# Patient Record
Sex: Female | Born: 1965
Health system: Southern US, Community
[De-identification: ages and names within clinical notes are randomized; demographics above are authoritative.]

## PROBLEM LIST (undated history)

## (undated) DIAGNOSIS — N179 Acute kidney failure, unspecified: Secondary | ICD-10-CM

## (undated) DIAGNOSIS — L299 Pruritus, unspecified: Secondary | ICD-10-CM

## (undated) DIAGNOSIS — S62102A Fracture of unspecified carpal bone, left wrist, initial encounter for closed fracture: Secondary | ICD-10-CM

## (undated) DIAGNOSIS — G8194 Hemiplegia, unspecified affecting left nondominant side: Secondary | ICD-10-CM

## (undated) DIAGNOSIS — I639 Cerebral infarction, unspecified: Secondary | ICD-10-CM

## (undated) DIAGNOSIS — K5901 Slow transit constipation: Secondary | ICD-10-CM

## (undated) HISTORY — DX: Acute kidney failure, unspecified: N17.9

## (undated) HISTORY — DX: Slow transit constipation: K59.01

## (undated) HISTORY — DX: Hemiplegia, unspecified affecting left nondominant side: G81.94

---

## 2017-02-28 ENCOUNTER — Inpatient Hospital Stay (HOSPITAL_COMMUNITY)
Admission: EM | Admit: 2017-02-28 | Discharge: 2017-03-05 | DRG: 041 | Disposition: A | Discharge status: Rehabilitation Facility | Payer: 59 | Attending: Family Medicine | Admitting: Family Medicine

## 2017-02-28 ENCOUNTER — Emergency Department (HOSPITAL_COMMUNITY): Payer: 59

## 2017-02-28 ENCOUNTER — Encounter (HOSPITAL_COMMUNITY): Payer: Self-pay | Admitting: Emergency Medicine

## 2017-02-28 ENCOUNTER — Inpatient Hospital Stay (HOSPITAL_COMMUNITY): Payer: 59

## 2017-02-28 DIAGNOSIS — I639 Cerebral infarction, unspecified: Secondary | ICD-10-CM | POA: Diagnosis present

## 2017-02-28 DIAGNOSIS — Z8249 Family history of ischemic heart disease and other diseases of the circulatory system: Secondary | ICD-10-CM | POA: Diagnosis not present

## 2017-02-28 DIAGNOSIS — K5900 Constipation, unspecified: Secondary | ICD-10-CM | POA: Diagnosis not present

## 2017-02-28 DIAGNOSIS — R1310 Dysphagia, unspecified: Secondary | ICD-10-CM | POA: Diagnosis present

## 2017-02-28 DIAGNOSIS — K5901 Slow transit constipation: Secondary | ICD-10-CM | POA: Diagnosis not present

## 2017-02-28 DIAGNOSIS — I69398 Other sequelae of cerebral infarction: Secondary | ICD-10-CM | POA: Diagnosis not present

## 2017-02-28 DIAGNOSIS — R414 Neurologic neglect syndrome: Secondary | ICD-10-CM | POA: Diagnosis present

## 2017-02-28 DIAGNOSIS — I63311 Cerebral infarction due to thrombosis of right middle cerebral artery: Secondary | ICD-10-CM | POA: Diagnosis present

## 2017-02-28 DIAGNOSIS — R402142 Coma scale, eyes open, spontaneous, at arrival to emergency department: Secondary | ICD-10-CM | POA: Diagnosis present

## 2017-02-28 DIAGNOSIS — R269 Unspecified abnormalities of gait and mobility: Secondary | ICD-10-CM | POA: Diagnosis not present

## 2017-02-28 DIAGNOSIS — E785 Hyperlipidemia, unspecified: Secondary | ICD-10-CM

## 2017-02-28 DIAGNOSIS — R402362 Coma scale, best motor response, obeys commands, at arrival to emergency department: Secondary | ICD-10-CM | POA: Diagnosis present

## 2017-02-28 DIAGNOSIS — I6389 Other cerebral infarction: Secondary | ICD-10-CM | POA: Diagnosis not present

## 2017-02-28 DIAGNOSIS — N179 Acute kidney failure, unspecified: Secondary | ICD-10-CM | POA: Diagnosis not present

## 2017-02-28 DIAGNOSIS — I10 Essential (primary) hypertension: Secondary | ICD-10-CM | POA: Diagnosis present

## 2017-02-28 DIAGNOSIS — G8194 Hemiplegia, unspecified affecting left nondominant side: Secondary | ICD-10-CM | POA: Diagnosis present

## 2017-02-28 DIAGNOSIS — R531 Weakness: Secondary | ICD-10-CM | POA: Diagnosis present

## 2017-02-28 DIAGNOSIS — R402252 Coma scale, best verbal response, oriented, at arrival to emergency department: Secondary | ICD-10-CM | POA: Diagnosis present

## 2017-02-28 DIAGNOSIS — R4189 Other symptoms and signs involving cognitive functions and awareness: Secondary | ICD-10-CM | POA: Diagnosis present

## 2017-02-28 DIAGNOSIS — I63511 Cerebral infarction due to unspecified occlusion or stenosis of right middle cerebral artery: Secondary | ICD-10-CM | POA: Diagnosis not present

## 2017-02-28 DIAGNOSIS — L299 Pruritus, unspecified: Secondary | ICD-10-CM | POA: Diagnosis present

## 2017-02-28 DIAGNOSIS — M7989 Other specified soft tissue disorders: Secondary | ICD-10-CM | POA: Diagnosis not present

## 2017-02-28 DIAGNOSIS — Q211 Atrial septal defect: Secondary | ICD-10-CM

## 2017-02-28 DIAGNOSIS — R29810 Facial weakness: Secondary | ICD-10-CM | POA: Diagnosis present

## 2017-02-28 DIAGNOSIS — H53462 Homonymous bilateral field defects, left side: Secondary | ICD-10-CM | POA: Diagnosis present

## 2017-02-28 DIAGNOSIS — I69319 Unspecified symptoms and signs involving cognitive functions following cerebral infarction: Secondary | ICD-10-CM

## 2017-02-28 DIAGNOSIS — R29715 NIHSS score 15: Secondary | ICD-10-CM | POA: Diagnosis present

## 2017-02-28 DIAGNOSIS — E119 Type 2 diabetes mellitus without complications: Secondary | ICD-10-CM | POA: Diagnosis present

## 2017-02-28 DIAGNOSIS — I69354 Hemiplegia and hemiparesis following cerebral infarction affecting left non-dominant side: Secondary | ICD-10-CM | POA: Diagnosis not present

## 2017-02-28 DIAGNOSIS — Z603 Acculturation difficulty: Secondary | ICD-10-CM | POA: Diagnosis not present

## 2017-02-28 DIAGNOSIS — E139 Other specified diabetes mellitus without complications: Secondary | ICD-10-CM | POA: Diagnosis not present

## 2017-02-28 HISTORY — DX: Pruritus, unspecified: L29.9

## 2017-02-28 HISTORY — DX: Cerebral infarction, unspecified: I63.9

## 2017-02-28 HISTORY — DX: Fracture of unspecified carpal bone, left wrist, initial encounter for closed fracture: S62.102A

## 2017-02-28 LAB — URINALYSIS, ROUTINE W REFLEX MICROSCOPIC
BILIRUBIN URINE: NEGATIVE
Glucose, UA: NEGATIVE mg/dL
KETONES UR: 5 mg/dL — AB
LEUKOCYTES UA: NEGATIVE
Nitrite: NEGATIVE
PROTEIN: 100 mg/dL — AB
Specific Gravity, Urine: >1.046 — ABNORMAL HIGH (ref 1.005–1.030)
pH: 7 (ref 5.0–8.0)

## 2017-02-28 LAB — COMPREHENSIVE METABOLIC PANEL
ALT: 16 U/L (ref 14–54)
ANION GAP: 13 (ref 5–15)
AST: 21 U/L (ref 15–41)
Albumin: 3.8 g/dL (ref 3.5–5.0)
Alkaline Phosphatase: 76 U/L (ref 38–126)
BILIRUBIN TOTAL: 0.6 mg/dL (ref 0.3–1.2)
BUN: 10 mg/dL (ref 6–20)
CO2: 24 mmol/L (ref 22–32)
Calcium: 9.6 mg/dL (ref 8.9–10.3)
Chloride: 102 mmol/L (ref 101–111)
Creatinine, Ser: 1.02 mg/dL — ABNORMAL HIGH (ref 0.44–1.00)
Glucose, Bld: 193 mg/dL — ABNORMAL HIGH (ref 65–99)
POTASSIUM: 4.4 mmol/L (ref 3.5–5.1)
Sodium: 139 mmol/L (ref 135–145)
TOTAL PROTEIN: 8 g/dL (ref 6.5–8.1)

## 2017-02-28 LAB — DIFFERENTIAL
Basophils Absolute: 0 10*3/uL (ref 0.0–0.1)
Basophils Relative: 0 %
EOS ABS: 0 10*3/uL (ref 0.0–0.7)
EOS PCT: 0 %
LYMPHS ABS: 1.8 10*3/uL (ref 0.7–4.0)
Lymphocytes Relative: 19 %
Monocytes Absolute: 0.4 10*3/uL (ref 0.1–1.0)
Monocytes Relative: 4 %
Neutro Abs: 7.3 10*3/uL (ref 1.7–7.7)
Neutrophils Relative %: 77 %

## 2017-02-28 LAB — I-STAT CHEM 8, ED
BUN: 15 mg/dL (ref 6–20)
Calcium, Ion: 1.07 mmol/L — ABNORMAL LOW (ref 1.15–1.40)
Chloride: 104 mmol/L (ref 101–111)
Creatinine, Ser: 0.9 mg/dL (ref 0.44–1.00)
Glucose, Bld: 194 mg/dL — ABNORMAL HIGH (ref 65–99)
HEMATOCRIT: 52 % — AB (ref 36.0–46.0)
HEMOGLOBIN: 17.7 g/dL — AB (ref 12.0–15.0)
Potassium: 5.2 mmol/L — ABNORMAL HIGH (ref 3.5–5.1)
SODIUM: 139 mmol/L (ref 135–145)
TCO2: 27 mmol/L (ref 22–32)

## 2017-02-28 LAB — CBC
HCT: 49.9 % — ABNORMAL HIGH (ref 36.0–46.0)
Hemoglobin: 16.1 g/dL — ABNORMAL HIGH (ref 12.0–15.0)
MCH: 25 pg — AB (ref 26.0–34.0)
MCHC: 32.3 g/dL (ref 30.0–36.0)
MCV: 77.4 fL — ABNORMAL LOW (ref 78.0–100.0)
Platelets: 221 10*3/uL (ref 150–400)
RBC: 6.45 MIL/uL — ABNORMAL HIGH (ref 3.87–5.11)
RDW: 14.4 % (ref 11.5–15.5)
WBC: 9.5 10*3/uL (ref 4.0–10.5)

## 2017-02-28 LAB — RAPID URINE DRUG SCREEN, HOSP PERFORMED
Amphetamines: NOT DETECTED
Barbiturates: NOT DETECTED
Benzodiazepines: NOT DETECTED
COCAINE: NOT DETECTED
OPIATES: NOT DETECTED
Tetrahydrocannabinol: NOT DETECTED

## 2017-02-28 LAB — PROTIME-INR
INR: 0.96
PROTHROMBIN TIME: 12.6 s (ref 11.4–15.2)

## 2017-02-28 LAB — ETHANOL: Alcohol, Ethyl (B): <10 mg/dL (ref <10)

## 2017-02-28 LAB — I-STAT TROPONIN, ED: TROPONIN I, POC: 0 ng/mL (ref 0.00–0.08)

## 2017-02-28 LAB — I-STAT BETA HCG BLOOD, ED (MC, WL, AP ONLY): I-stat hCG, quantitative: 6.6 m[IU]/mL — ABNORMAL HIGH (ref <5)

## 2017-02-28 LAB — HCG, QUANTITATIVE, PREGNANCY: HCG, BETA CHAIN, QUANT, S: 5 m[IU]/mL — AB (ref <5)

## 2017-02-28 LAB — APTT: aPTT: 33 seconds (ref 24–36)

## 2017-02-28 MED ORDER — ATORVASTATIN CALCIUM 80 MG PO TABS
80.0000 mg | ORAL_TABLET | Freq: Every day | ORAL | Status: DC
  Administered 2017-03-01 – 2017-03-05 (×5): 80 mg via ORAL
  Filled 2017-02-28 (×5): qty 1

## 2017-02-28 MED ORDER — ACETAMINOPHEN 650 MG RE SUPP
650.0000 mg | Freq: Four times a day (QID) | RECTAL | Status: DC | PRN
  Administered 2017-02-28 – 2017-03-01 (×3): 650 mg via RECTAL
  Filled 2017-02-28 (×3): qty 1

## 2017-02-28 MED ORDER — ONDANSETRON HCL 4 MG PO TABS: 4.0000 mg | ORAL_TABLET | Freq: Four times a day (QID) | ORAL | Status: DC | PRN

## 2017-02-28 MED ORDER — ASPIRIN 300 MG RE SUPP
300.0000 mg | Freq: Every day | RECTAL | Status: DC
  Filled 2017-02-28: qty 1

## 2017-02-28 MED ORDER — ENOXAPARIN SODIUM 30 MG/0.3ML ~~LOC~~ SOLN
30.0000 mg | SUBCUTANEOUS | Status: DC
  Administered 2017-02-28 – 2017-03-05 (×6): 30 mg via SUBCUTANEOUS
  Filled 2017-02-28 (×6): qty 0.3

## 2017-02-28 MED ORDER — ONDANSETRON HCL 4 MG/2ML IJ SOLN: 4.0000 mg | Freq: Four times a day (QID) | INTRAMUSCULAR | Status: DC | PRN

## 2017-02-28 MED ORDER — IOPAMIDOL (ISOVUE-370) INJECTION 76%
INTRAVENOUS | Status: AC
  Administered 2017-02-28: 90 mL
  Filled 2017-02-28: qty 100

## 2017-02-28 MED ORDER — ASPIRIN 325 MG PO TABS
325.0000 mg | ORAL_TABLET | Freq: Every day | ORAL | Status: DC
  Administered 2017-03-01 – 2017-03-04 (×4): 325 mg via ORAL
  Filled 2017-02-28 (×5): qty 1

## 2017-02-28 MED ORDER — ACETAMINOPHEN 325 MG PO TABS
650.0000 mg | ORAL_TABLET | Freq: Four times a day (QID) | ORAL | Status: DC | PRN
  Administered 2017-03-01 (×2): 650 mg via ORAL
  Filled 2017-02-28 (×2): qty 2

## 2017-02-28 MED ORDER — STROKE: EARLY STAGES OF RECOVERY BOOK
Freq: Once | Status: AC
  Administered 2017-02-28: 12:00:00
  Filled 2017-02-28: qty 1

## 2017-02-28 NOTE — ED Provider Notes (Signed)
[facility]ENC[facility] Provider Note   CSN: [id number] Arrival date & time: 02/28/17  0841     History   Chief Complaint Chief Complaint  Patient presents with  . Code Stroke    HPI Kristen Short is a 52 y.o. female.  Patient [place] in the [place] area.  Parent complaining of headache on Saturday.  Brought up to this area because she is moderately hard be it needs to sent.  They got worried that something was wrong so she was brought up here.  They realize that she had no movement to her left side.  Family states that apparently everything was okay until 3:00 yesterday afternoon.  Patient will not look to the left side.  Patient not able to walk.  Patient is able to verbalize.  Using family as Nurse, learning disabilitytranslator.  There is no [employer] engine[employer] translator for their language.      History reviewed. No pertinent past medical history.  There are no active problems to display for this patient.   History reviewed. No pertinent surgical history.  OB History    No data available       Home Medications    Prior to Admission medications   Medication Sig Start Date End Date Taking? Authorizing Provider  acetaminophen (TYLENOL) 500 MG tablet Take 500 mg by mouth every 6 (six) hours as needed for mild pain.   Yes [provider]    Family History No family history on file.  Social History Social History   Tobacco Use  . Smoking status: Not on file  Substance Use Topics  . Alcohol use: Not on file  . Drug use: Not on file     Allergies   Patient has no known allergies.   Review of Systems Review of Systems  Unable to perform ROS: Acuity of condition     Physical Exam Updated Vital Signs BP (!) 150/81   Pulse 92   Temp 98.3 F (36.8 C)   Resp 16   LMP 01/29/2017 Comment: entire abdomen shield  SpO2 97%   Physical Exam  Constitutional: She appears well-developed and well-nourished. No distress.  HENT:  Head: Normocephalic and  atraumatic.  Mouth/Throat: Oropharynx is clear and moist.  Eyes: Conjunctivae are normal. Pupils are equal, round, and reactive to light.  Best we can tell no vision to the left side.  Patient will not track her eyes to the left side.  Vision and eyes movement intact to the right.  Neck: Normal range of motion. Neck supple.  Cardiovascular: Normal rate, regular rhythm and normal heart sounds.  Pulmonary/Chest: Effort normal and breath sounds normal. No respiratory distress.  Abdominal: Soft. Bowel sounds are normal. There is no tenderness.  Musculoskeletal: Normal range of motion. She exhibits no edema.  Neurological: She is alert.  Patient with no movement to left arm.  Minimal movement to left leg.  Facial droop.  Left-sided neglect.  Patient able to toe verbalize.  Right side motor strength intact and normal.  Skin: Skin is warm. Capillary refill takes less than 2 seconds.  Nursing note and vitals reviewed.    ED Treatments / Results  Labs (all labs ordered are listed, but only abnormal results are displayed) Labs Reviewed  I-STAT CHEM 8, ED - Abnormal; Notable for the following components:      Result Value   Potassium 5.2 (*)    Glucose, Bld 194 (*)    Calcium, Ion 1.07 (*)    Hemoglobin 17.7 (*)  HCT 52.0 (*)    All other components within normal limits  I-STAT BETA HCG BLOOD, ED ([facility], [facility], [facility] ONLY) - Abnormal; Notable for the following components:   I-stat hCG, quantitative 6.6 (*)    All other components within normal limits  ETHANOL  PROTIME-INR  APTT  CBC  DIFFERENTIAL  COMPREHENSIVE METABOLIC PANEL  RAPID URINE DRUG SCREEN, HOSP PERFORMED  URINALYSIS, ROUTINE W REFLEX MICROSCOPIC  HCG, QUANTITATIVE, PREGNANCY  I-STAT TROPONIN, ED    EKG  EKG Interpretation  Date/Time:  Wednesday February 28 2017 09:55:43 EST Ventricular Rate:  96 PR Interval:    QRS Duration: 93 QT Interval:  350 QTC Calculation: 443 R Axis:   76 Text Interpretation:  Sinus rhythm  Borderline prolonged PR interval Borderline repolarization abnormality No previous ECGs available Confirmed by Vanetta Mulders [phone]) on 02/28/2017 10:00:04 AM       Radiology Ct Angio Head W Or Wo Contrast  Result Date: 02/28/2017 CLINICAL DATA:  51 year old female, code stroke. Left side paralysis, unknown time of onset. EXAM: CT ANGIOGRAPHY HEAD AND NECK CT PERFUSION BRAIN TECHNIQUE: Multidetector CT imaging of the head and neck was performed using the standard protocol during bolus administration of intravenous contrast. Multiplanar CT image reconstructions and MIPs were obtained to evaluate the vascular anatomy. Carotid stenosis measurements (when applicable) are obtained utilizing NASCET criteria, using the distal internal carotid diameter as the denominator. Multiphase CT imaging of the brain was performed following IV bolus contrast injection. Subsequent parametric perfusion maps were calculated using RAPID software. CONTRAST:  90mL ISOVUE-370 IOPAMIDOL (ISOVUE-370) INJECTION 76% COMPARISON:  Head CT without contrast 0935 hr today. FINDINGS: CT Brain Perfusion Findings: CBF (<30%) Volume: 7 mL, but underestimated due to hypodense areas of core infarct. Perfusion (Tmax>6.0s) volume: 0 milliliters Mismatch Volume: Not applicable Infarction Location:Right hemisphere. CTA NECK Skeleton: No acute osseous abnormality identified. Upper chest: Mild gas trapping in the upper lobes. No superior mediastinal lymphadenopathy. Other neck: Negative.  No cervical lymphadenopathy. Aortic arch: 3 vessel arch configuration. Mild motion artifact. No arch atherosclerosis or great vessel origin stenosis suspected. Right carotid system: Mild motion artifact at the right CCA origin without stenosis. No atherosclerosis identified in the cervical right carotid. Widely patent right ICA origin and bulb. Left carotid system: Mild motion artifact at the left CCA origin. Minimal if any atherosclerosis in the left cervical  carotid. Widely patent left ICA origin and bulb. Vertebral arteries: No proximal right subclavian artery or right vertebral artery origin plaque or stenosis. Patent right vertebral artery to the skull base without stenosis. Mild motion artifact at the proximal subclavian artery. Normal left vertebral artery origin. Tortuous left V1 segment. Fairly codominant vertebral arteries in the neck. No plaque or stenosis in the left vertebral artery to the skull base. CTA HEAD Posterior circulation: Codominant distal vertebral arteries are patent to the vertebrobasilar junction without stenosis. Patent PICA origins. Patent basilar artery without stenosis. Normal SCA and PCA origins. Posterior communicating arteries are diminutive or absent. Bilateral PCA branches are symmetric and within normal limits. Anterior circulation: The left ICA siphon is patent with mild to moderate calcified plaque in the cavernous and supraclinoid ICA segments. Possible mild left supraclinoid segment stenosis. Patent left ICA terminus. Calcified plaque plus soft plaque or thrombus in the right ICA siphon. Short segment (3 millimeter) occlusion of the distal cavernous right ICA (series 11, image 91) with reconstituted flow at the right anterior genu and patent right supraclinoid segment. Superimposed mild to moderate right siphon atherosclerotic stenosis. Patent right  ICA terminus. Normal MCA origins. Questionable moderate stenosis of the right ACA origin. Bilateral ACA branches are within normal limits. Left MCA M1 segment, bifurcation, and left MCA branches are within normal limits. Right MCA, M1 segment bifurcation, and right MCA branches are within normal limits. No right MCA branch occlusion identified. Venous sinuses: Arterial dominant phase imaging. Grossly patent major dural venous sinuses. Anatomic variants: None. Review of the MIP images confirms the above findings IMPRESSION: 1. Positive for large vessel occlusion of the right ICA siphon:  there is a short 3 mm distal cavernous segment of right ICA thrombosis with reconstitution. Patent right ICA terminus and right MCA. 2. Right MCA core infarct volume underestimated by CT Perfusion, suggesting subacute ischemic timing. 3. Moderate bilateral ICA siphon atherosclerosis associated with #1. But no carotid atherosclerosis or stenosis in the neck. And no posterior circulation atherosclerosis or stenosis. 4. Study was reviewed in person with Dr. Milon Dikes on 02/28/2017 beginning at 1000 hrs. Electronically Signed   By: Odessa Fleming M.D.   On: 02/28/2017 10:19   Ct Angio Neck W Or Wo Contrast  Result Date: 02/28/2017 CLINICAL DATA:  52 year old female, code stroke. Left side paralysis, unknown time of onset. EXAM: CT ANGIOGRAPHY HEAD AND NECK CT PERFUSION BRAIN TECHNIQUE: Multidetector CT imaging of the head and neck was performed using the standard protocol during bolus administration of intravenous contrast. Multiplanar CT image reconstructions and MIPs were obtained to evaluate the vascular anatomy. Carotid stenosis measurements (when applicable) are obtained utilizing NASCET criteria, using the distal internal carotid diameter as the denominator. Multiphase CT imaging of the brain was performed following IV bolus contrast injection. Subsequent parametric perfusion maps were calculated using RAPID software. CONTRAST:  90mL ISOVUE-370 IOPAMIDOL (ISOVUE-370) INJECTION 76% COMPARISON:  Head CT without contrast 0935 hr today. FINDINGS: CT Brain Perfusion Findings: CBF (<30%) Volume: 7 mL, but underestimated due to hypodense areas of core infarct. Perfusion (Tmax>6.0s) volume: 0 milliliters Mismatch Volume: Not applicable Infarction Location:Right hemisphere. CTA NECK Skeleton: No acute osseous abnormality identified. Upper chest: Mild gas trapping in the upper lobes. No superior mediastinal lymphadenopathy. Other neck: Negative.  No cervical lymphadenopathy. Aortic arch: 3 vessel arch configuration. Mild  motion artifact. No arch atherosclerosis or great vessel origin stenosis suspected. Right carotid system: Mild motion artifact at the right CCA origin without stenosis. No atherosclerosis identified in the cervical right carotid. Widely patent right ICA origin and bulb. Left carotid system: Mild motion artifact at the left CCA origin. Minimal if any atherosclerosis in the left cervical carotid. Widely patent left ICA origin and bulb. Vertebral arteries: No proximal right subclavian artery or right vertebral artery origin plaque or stenosis. Patent right vertebral artery to the skull base without stenosis. Mild motion artifact at the proximal subclavian artery. Normal left vertebral artery origin. Tortuous left V1 segment. Fairly codominant vertebral arteries in the neck. No plaque or stenosis in the left vertebral artery to the skull base. CTA HEAD Posterior circulation: Codominant distal vertebral arteries are patent to the vertebrobasilar junction without stenosis. Patent PICA origins. Patent basilar artery without stenosis. Normal SCA and PCA origins. Posterior communicating arteries are diminutive or absent. Bilateral PCA branches are symmetric and within normal limits. Anterior circulation: The left ICA siphon is patent with mild to moderate calcified plaque in the cavernous and supraclinoid ICA segments. Possible mild left supraclinoid segment stenosis. Patent left ICA terminus. Calcified plaque plus soft plaque or thrombus in the right ICA siphon. Short segment (3 millimeter) occlusion of the distal cavernous  right ICA (series 11, image 91) with reconstituted flow at the right anterior genu and patent right supraclinoid segment. Superimposed mild to moderate right siphon atherosclerotic stenosis. Patent right ICA terminus. Normal MCA origins. Questionable moderate stenosis of the right ACA origin. Bilateral ACA branches are within normal limits. Left MCA M1 segment, bifurcation, and left MCA branches are  within normal limits. Right MCA, M1 segment bifurcation, and right MCA branches are within normal limits. No right MCA branch occlusion identified. Venous sinuses: Arterial dominant phase imaging. Grossly patent major dural venous sinuses. Anatomic variants: None. Review of the MIP images confirms the above findings IMPRESSION: 1. 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 Patent right ICA terminus and right MCA. 2. Right MCA core infarct volume underestimated by CT Perfusion, suggesting subacute ischemic timing. 3. Moderate bilateral ICA siphon atherosclerosis associated with #1. But no carotid atherosclerosis or stenosis in the neck. And no posterior circulation atherosclerosis or stenosis. 4. Study was reviewed in person with Dr. Milon Dikes on 02/28/2017 beginning at 1000 hrs. Electronically Signed   By: Odessa Fleming M.D.   On: 02/28/2017 10:19   Ct Cerebral Perfusion W Contrast  Result Date: 02/28/2017 CLINICAL DATA:  52 year old female, code stroke. Left side paralysis, unknown time of onset. EXAM: CT ANGIOGRAPHY HEAD AND NECK CT PERFUSION BRAIN TECHNIQUE: Multidetector CT imaging of the head and neck was performed using the standard protocol during bolus administration of intravenous contrast. Multiplanar CT image reconstructions and MIPs were obtained to evaluate the vascular anatomy. Carotid stenosis measurements (when applicable) are obtained utilizing NASCET criteria, using the distal internal carotid diameter as the denominator. Multiphase CT imaging of the brain was performed following IV bolus contrast injection. Subsequent parametric perfusion maps were calculated using RAPID software. CONTRAST:  90mL ISOVUE-370 IOPAMIDOL (ISOVUE-370) INJECTION 76% COMPARISON:  Head CT without contrast 0935 hr today. FINDINGS: CT Brain Perfusion Findings: CBF (<30%) Volume: 7 mL, but underestimated due to  hypodense areas of core infarct. Perfusion (Tmax>6.0s) volume: 0 milliliters Mismatch Volume: Not applicable Infarction Location:Right hemisphere. CTA NECK Skeleton: No acute osseous abnormality identified. Upper chest: Mild gas trapping in the upper lobes. No superior mediastinal lymphadenopathy. Other neck: Negative.  No cervical lymphadenopathy. Aortic arch: 3 vessel arch configuration. Mild motion artifact. No arch atherosclerosis or great vessel origin stenosis suspected. Right carotid system: Mild motion artifact at the right CCA origin without stenosis. No atherosclerosis identified in the cervical right carotid. Widely patent right ICA origin and bulb. Left carotid system: Mild motion artifact at the left CCA origin. Minimal if any atherosclerosis in the left cervical carotid. Widely patent left ICA origin and bulb. Vertebral arteries: No proximal right subclavian artery or right vertebral artery origin plaque or stenosis. Patent right vertebral artery to the skull base without stenosis. Mild motion artifact at the proximal subclavian artery. Normal left vertebral artery origin. Tortuous left V1 segment. Fairly codominant vertebral arteries in the neck. No plaque or stenosis in the left vertebral artery to the skull base. CTA HEAD Posterior circulation: Codominant distal vertebral arteries are patent to the vertebrobasilar junction without stenosis. Patent PICA origins. Patent basilar artery without stenosis. Normal SCA and PCA origins. Posterior communicating arteries are diminutive or absent. Bilateral PCA branches are symmetric and within normal limits. Anterior circulation: The left ICA siphon is patent with mild to moderate calcified plaque in the cavernous and supraclinoid ICA segments. Possible mild left supraclinoid  segment stenosis. Patent left ICA terminus. Calcified plaque plus soft plaque or thrombus in the right ICA siphon. Short segment (3 millimeter) occlusion of the distal cavernous right ICA  (series 11, image 91) with reconstituted flow at the right anterior genu and patent right supraclinoid segment. Superimposed mild to moderate right siphon atherosclerotic stenosis. Patent right ICA terminus. Normal MCA origins. Questionable moderate stenosis of the right ACA origin. Bilateral ACA branches are within normal limits. Left MCA M1 segment, bifurcation, and left MCA branches are within normal limits. Right MCA, M1 segment bifurcation, and right MCA branches are within normal limits. No right MCA branch occlusion identified. Venous sinuses: Arterial dominant phase imaging. Grossly patent major dural venous sinuses. Anatomic variants: None. Review of the MIP images confirms the above findings IMPRESSION: 1. 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 Patent right ICA terminus and right MCA. 2. Right MCA core infarct volume underestimated by CT Perfusion, suggesting subacute ischemic timing. 3. Moderate bilateral ICA siphon atherosclerosis associated with #1. But no carotid atherosclerosis or stenosis in the neck. And no posterior circulation atherosclerosis or stenosis. 4. Study was reviewed in person with Dr. Milon Dikes on 02/28/2017 beginning at 1000 hrs. Electronically Signed   By: Odessa Fleming M.D.   On: 02/28/2017 10:19   Ct Head Code Stroke Wo Contrast  Result Date: 02/28/2017 CLINICAL DATA:  Code stroke. 52 year old female with left side paralysis. Unknown time of onset. EXAM: CT HEAD WITHOUT CONTRAST TECHNIQUE: Contiguous axial images were obtained from the base of the skull through the vertex without intravenous contrast. COMPARISON:  None. FINDINGS: Brain: Multifocal confluent areas of gray and white matter hypodensity are scattered in the right hemisphere, with wedge-shaped peripheral areas of involvement in the right middle frontal gyrus, right parietal lobe, and superior right occipital  lobe. Additional smaller areas of involvement including the superior right peri-Rolandic cortex (series 3, image 25). No acute intracranial hemorrhage identified. No associated intracranial mass effect. Gray-white matter differentiation in the right temporal lobe and right basal ganglia is preserved. Left hemisphere, thalamic, and posterior fossa gray-white matter differentiation appears normal. No ventriculomegaly.  Normal basilar cisterns. Vascular: Calcified atherosclerosis at the skull base. No suspicious intracranial vascular hyperdensity. Skull: Negative. Sinuses/Orbits: Clear. Other: Visualized orbits and scalp soft tissues are within normal limits. ASPECTS [facility] Stroke Program Early CT Score) - Ganglionic level infarction (caudate, lentiform nuclei, internal capsule, insula, M1-M3 cortex): 6 (abnormal M1 segment) - Supraganglionic infarction (M4-M6 cortex): 0 (M4 through and 6 abnormal). Total score (0-10 with 10 being normal): 6 IMPRESSION: 1. Multifocal subacute appearing cortical infarcts in the right hemisphere, primarily the right MCA territory although there is also superior right occipital lobe involvement. The right basal ganglia are spared. 2. No associated hemorrhage or intracranial mass effect. 3. ASPECTS is 6. 4. These results were communicated to Dr. Wilford Corner at 9:46 amon 1/23/2019by text page via the [facility] messaging system. Electronically Signed   By: Odessa Fleming M.D.   On: 02/28/2017 09:47    Procedures Procedures (including critical care time)  CRITICAL CARE Performed by: Vanetta Mulders Total critical care time: 45 minutes Critical care time was exclusive of separately billable procedures and treating other patients. Critical care was necessary to treat or prevent imminent or life-threatening deterioration. Critical care was time spent personally by me on the following activities: development of treatment plan with patient and/or surrogate as well as nursing, discussions with  consultants, evaluation of patient's response to treatment, examination of patient, obtaining history from patient or surrogate, ordering and performing treatments and interventions, ordering and review of laboratory studies, ordering and review of radiographic studies, pulse oximetry and re-evaluation of patient's condition.    Medications Ordered in ED Medications  iopamidol (ISOVUE-370) 76 % injection (90 mLs  Contrast Given 02/28/17 0945)     Initial Impression / Assessment and Plan / ED Course  I have reviewed the triage vital signs and the nursing notes.  Pertinent labs & imaging results that were available during my care of the patient were reviewed by me and considered in my medical decision making (see chart for details).    Initially patient's history from family members.  Based on her [place] language no translator other than family.  They felt that she was last seen normal at 3:00 yesterday afternoon.  Initially there was some concern about a complaint of headache just starting on Saturday.  But otherwise was all right.  The patient presented here with evidence of large vessel stroke.  Patient had left-sided weakness neglect to the left side.  No vision left side.   Code stroke was activated.  Because of [place].  CT showed large right MCA infarct but it did appear subacute.  Probably occurred around Saturday.  Patient had CT angios done and CT perfusion done which kind of confirmed these findings.  There was involvement of the internal carotid artery.  Patient stable.  Chest x-ray added on because of her background.  Discussed with medicine teaching service they will come see patient and admit.  Patient's potassium was high on initial blood work.  This could be some degree of hemolysis renal functions normal.  Also her i-STAT pregnancy test was slightly elevated this is usually a false positive.  Quantitative hCG sent.  Patient's blood pressure here slightly  elevated systolics 150.   Final Clinical Impressions(s) / ED Diagnoses   Final diagnoses:  Cerebrovascular accident (CVA) due to thrombosis of right middle cerebral artery [facility])    ED Discharge Orders    None       Vanetta Mulders, MD 02/28/17 1037

## 2017-02-28 NOTE — ED Triage Notes (Signed)
Pt here from home with c/o stroke symptoms that started yesterday around 3om when pt could not walk , pt also had a really bad h/a on sat

## 2017-02-28 NOTE — Consult Note (Signed)
Neurology Consultation  Reason for Consult: code stroke Referring Physician: Dr Deretha Emory  CC: LSW  History is obtained from: chart, family  HPI: Kristen Short is a 52 y.o. female with no known PMH, recent immigrant from [place] who lives in [place] and the rest of the family lives in [place], was brought in for evaluation of left-sided weakness. She had been complaining of a headache that started around Saturday.  She describes it as a very severe pressure-like headache all over her head. Over the next day or so, she started having some difficulty walking.  Yesterday it was noted that around 3 PM she could not walk and had severe weakness in her leg. For the symptoms, she was driven by family to [facility] and brought to [facility] for further evaluation. Upon arrival here in the ER, she had left facial weakness, left arm facility, left leg weakness and right gaze preference. A code stroke was called because initial information was that her last known well was 3 PM yesterday whereas on more detailed history taking, it seems like the symptoms have been ongoing at least since Sunday. No recent illnesses.  No fever chills.  No chest pain palpitations.  No shortness of breath.  No nausea vomiting abdominal pain diarrhea constipation.   LKW: 1500 hrs, 02/28/2017 is what was told at first but seems like it was sometime on Sunday, 02/25/2017 tpa given?: no, OSW Premorbid modified Rankin scale (mRS):0  ROS: Review of systems positives as documented in the HPI.  Rest of the review negative.  No past medical history on file.   No family history on file.   Social History:   has no tobacco, alcohol, and drug history on file.  Medications  Current Facility-Administered Medications:  .  iopamidol (ISOVUE-370) 76 % injection, , , ,   Current Outpatient Medications:  .  acetaminophen (TYLENOL) 500 MG tablet, Take 500 mg by mouth every 6 (six) hours as needed for mild pain., Disp: ,  Rfl:   Exam: Current vital signs: There were no vitals taken for this visit. Vital signs in last 24 hours:   General: Awake alert no distress HEENT: Normocephalic atraumatic LUNGS - Clear to auscultation bilaterally with no wheezes CV - S1S2 RRR, no m/r/g, equal pulses bilaterally. ABDOMEN - Soft, nontender, nondistended with normoactive BS Ext: warm, well perfused, intact peripheral pulses, no edema NEURO:  Mental Status: AA&Ox3  Language: speech is dysarthric.  Naming, repetition, fluency, and comprehension intact. Cranial nerves: Pupils equal round reactive to light, gaze preference to the right, able to get to midline but unable to cross midline to the left, left homonymous hemianopsia, left lower facial weakness at rest and on smiling, decreased sensation on the left face, tongue midline. Motor exam: 0/5 left upper extremity, 3/5 left lower extremity with vertical drift, 5/5 right upper and lower extremities. Sensory: Diminished sensation on the left hemibody to all modalities.  Extinction on double simultaneous stimulation. Cerebellar function: Intact finger-nose-finger on the right, intact heel-knee-shin on the right, unable to perform on the left. Gait testing was deferred NIHSS - 15  Labs I have reviewed labs in epic and the results pertinent to this consultation are: CBC    Component Value Date/Time   HGB 17.7 (H) 02/28/2017 0941   HCT 52.0 (H) 02/28/2017 0941   CMP     Component Value Date/Time   NA 139 02/28/2017 0941   K 5.2 (H) 02/28/2017 0941   CL 104 02/28/2017 0941   GLUCOSE 194 (  H) 02/28/2017 0941   BUN 15 02/28/2017 0941   CREATININE 0.90 02/28/2017 0941   Imaging I have reviewed the images obtained: CT-scan of the brain -subacute appearing strokes in the right MCA territory and right PCA territory as well.  Aspects 6.  No bleed. 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 CT perfusion  study shows a small cord with no area at risk-likely spuriously  Assessment:  52 year old [place] woman with no known past medical history brought in for evaluation of left-sided weakness that started Saturday or Sunday. Symptoms consistent with a right MCA territory stroke. NIH 15 on arrival.  Not a candidate for TPA as she is out of the window.  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  Acute ischemic stroke involving the right MCA and PCA territory-atheroembolic versus cardioembolic.  Recommendations: -Admit to hospitalist -Telemetry monitoring -Allow for permissive hypertension for the first 24-48h - only treat PRN if SBP >220 mmHg. Blood pressures can be gradually normalized to SBP<140 upon discharge. -MRI brain without contrast -Echocardiogram -HgbA1c, fasting lipid panel -Frequent neuro checks -Prophylactic therapy-Antiplatelet med: Aspirin - dose 325mg  PO or 300mg  PR -Atorvastatin 80 mg PO daily -Risk factor modification -PT consult, OT consult, Speech consult -If Afib found on telemetry, will need anticoagulation. Decision pending imaging and stroke team rounding.  Please page stroke NP/PA/MD (listed on AMION)  from 8am-4 pm as this patient will be followed by the stroke team at this point.  -- Milon DikesAshish Ziere Docken, MD [facility] Pager: [phone] If 7pm to 7am, please call on call as listed on AMION.  CRITICAL CARE ATTESTATION This patient is critically ill and at significant risk of neurological worsening, death and care requires constant monitoring of vital signs, hemodynamics,respiratory and cardiac monitoring. I spent 50  minutes of neurocritical care time performing neurological assessment, discussion with family, other specialists and medical decision making of high complexityin the care of  this patient.

## 2017-02-28 NOTE — ED Notes (Signed)
[facility] called to cancel code stroke per Dr. Laurence SlateAroor request

## 2017-02-28 NOTE — ED Notes (Signed)
Patient transported to CT 

## 2017-02-28 NOTE — H&P (Signed)
Family Medicine Teaching [facility] Admission History and Physical Service Pager: [phone]  Patient name: Kristen Short Medical record number: [id number] Date of birth: 08-05-1965 Age: 52 y.o. Gender: female  Primary Care Provider: Patient, No Pcp Per Consultants: Neuro Code Status: FULL  Chief Complaint: L-sided weakness  Assessment and Plan: Kristen Short is a 52 y.o. female presenting with L-sided weakness. No significant PMH.   L-sided weakness with R MCA stroke: CTA head/neck with acute ischemic stroke involving R MCA and PCA territory. Etiology currently unknown, as patient without any recent medical evaluation and thus unsure if patient has hyperlipidemia, arrhythmia, or any other risk factors. Did not receive tPA as out of treatment window on presentation. Significant LUE weakness (0/5 strength), with accompanying LLE weakness (3/5) and L-sided facial droop. 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 No significant lab findings. EKG sinus rhythm with borderline prolonged PR but no arrhythmias noted.  - Admit to FPTS, attending Dr. Jennette Kettle - Neuro following, appreciate recs - MRI brain w/o contrast - Echo - Telemetry - Lipid panel - A1C - Neuro checks q2 - NPO until swallow screen - Begin aspirin - Begin atorvastatin 80mg  qd per neuro recs - PT/OT/speech consult - Permissive HTN x24-48hrs (SBP <220) - Carotid dopplers not necessary as CTA neck performed - Will need to establish with PCP likely in [place] area upon discharge  FEN/GI: NPO until swallow screen, then heart healthy diet Prophylaxis: Lovenox  Disposition: admit to FPTS  History of Present Illness:  Kristen Short is a 52 y.o. female presenting with L-sided weakness.   History provided by patient's sister, as patient does not speak [place] (only speaks Montagnard). Family declined interpreter as sister speaks English moderately well.   5d ago, patient called her sister and said she had a  headache and subjective fever with chills. Went to church the next day without difficulty, but told her sister that her HA had not resolved. Sister spoke with patient again the following day, and patient said she was doing well, however the HA persisted. Yesterday patient called her sister in the AM and said she could not move her L arm. At 3PM yesterday patient called her sister again and said she could not move her L leg either and was unable to walk. Her sister then drove from [place] to [place], where patient lives, and brought her back to [place]. Sister was going to take patient to a clinic yesterday, but it was closed by the time they got back to [place]. Patient had not improved at all this AM, so sister brought her to [facility] ED.  Currently, patient says she is still unable to move her L arm. She endorses continued HA in R temporal region only with photosensitivity. Denies nausea currently but says she has had nausea associated with her HA. Took Tylenol last night which she said helped with her HA some. Family thinks that patient's speech is slurred currently. Patient says she has difficulty forming words because her mouth is weak, but is not having difficulty expressing what she wants to say.   In ED, patient was noted to have significant L-sided weakness. Head imaging was performed, which showed R MCA stroke. She was evaluated by neurologist, who felt she was appropriate for admission with further work-up to determine etiology. FPTS was consulted to further management.   Review Of Systems: Per HPI with the following additions:   Review of Systems  Constitutional: Positive for chills and fever (Subjective).  Eyes: Positive for  photophobia.  Respiratory: Positive for cough.   Cardiovascular: Negative for chest pain.  Gastrointestinal: Positive for nausea. Negative for abdominal pain and vomiting.  Neurological: Positive for focal weakness and headaches.    Patient Active Problem List    Diagnosis Date Noted  . CVA (cerebral vascular accident) (HCC) 02/28/2017    Past Medical History: History reviewed. No pertinent past medical history.  Past Surgical History: History reviewed. No pertinent surgical history.  Social History: Social History   Tobacco Use  . Smoking status: Not on file  Substance Use Topics  . Alcohol use: Not on file  . Drug use: Not on file   Additional social history: Patient lives in [place] with her husband and her 14yo son. Has a 25yo son who lives close by. Rest of her family lives in [place]. She is from [place] originally and speaks Montagnard.  Please also refer to relevant sections of EMR.  Family History: Both older sisters with HTN.   Allergies and Medications: No Known Allergies No current facility-administered medications on file prior to encounter.    Current Outpatient Medications on File Prior to Encounter  Medication Sig Dispense Refill  . acetaminophen (TYLENOL) 500 MG tablet Take 500 mg by mouth every 6 (six) hours as needed for mild pain.      Objective: BP (!) 148/80   Pulse 91   Temp 98.3 F (36.8 C)   Resp 18   LMP 01/29/2017 Comment: entire abdomen shield  SpO2 95%  Exam: General: initially sleeping but able to arouse easily; sitting in bed in NAD Eyes: pupils round though not reactive to light; difficulty following finger with eyes, unable to cross midline to L; R-sided gaze preference ENTM: decreased sensation to touch of L-face; significant L-sided facial droop Neck: supple Cardiovascular: RRR, no murmurs appreciated Respiratory: CTAB, normal WOB on RA Gastrointestinal: soft, non-tender, non-distended, +BS MSK: 0/5 strength LUE, 3/5 strength LLL; 5/5 strength RUE and RLE Derm: warm and dry, no rashes or bruises noted Neuro: able to state person, place, time; sensation to light touch diminished on L face and L extremities Psych: appropriate mood and affect  Labs and Imaging: CBC BMET  Recent  Labs  Lab 02/28/17 0919 02/28/17 0941  WBC 9.5  --   HGB 16.1* 17.7*  HCT 49.9* 52.0*  PLT 221  --    Recent Labs  Lab 02/28/17 0919 02/28/17 0941  NA 139 139  K 4.4 5.2*  CL 102 104  CO2 24  --   BUN 10 15  CREATININE 1.02* 0.90  GLUCOSE 193* 194*  CALCIUM 9.6  --      Ct Angio Head W Or Wo Contrast  Result Date: 02/28/2017 CLINICAL DATA:  52 year old female, code stroke. Left side paralysis, unknown time of onset. EXAM: CT ANGIOGRAPHY HEAD AND NECK CT PERFUSION BRAIN TECHNIQUE: Multidetector CT imaging of the head and neck was performed using the standard protocol during bolus administration of intravenous contrast. Multiplanar CT image reconstructions and MIPs were obtained to evaluate the vascular anatomy. Carotid stenosis measurements (when applicable) are obtained utilizing NASCET criteria, using the distal internal carotid diameter as the denominator. Multiphase CT imaging of the brain was performed following IV bolus contrast injection. Subsequent parametric perfusion maps were calculated using RAPID software. CONTRAST:  90mL ISOVUE-370 IOPAMIDOL (ISOVUE-370) INJECTION 76% COMPARISON:  Head CT without contrast 0935 hr today. FINDINGS: CT Brain Perfusion Findings: CBF (<30%) Volume: 7 mL, but underestimated due to hypodense areas of core infarct. Perfusion (Tmax>6.0s) volume: 0  milliliters Mismatch Volume: Not applicable Infarction Location:Right hemisphere. CTA NECK Skeleton: No acute osseous abnormality identified. Upper chest: Mild gas trapping in the upper lobes. No superior mediastinal lymphadenopathy. Other neck: Negative.  No cervical lymphadenopathy. Aortic arch: 3 vessel arch configuration. Mild motion artifact. No arch atherosclerosis or great vessel origin stenosis suspected. Right carotid system: Mild motion artifact at the right CCA origin without stenosis. No atherosclerosis identified in the cervical right carotid. Widely patent right ICA origin and bulb. Left carotid  system: Mild motion artifact at the left CCA origin. Minimal if any atherosclerosis in the left cervical carotid. Widely patent left ICA origin and bulb. Vertebral arteries: No proximal right subclavian artery or right vertebral artery origin plaque or stenosis. Patent right vertebral artery to the skull base without stenosis. Mild motion artifact at the proximal subclavian artery. Normal left vertebral artery origin. Tortuous left V1 segment. Fairly codominant vertebral arteries in the neck. No plaque or stenosis in the left vertebral artery to the skull base. CTA HEAD Posterior circulation: Codominant distal vertebral arteries are patent to the vertebrobasilar junction without stenosis. Patent PICA origins. Patent basilar artery without stenosis. Normal SCA and PCA origins. Posterior communicating arteries are diminutive or absent. Bilateral PCA branches are symmetric and within normal limits. Anterior circulation: The left ICA siphon is patent with mild to moderate calcified plaque in the cavernous and supraclinoid ICA segments. Possible mild left supraclinoid segment stenosis. Patent left ICA terminus. Calcified plaque plus soft plaque or thrombus in the right ICA siphon. Short segment (3 millimeter) occlusion of the distal cavernous right ICA (series 11, image 91) with reconstituted flow at the right anterior genu and patent right supraclinoid segment. Superimposed mild to moderate right siphon atherosclerotic stenosis. Patent right ICA terminus. Normal MCA origins. Questionable moderate stenosis of the right ACA origin. Bilateral ACA branches are within normal limits. Left MCA M1 segment, bifurcation, and left MCA branches are within normal limits. Right MCA, M1 segment bifurcation, and right MCA branches are within normal limits. No right MCA branch occlusion identified. Venous sinuses: Arterial dominant phase imaging. Grossly patent major dural venous sinuses. Anatomic variants: None. Review of the MIP  images confirms the above findings IMPRESSION: 1. 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 Patent right ICA terminus and right MCA. 2. Right MCA core infarct volume underestimated by CT Perfusion, suggesting subacute ischemic timing. 3. Moderate bilateral ICA siphon atherosclerosis associated with #1. But no carotid atherosclerosis or stenosis in the neck. And no posterior circulation atherosclerosis or stenosis. 4. Study was reviewed in person with Dr. Milon Dikes on 02/28/2017 beginning at 1000 hrs. Electronically Signed   By: Odessa Fleming M.D.   On: 02/28/2017 10:19   Ct Angio Neck W Or Wo Contrast  Result Date: 02/28/2017 CLINICAL DATA:  52 year old female, code stroke. Left side paralysis, unknown time of onset. EXAM: CT ANGIOGRAPHY HEAD AND NECK CT PERFUSION BRAIN TECHNIQUE: Multidetector CT imaging of the head and neck was performed using the standard protocol during bolus administration of intravenous contrast. Multiplanar CT image reconstructions and MIPs were obtained to evaluate the vascular anatomy. Carotid stenosis measurements (when applicable) are obtained utilizing NASCET criteria, using the distal internal carotid diameter as the denominator. Multiphase CT imaging of the brain was performed following IV bolus contrast injection. Subsequent parametric perfusion maps were calculated using RAPID software. CONTRAST:  90mL ISOVUE-370 IOPAMIDOL (ISOVUE-370) INJECTION 76% COMPARISON:  Head  CT without contrast 0935 hr today. FINDINGS: CT Brain Perfusion Findings: CBF (<30%) Volume: 7 mL, but underestimated due to hypodense areas of core infarct. Perfusion (Tmax>6.0s) volume: 0 milliliters Mismatch Volume: Not applicable Infarction Location:Right hemisphere. CTA NECK Skeleton: No acute osseous abnormality identified. Upper chest: Mild gas trapping in the upper lobes. No superior mediastinal  lymphadenopathy. Other neck: Negative.  No cervical lymphadenopathy. Aortic arch: 3 vessel arch configuration. Mild motion artifact. No arch atherosclerosis or great vessel origin stenosis suspected. Right carotid system: Mild motion artifact at the right CCA origin without stenosis. No atherosclerosis identified in the cervical right carotid. Widely patent right ICA origin and bulb. Left carotid system: Mild motion artifact at the left CCA origin. Minimal if any atherosclerosis in the left cervical carotid. Widely patent left ICA origin and bulb. Vertebral arteries: No proximal right subclavian artery or right vertebral artery origin plaque or stenosis. Patent right vertebral artery to the skull base without stenosis. Mild motion artifact at the proximal subclavian artery. Normal left vertebral artery origin. Tortuous left V1 segment. Fairly codominant vertebral arteries in the neck. No plaque or stenosis in the left vertebral artery to the skull base. CTA HEAD Posterior circulation: Codominant distal vertebral arteries are patent to the vertebrobasilar junction without stenosis. Patent PICA origins. Patent basilar artery without stenosis. Normal SCA and PCA origins. Posterior communicating arteries are diminutive or absent. Bilateral PCA branches are symmetric and within normal limits. Anterior circulation: The left ICA siphon is patent with mild to moderate calcified plaque in the cavernous and supraclinoid ICA segments. Possible mild left supraclinoid segment stenosis. Patent left ICA terminus. Calcified plaque plus soft plaque or thrombus in the right ICA siphon. Short segment (3 millimeter) occlusion of the distal cavernous right ICA (series 11, image 91) with reconstituted flow at the right anterior genu and patent right supraclinoid segment. Superimposed mild to moderate right siphon atherosclerotic stenosis. Patent right ICA terminus. Normal MCA origins. Questionable moderate stenosis of the right ACA  origin. Bilateral ACA branches are within normal limits. Left MCA M1 segment, bifurcation, and left MCA branches are within normal limits. Right MCA, M1 segment bifurcation, and right MCA branches are within normal limits. No right MCA branch occlusion identified. Venous sinuses: Arterial dominant phase imaging. Grossly patent major dural venous sinuses. Anatomic variants: None. Review of the MIP images confirms the above findings IMPRESSION: 1. 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 Patent right ICA terminus and right MCA. 2. Right MCA core infarct volume underestimated by CT Perfusion, suggesting subacute ischemic timing. 3. Moderate bilateral ICA siphon atherosclerosis associated with #1. But no carotid atherosclerosis or stenosis in the neck. And no posterior circulation atherosclerosis or stenosis. 4. Study was reviewed in person with Dr. Milon Dikes on 02/28/2017 beginning at 1000 hrs. Electronically Signed   By: Odessa Fleming M.D.   On: 02/28/2017 10:19   Ct Cerebral Perfusion W Contrast  Result Date: 02/28/2017 CLINICAL DATA:  52 year old female, code stroke. Left side paralysis, unknown time of onset. EXAM: CT ANGIOGRAPHY HEAD AND NECK CT PERFUSION BRAIN TECHNIQUE: Multidetector CT imaging of the head and neck was performed using the standard protocol during bolus administration of intravenous contrast. Multiplanar CT image reconstructions and MIPs were obtained to evaluate the vascular anatomy. Carotid stenosis measurements (when applicable) are obtained utilizing NASCET criteria, using the distal internal carotid diameter as the denominator. Multiphase CT imaging of the brain  was performed following IV bolus contrast injection. Subsequent parametric perfusion maps were calculated using RAPID software. CONTRAST:  90mL ISOVUE-370 IOPAMIDOL (ISOVUE-370) INJECTION 76% COMPARISON:  Head CT without  contrast 0935 hr today. FINDINGS: CT Brain Perfusion Findings: CBF (<30%) Volume: 7 mL, but underestimated due to hypodense areas of core infarct. Perfusion (Tmax>6.0s) volume: 0 milliliters Mismatch Volume: Not applicable Infarction Location:Right hemisphere. CTA NECK Skeleton: No acute osseous abnormality identified. Upper chest: Mild gas trapping in the upper lobes. No superior mediastinal lymphadenopathy. Other neck: Negative.  No cervical lymphadenopathy. Aortic arch: 3 vessel arch configuration. Mild motion artifact. No arch atherosclerosis or great vessel origin stenosis suspected. Right carotid system: Mild motion artifact at the right CCA origin without stenosis. No atherosclerosis identified in the cervical right carotid. Widely patent right ICA origin and bulb. Left carotid system: Mild motion artifact at the left CCA origin. Minimal if any atherosclerosis in the left cervical carotid. Widely patent left ICA origin and bulb. Vertebral arteries: No proximal right subclavian artery or right vertebral artery origin plaque or stenosis. Patent right vertebral artery to the skull base without stenosis. Mild motion artifact at the proximal subclavian artery. Normal left vertebral artery origin. Tortuous left V1 segment. Fairly codominant vertebral arteries in the neck. No plaque or stenosis in the left vertebral artery to the skull base. CTA HEAD Posterior circulation: Codominant distal vertebral arteries are patent to the vertebrobasilar junction without stenosis. Patent PICA origins. Patent basilar artery without stenosis. Normal SCA and PCA origins. Posterior communicating arteries are diminutive or absent. Bilateral PCA branches are symmetric and within normal limits. Anterior circulation: The left ICA siphon is patent with mild to moderate calcified plaque in the cavernous and supraclinoid ICA segments. Possible mild left supraclinoid segment stenosis. Patent left ICA terminus. Calcified plaque plus soft  plaque or thrombus in the right ICA siphon. Short segment (3 millimeter) occlusion of the distal cavernous right ICA (series 11, image 91) with reconstituted flow at the right anterior genu and patent right supraclinoid segment. Superimposed mild to moderate right siphon atherosclerotic stenosis. Patent right ICA terminus. Normal MCA origins. Questionable moderate stenosis of the right ACA origin. Bilateral ACA branches are within normal limits. Left MCA M1 segment, bifurcation, and left MCA branches are within normal limits. Right MCA, M1 segment bifurcation, and right MCA branches are within normal limits. No right MCA branch occlusion identified. Venous sinuses: Arterial dominant phase imaging. Grossly patent major dural venous sinuses. Anatomic variants: None. Review of the MIP images confirms the above findings IMPRESSION: 1. 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 Patent right ICA terminus and right MCA. 2. Right MCA core infarct volume underestimated by CT Perfusion, suggesting subacute ischemic timing. 3. Moderate bilateral ICA siphon atherosclerosis associated with #1. But no carotid atherosclerosis or stenosis in the neck. And no posterior circulation atherosclerosis or stenosis. 4. Study was reviewed in person with Dr. Milon DikesASHISH ARORA on 02/28/2017 beginning at 1000 hrs. Electronically Signed   By: Odessa FlemingH  Hall M.D.   On: 02/28/2017 10:19   Dg Chest Port 1 View  Result Date: 02/28/2017 CLINICAL DATA:  Headache, fever, shortness of breath. Mid chest pain. EXAM: PORTABLE CHEST 1 VIEW COMPARISON:  None FINDINGS: Cardiomegaly. Lungs are clear. No effusions. No acute bony abnormality. IMPRESSION: Cardiomegaly.  No active disease. Electronically Signed   By: Charlett NoseKevin  Dover M.D.   On: 02/28/2017 10:37   Ct Head Code  Stroke Wo Contrast  Result Date: 02/28/2017 CLINICAL DATA:  Code stroke. 52 year old  female with left side paralysis. Unknown time of onset. EXAM: CT HEAD WITHOUT CONTRAST TECHNIQUE: Contiguous axial images were obtained from the base of the skull through the vertex without intravenous contrast. COMPARISON:  None. FINDINGS: Brain: Multifocal confluent areas of gray and white matter hypodensity are scattered in the right hemisphere, with wedge-shaped peripheral areas of involvement in the right middle frontal gyrus, right parietal lobe, and superior right occipital lobe. Additional smaller areas of involvement including the superior right peri-Rolandic cortex (series 3, image 25). No acute intracranial hemorrhage identified. No associated intracranial mass effect. Gray-white matter differentiation in the right temporal lobe and right basal ganglia is preserved. Left hemisphere, thalamic, and posterior fossa gray-white matter differentiation appears normal. No ventriculomegaly.  Normal basilar cisterns. Vascular: Calcified atherosclerosis at the skull base. No suspicious intracranial vascular hyperdensity. Skull: Negative. Sinuses/Orbits: Clear. Other: Visualized orbits and scalp soft tissues are within normal limits. ASPECTS [facility] Stroke Program Early CT Score) - Ganglionic level infarction (caudate, lentiform nuclei, internal capsule, insula, M1-M3 cortex): 6 (abnormal M1 segment) - Supraganglionic infarction (M4-M6 cortex): 0 (M4 through and 6 abnormal). Total score (0-10 with 10 being normal): 6 IMPRESSION: 1. Multifocal subacute appearing cortical infarcts in the right hemisphere, primarily the right MCA territory although there is also superior right occipital lobe involvement. The right basal ganglia are spared. 2. No associated hemorrhage or intracranial mass effect. 3. ASPECTS is 6. 4. These results were communicated to Dr. Wilford Corner at 9:46 amon 1/23/2019by text page via the [facility] messaging system. Electronically Signed   By: Odessa Fleming M.D.   On: 02/28/2017 09:47    Marquette Saa, MD 02/28/2017, 12:02 PM PGY-3, [facility] Family Medicine FPTS Intern pager: [phone], text pages welcome

## 2017-02-28 NOTE — ED Notes (Signed)
Portable at bedside 

## 2017-02-28 NOTE — ED Notes (Signed)
CT called, taking pt to Room 1

## 2017-02-28 NOTE — ED Notes (Signed)
[facility] called to call code stroke per Dr request

## 2017-03-01 ENCOUNTER — Inpatient Hospital Stay (HOSPITAL_COMMUNITY): Payer: 59

## 2017-03-01 ENCOUNTER — Encounter (HOSPITAL_COMMUNITY): Payer: Self-pay | Admitting: Physical Medicine and Rehabilitation

## 2017-03-01 DIAGNOSIS — R269 Unspecified abnormalities of gait and mobility: Secondary | ICD-10-CM

## 2017-03-01 DIAGNOSIS — G8194 Hemiplegia, unspecified affecting left nondominant side: Secondary | ICD-10-CM

## 2017-03-01 DIAGNOSIS — R531 Weakness: Secondary | ICD-10-CM

## 2017-03-01 DIAGNOSIS — I69398 Other sequelae of cerebral infarction: Secondary | ICD-10-CM

## 2017-03-01 DIAGNOSIS — R414 Neurologic neglect syndrome: Secondary | ICD-10-CM

## 2017-03-01 DIAGNOSIS — H53462 Homonymous bilateral field defects, left side: Secondary | ICD-10-CM

## 2017-03-01 DIAGNOSIS — E139 Other specified diabetes mellitus without complications: Secondary | ICD-10-CM

## 2017-03-01 LAB — LIPID PANEL
Cholesterol: 236 mg/dL — ABNORMAL HIGH (ref 0–200)
HDL: 36 mg/dL — ABNORMAL LOW (ref >40)
LDL CALC: 173 mg/dL — AB (ref 0–99)
Total CHOL/HDL Ratio: 6.6 RATIO
Triglycerides: 135 mg/dL (ref <150)
VLDL: 27 mg/dL (ref 0–40)

## 2017-03-01 LAB — ECHOCARDIOGRAM COMPLETE
CHL CUP MV DEC (S): 155
E/e' ratio: 7.37
EWDT: 155 ms
FS: 30 % (ref 28–44)
IV/PV OW: 1.04
LA vol: 26.5 mL
LADIAMINDEX: 1.5 cm/m2
LASIZE: 27 mm
LAVOLA4C: 31.2 mL
LAVOLIN: 14.7 mL/m2
LEFT ATRIUM END SYS DIAM: 27 mm
LV PW d: 10.7 mm — AB (ref 0.6–1.1)
LV TDI E'MEDIAL: 7.51
LV e' LATERAL: 10 cm/s
LVEEAVG: 7.37
LVEEMED: 7.37
LVOT area: 2.54 cm2
LVOT diameter: 18 mm
Lateral S' vel: 9.36 cm/s
MV Peak grad: 2 mmHg
MV pk E vel: 73.7 m/s
MVPKAVEL: 75.2 m/s
TAPSE: 16.5 mm
TDI e' lateral: 10

## 2017-03-01 LAB — GLUCOSE, CAPILLARY
GLUCOSE-CAPILLARY: 131 mg/dL — AB (ref 65–99)
Glucose-Capillary: 167 mg/dL — ABNORMAL HIGH (ref 65–99)
Glucose-Capillary: 131 mg/dL — ABNORMAL HIGH (ref 65–99)

## 2017-03-01 LAB — BASIC METABOLIC PANEL
Anion gap: 11 (ref 5–15)
BUN: 20 mg/dL (ref 6–20)
CALCIUM: 9.5 mg/dL (ref 8.9–10.3)
CO2: 22 mmol/L (ref 22–32)
Chloride: 103 mmol/L (ref 101–111)
Creatinine, Ser: 1.07 mg/dL — ABNORMAL HIGH (ref 0.44–1.00)
GFR, EST NON AFRICAN AMERICAN: 59 mL/min — AB (ref >60)
GLUCOSE: 191 mg/dL — AB (ref 65–99)
POTASSIUM: 4 mmol/L (ref 3.5–5.1)
Sodium: 136 mmol/L (ref 135–145)

## 2017-03-01 LAB — HEMOGLOBIN A1C
Hgb A1c MFr Bld: 7.5 % — ABNORMAL HIGH (ref 4.8–5.6)
Mean Plasma Glucose: 168.55 mg/dL

## 2017-03-01 LAB — HIV ANTIBODY (ROUTINE TESTING W REFLEX): HIV SCREEN 4TH GENERATION: NONREACTIVE

## 2017-03-01 MED ORDER — HYDROCERIN EX CREA
TOPICAL_CREAM | Freq: Two times a day (BID) | CUTANEOUS | Status: DC | PRN
  Administered 2017-03-02 – 2017-03-04 (×2): via TOPICAL
  Filled 2017-03-01 (×2): qty 113

## 2017-03-01 MED ORDER — PERFLUTREN LIPID MICROSPHERE
1.0000 mL | INTRAVENOUS | Status: AC | PRN
  Administered 2017-03-01: 2 mL via INTRAVENOUS
  Filled 2017-03-01 (×2): qty 10

## 2017-03-01 MED ORDER — INSULIN ASPART 100 UNIT/ML ~~LOC~~ SOLN
0.0000 [IU] | Freq: Three times a day (TID) | SUBCUTANEOUS | Status: DC
  Administered 2017-03-01: 1 [IU] via SUBCUTANEOUS
  Administered 2017-03-01: 2 [IU] via SUBCUTANEOUS
  Administered 2017-03-02: 1 [IU] via SUBCUTANEOUS
  Administered 2017-03-02 – 2017-03-03 (×2): 2 [IU] via SUBCUTANEOUS
  Administered 2017-03-03 (×2): 1 [IU] via SUBCUTANEOUS
  Administered 2017-03-04: 3 [IU] via SUBCUTANEOUS
  Administered 2017-03-04: 2 [IU] via SUBCUTANEOUS
  Administered 2017-03-04 – 2017-03-05 (×3): 1 [IU] via SUBCUTANEOUS

## 2017-03-01 NOTE — Care Management Note (Signed)
Case Management Note  Patient Details  Name: Kristen Short MRN: [id number] Date of Birth: March 26, 1965  Subjective/Objective:    Pt admitted with CVA. She is from home [name] with her spouse. She does not speak [place].              Action/Plan: PT/OT recommending CIR. CM following for d/c disposition.  Expected Discharge Date:                  Expected Discharge Plan:  IP Rehab Facility  In-House Referral:     Discharge planning Services  CM Consult  Post Acute Care Choice:    Choice offered to:     DME Arranged:    DME Agency:     HH Arranged:    HH Agency:     Status of Service:  In process, will continue to follow  If discussed at Long Length of Stay Meetings, dates discussed:    Additional Comments:  Kermit BaloKelli F Dadrian Ballantine, RN 03/01/2017, 3:21 PM

## 2017-03-01 NOTE — Evaluation (Signed)
Physical Therapy Evaluation Patient Details Name: Kristen Short MRN: [id number] DOB: 1965-05-31 Today's Date: 03/01/2017   History of Present Illness  52yo female presenting with L sided weakness, no significant PMH. Imaging studies show acute CVA involving R MCA and PCA. No TPA was given. Significant L UE weakness (0/5) and L LE weakness (3/5), L facial droop, possible L HH noted. She is from [place], speaks [place] but no english.   Clinical Impression   Patient received in bed with her husband present; they do not speak much English, so used patient's sister to translate via telephone as well as formal interpreter to clarify information throughout session. Patient is able to complete bed mobility and functional transfers with the assist levels as detailed below, however demonstrates poor safety awareness and is impulsive, also demonstrates L inattention as well as reduced awareness of functional impairments. She reports numbness in her L LE as well as what was interpreted to be nerve pain in her L UE; note L LE general strength approximately 3-/5 today. She was left up in the chair with chair alarm set, MD and speech therapist present and attending. Due to identified functional limitations, she is not safe to return home and will strongly benefit from CIR to address deficits and optimize overall level of function/reduce fall risk moving forward.     Follow Up Recommendations CIR    Equipment Recommendations  None recommended by PT(defer to next venue )    Recommendations for Other Services       Precautions / Restrictions Precautions Precautions: Fall;Other (comment) Precaution Comments: severe L UE weakness/moderate L LE weakness, L inattention, impulsive  Restrictions Weight Bearing Restrictions: No      Mobility  Bed Mobility Overal bed mobility: Needs Assistance Bed Mobility: Supine to Sit     Supine to sit: Min assist;+2 for safety/equipment     General bed mobility  comments: to bring LEs around, bring trunk up at edge of bed   Transfers Overall transfer level: Needs assistance Equipment used: None Transfers: Sit to/from [employer] Sit to Stand: Mod assist;+2 safety/equipment Stand pivot transfers: Mod assist;+2 safety/equipment       General transfer comment: +2 assist for safety today, patient impulsive and requiring cues for safety and sequencing   Ambulation/Gait             General Gait Details: DNT today   Stairs            Wheelchair Mobility    Modified Rankin (Stroke Patients Only)       Balance Overall balance assessment: Needs assistance Sitting-balance support: Feet supported Sitting balance-Leahy Scale: Fair Sitting balance - Comments: general mild unsteadiness requiring constant min guard to MinA     Standing balance-Leahy Scale: Poor Standing balance comment: reliant on assistance to maintain balance during transfer                              Pertinent Vitals/Pain Pain Assessment: Faces Faces Pain Scale: Hurts little more Pain Location: L UE. L LE  Pain Descriptors / Indicators: Shooting;Tingling Pain Intervention(s): Limited activity within patient's tolerance;Monitored during session;Repositioned    Home Living Family/patient expects to be discharged to:: Private residence Living Arrangements: Spouse/significant other;Children Available Help at Discharge: Family Type of Home: Apartment       Home Layout: One level   Additional Comments: unsure, patient independent at baseline however     Prior Function Level of Independence: Independent  Hand Dominance        Extremity/Trunk Assessment   Upper Extremity Assessment Upper Extremity Assessment: Defer to OT evaluation    Lower Extremity Assessment Lower Extremity Assessment: LLE deficits/detail LLE Deficits / Details: L LE strength approximately 3-/5  LLE Sensation: decreased  proprioception    Cervical / Trunk Assessment Cervical / Trunk Assessment: Normal  Communication   Communication: Prefers language other than [place];Interpreter utilized  Cognition Arousal/Alertness: Awake/alert Behavior During Therapy: Impulsive Overall Cognitive Status: Impaired/Different from baseline Area of Impairment: Safety/judgement;Problem solving;Attention                   Current Attention Level: Sustained     Safety/Judgement: Decreased awareness of safety;Decreased awareness of deficits   Problem Solving: Requires verbal cues;Requires tactile cues;Difficulty sequencing General Comments: impulsive, does not speak English also making it difficult to assess cognition       General Comments General comments (skin integrity, edema, etc.): impulsive, L inattention due to possible HH, numbness L LE/nerve pain L UE, reduced awareness of impairments, poor safety awareness     Exercises     Assessment/Plan    PT Assessment Patient needs continued PT services  PT Problem List Decreased strength;Decreased mobility;Decreased safety awareness;Decreased coordination;Decreased knowledge of precautions;Decreased activity tolerance;Decreased balance;Pain       PT Treatment Interventions DME instruction;Therapeutic activities;Gait training;Therapeutic exercise;Patient/family education;Stair training;Balance training;Functional mobility training;Neuromuscular re-education    PT Goals (Current goals can be found in the Care Plan section)  Acute Rehab PT Goals Patient Stated Goal: to get better/return to PLOF  PT Goal Formulation: With patient Time For Goal Achievement: 03/15/17 Potential to Achieve Goals: Fair    Frequency Min 5X/week   Barriers to discharge Decreased caregiver support      Co-evaluation               AM-PAC PT "6 Clicks" Daily Activity  Outcome Measure Difficulty turning over in bed (including adjusting bedclothes, sheets and blankets)?:  Unable Difficulty moving from lying on back to sitting on the side of the bed? : Unable Difficulty sitting down on and standing up from a chair with arms (e.g., wheelchair, bedside commode, etc,.)?: Unable Help needed moving to and from a bed to chair (including a wheelchair)?: A Lot Help needed walking in hospital room?: Total Help needed climbing 3-5 steps with a railing? : Total 6 Click Score: 7    End of Session Equipment Utilized During Treatment: Gait belt Activity Tolerance: Patient tolerated treatment well Patient left: in chair;with call bell/phone within reach;with chair alarm set;Other (comment);with family/visitor present(MD, speech therapist present and attending) Nurse Communication: Mobility status;Need for lift equipment PT Visit Diagnosis: Unsteadiness on feet (R26.81);Muscle weakness (generalized) (M62.81);Other abnormalities of gait and mobility (R26.89);Hemiplegia and hemiparesis;Difficulty in walking, not elsewhere classified (R26.2) Hemiplegia - Right/Left: Left Hemiplegia - dominant/non-dominant: Non-dominant Hemiplegia - caused by: Cerebral infarction    Time: 0935-1020 PT Time Calculation (min) (ACUTE ONLY): 45 min   Charges:   PT Evaluation $PT Eval Moderate Complexity: 1 Mod PT Treatments $Therapeutic Activity: 8-22 mins   PT G Codes:        Nedra HaiKristen Makeisha Jentsch PT, DPT, CBIS  Supplemental Physical Therapist [facility]   Pager [phone]

## 2017-03-01 NOTE — Progress Notes (Signed)
NEUROHOSPITALISTS STROKE TEAM - DAILY PROGRESS NOTE   ADMISSION HISTORY: Kristen Short is a 52 y.o. female with no known PMH, recent immigrant from [place] who lives in [place] and the rest of the family lives in [place], was brought in for evaluation of left-sided weakness.  She had been complaining of a headache that started around Saturday.  She describes it as a very severe pressure-like headache all over her head. Over the next day or so, she started having some difficulty walking.  Yesterday it was noted that around 3 PM she could not walk and had severe weakness in her leg. For the symptoms, she was driven by family to [facility] and brought to [facility] for further evaluation.  Upon arrival here in the ER, she had left facial weakness, left arm facility, left leg weakness and right gaze preference.  A code stroke was called because initial information was that her last known well was 3 PM yesterday whereas on more detailed history taking, it seems like the symptoms have been ongoing at least since Sunday. No recent illnesses.  No fever chills.  No chest pain palpitations.  No shortness of breath.  No nausea vomiting abdominal pain diarrhea constipation.  LKW: 1500 hrs, 02/28/2017 is what was told at first but seems like it was sometime on Sunday, 02/25/2017 tpa given?: no, OSW Premorbid modified Rankin scale (mRS):0 NIHSS - 15  SUBJECTIVE (INTERVAL HISTORY)  is at the bedside. Patient is found laying in bed in NAD. Overall she feels her condition is stable. She has a mild headache on the right side of the head. . No new events reported overnight.   OBJECTIVE Lab Results: CBC:  Recent Labs  Lab 02/28/17 0919 02/28/17 0941  WBC 9.5  --   HGB 16.1* 17.7*  HCT 49.9* 52.0*  MCV 77.4*  --   PLT 221  --    BMP: Recent Labs  Lab 02/28/17 0919 02/28/17 0941  NA 139 139  K 4.4 5.2*  CL 102 104  CO2 24  --   GLUCOSE  193* 194*  BUN 10 15  CREATININE 1.02* 0.90  CALCIUM 9.6  --    Liver Function Tests:  Recent Labs  Lab 02/28/17 0919  AST 21  ALT 16  ALKPHOS 76  BILITOT 0.6  PROT 8.0  ALBUMIN 3.8   Coagulation Studies:  Recent Labs    02/28/17 0919  APTT 33  INR 0.96   Urinalysis:  Recent Labs  Lab 02/28/17 1150  COLORURINE YELLOW  APPEARANCEUR CLEAR  LABSPEC >1.046*  PHURINE 7.0  GLUCOSEU NEGATIVE  HGBUR MODERATE*  BILIRUBINUR NEGATIVE  KETONESUR 5*  PROTEINUR 100*  NITRITE NEGATIVE  LEUKOCYTESUR NEGATIVE   Urine Drug Screen:     Component Value Date/Time   LABOPIA NONE DETECTED 02/28/2017 1150   COCAINSCRNUR NONE DETECTED 02/28/2017 1150   LABBENZ NONE DETECTED 02/28/2017 1150   AMPHETMU NONE DETECTED 02/28/2017 1150   THCU NONE DETECTED 02/28/2017 1150   LABBARB NONE DETECTED 02/28/2017 1150    Alcohol Level:  Recent Labs  Lab 02/28/17 0919  ETH <10   PHYSICAL EXAM Temp:  [97.7 F (36.5 C)-99 F (37.2 C)] 97.7 F (36.5 C) (01/24 0604) Pulse Rate:  [77-99] 83 (01/24 0604) Resp:  [15-20] 16 (01/24 0604) BP: (103-151)/(68-94) 136/80 (01/24 0604) SpO2:  [93 %-100 %] 99 % (01/24 0604) General - Well nourished, well developed, in no apparent distress HEENT-  Normocephalic,  Cardiovascular - Regular rate and rhythm  Respiratory -  Lungs clear bilaterally. No wheezing. Abdomen - soft and non-tender, BS normal Extremities- no edema or cyanosis NEURO:  Mental Status: AA&Ox3  Language: speech is dysarthric.  Naming, repetition, fluency, and comprehension intact. Cranial nerves: Pupils equal round reactive to light, gaze preference to the right, able to get to midline but unable to cross midline to the left, left homonymous hemianopsia, left lower facial weakness at rest and on smiling, decreased sensation on the left face, tongue midline. Motor exam: 0/5 left upper extremity, 3/5 left lower extremity with vertical drift, 5/5 right upper and lower  extremities. Sensory: Diminished sensation on the left hemibody to all modalities.  Extinction on double simultaneous stimulation. Cerebellar function: Intact finger-nose-finger on the right, intact heel-knee-shin on the right, unable to perform on the left. Gait testing was deferred.  IMAGING: I have personally reviewed the radiological images below and agree with the radiology interpretations.  Ct Angio Head /Neck and Perfusion W Or Wo Contrast Result Date: 02/28/2017 IMPRESSION: 1. 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 Patent right ICA terminus and right MCA. 2. Right MCA core infarct volume underestimated by CT Perfusion, suggesting subacute ischemic timing. 3. Moderate bilateral ICA siphon atherosclerosis associated with #1. But no carotid atherosclerosis or stenosis in the neck. And no posterior circulation atherosclerosis or stenosis. 4. Study was reviewed in person with Dr. Milon Dikes on 02/28/2017 beginning at 1000 hrs. Electronically Signed   By: Odessa Fleming M.D.   On: 02/28/2017 10:19   Mr Brain Wo Contrast Result Date: 02/28/2017  IMPRESSION: 1. Limited 2 sequence diffusion-weighted imaging examination. 2. Acute large RIGHT frontal/MCA and RIGHT posterior watershed territory infarcts. 3. Subacute small RIGHT frontal lobe/MCA infarct. Electronically Signed   By: Awilda Metro M.D.   On: 02/28/2017 22:28   Dg Chest Port 1 View Result Date: 02/28/2017  IMPRESSION: Cardiomegaly.  No active disease. Electronically Signed   By: Charlett Nose M.D.   On: 02/28/2017 10:37   Ct Head Code Stroke Wo Contrast Result Date: 02/28/2017 IMPRESSION: 1. Multifocal subacute appearing cortical infarcts in the right hemisphere, primarily the right MCA territory although there is also superior right occipital lobe involvement. The right basal ganglia are spared. 2. No associated hemorrhage or  intracranial mass effect. 3. ASPECTS is 6. 4. These results were communicated to Dr. Wilford Corner at 9:46 amon 1/23/2019by text page via the [facility] messaging system. Electronically Signed   By: Odessa Fleming M.D.   On: 02/28/2017 09:47   Echocardiogram:                                              PENDING TEE /Loop Recorder:                                        PENDING     IMPRESSION: Kristen Short is a 52 y.o. female with no known past medical history brought in for evaluation of left-sided weakness that started Saturday or Sunday. Symptoms consistent with a right MCA territory stroke. NIH 15 on arrival.  Not a candidate for TPA as she is out of the window.  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  Acute large RIGHT frontal/MCA and RIGHT posterior watershed territory infarcts Subacute small RIGHT frontal lobe/MCA infarct RIGHT ICA occlusion  Suspected Etiology: atheroembolic vs cardioembolic source  Resultant Symptoms: Left sided deficits Stroke Risk Factors: diabetes mellitus and hyperlipidemia Other Stroke Risk Factors: None known  Outstanding Stroke Work-up Studies:     Echocardiogram:                                                    PENDING TEE /Loop Recorder:                                             PENDING  PLAN  03/01/2017: Continue Aspirin/ Statin Frequent neuro checks Telemetry monitoring PT/OT/SLP Consult PM & Rehab Consult Case Management /MSW -  Needs PCP TEE and Loop Recorder Placement- Cardiology aware, attempting to schedule for Friday Ongoing aggressive stroke risk factor management Patient's family will be counseled to be compliant with her antithrombotic medications Patient;s family will be counseled on Lifestyle modifications incl, Diet, Exercise, and Stress Follow up with [facility] in 6 weeks  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 Long term BP goal normotensive. May slowly start B/P medications after 48 hours, if necessary Home Meds: NONE  HYPERLIPIDEMIA: New Diagnosis    Component Value Date/Time   CHOL 236 (H) 03/01/2017 0327   TRIG 135 03/01/2017 0327   HDL 36 (L) 03/01/2017 0327   CHOLHDL 6.6 03/01/2017 0327   VLDL 27 03/01/2017 0327   LDLCALC 173 (H) 03/01/2017 0327  Home Meds:  NONE LDL  goal < 70 Started on  Lipitor to 80 mg daily Continue statin at discharge  DIABETES: New Diagnosis Lab Results  Component Value Date   HGBA1C 7.5 (H) 03/01/2017  HgbA1c goal < 7.0 Currently on: No meds, will need Novolog Continue CBG monitoring and SSI to maintain glucose 140-180 mg/dl DM education   Other Active Problems: Active Problems:   CVA (cerebral vascular accident[facility])    Hospital day # 1 VTE prophylaxis: Lovenox  Diet : Fall precautions Diet NPO time specified   FAMILY UPDATES:  family at bedside  TEAM UPDATES: Nestor Ramp, MD   Prior Home Stroke Medications:  No antithrombotic  Discharge Stroke Meds:  Please discharge patient on aspirin 325 mg daily for now  Disposition: Final discharge disposition not confirmed Therapy Recs:               PENDING Follow Up:  Patient, No Pcp Per -PCP Follow up in 1-2 weeks   Case Management aware of need    Attending Note:  03/01/2017 ASSESSMENT:    Personally examined patient and images, and have participated in and made any corrections needed to history, physical, neuro exam,assessment  and plan as  stated above.  I have personally obtained the history, evaluated lab date, reviewed imaging studies and agree with radiology interpretations.    Naomie DeanAntonia Azhia Siefken, MD Stroke Neurology   Stroke Neurology Team 03/01/2017 8:47 AM  To contact Stroke Continuity provider, please refer to [email or web address]. After hours, contact General Neurology

## 2017-03-01 NOTE — Progress Notes (Signed)
  Echocardiogram 2D Echocardiogram has been performed.  Blaike Newburn G Deniece Rankin 03/01/2017, 12:30 PM

## 2017-03-01 NOTE — Progress Notes (Signed)
Rehab Admissions Coordinator Note:  Patient was screened by Trish MageLogue, Jearld Hemp M for appropriateness for an Inpatient Acute Rehab Consult.  At this time, we are recommending Inpatient Rehab consult.  Lelon FrohlichLogue, Carita Sollars M 03/01/2017, 1:23 PM  I can be reached at [phone].

## 2017-03-01 NOTE — Evaluation (Signed)
Clinical/Bedside Swallow Evaluation Patient Details  Name: Kristen Short MRN: [id number] Date of Birth: [birth year]  Today's Date: 03/01/2017 Time: SLP Start Time (ACUTE ONLY): 1010 SLP Stop Time (ACUTE ONLY): 1053 SLP Time Calculation (min) (ACUTE ONLY): 43 min  Past Medical History: History reviewed. No pertinent past medical history. Past Surgical History: History reviewed. No pertinent surgical history. HPI:  Kristen Short a 52 y.o.femalepresenting with L-sided weakness.No significant PMH. MRI with acute large R frontal/MCA and R posterior watershed territory infarctsand subacute small RIGHT frontal lobe/MCA infarct.   Assessment / Plan / Recommendation Clinical Impression  Patient presents with a mild-moderate oral dysphagia characterized by impaired CN VII and XII with resultant left sided lingual and labial weakness. Intermittent anterior labial spillage and left sided pocketing noted but with seemingly good airway protection. Will initiate diet with close f/u for use of aspiration precautions and compensatory strategies to minimize risk.  SLP Visit Diagnosis: Dysphagia, unspecified (R13.10)    Aspiration Risk  Moderate aspiration risk    Diet Recommendation Dysphagia 3 (Mech soft);Thin liquid   Liquid Administration via: Cup;Straw Medication Administration: Whole meds with puree Supervision: Patient able to self feed;Full supervision/cueing for compensatory strategies Compensations: Slow rate;Small sips/bites;Lingual sweep for clearance of pocketing Postural Changes: Seated upright at 90 degrees    Other  Recommendations Oral Care Recommendations: Oral care BID   Follow up Recommendations Inpatient Rehab      Frequency and Duration min 2x/week  2 weeks       Prognosis Prognosis for Safe Diet Advancement: Good Barriers to Reach Goals: Cognitive deficits      Swallow Study   General HPI: Kristen Short a 52 y.o.femalepresenting with L-sided weakness.No significant  PMH. MRI with acute large R frontal/MCA and R posterior watershed territory infarctsand subacute small RIGHT frontal lobe/MCA infarct. Type of Study: Bedside Swallow Evaluation Previous Swallow Assessment: none Diet Prior to this Study: NPO Temperature Spikes Noted: No Respiratory Status: Room air History of Recent Intubation: No Behavior/Cognition: Alert;Cooperative;Pleasant mood Oral Cavity Assessment: Within Functional Limits Oral Care Completed by SLP: Yes Oral Cavity - Dentition: Adequate natural dentition Vision: Functional for self-feeding Self-Feeding Abilities: Able to feed self Patient Positioning: Upright in chair Baseline Vocal Quality: Normal Volitional Cough: Strong Volitional Swallow: Able to elicit    Oral/Motor/Sensory Function Overall Oral Motor/Sensory Function: Moderate impairment Facial ROM: Reduced left;Suspected CN VII (facial) dysfunction Facial Symmetry: Abnormal symmetry left;Suspected CN VII (facial) dysfunction Facial Strength: Reduced left;Suspected CN VII (facial) dysfunction Facial Sensation: Within Functional Limits Lingual ROM: Reduced left;Suspected CN XII (hypoglossal) dysfunction Lingual Symmetry: Within Functional Limits Lingual Strength: Suspected CN XII (hypoglossal) dysfunction;Reduced Lingual Sensation: Within Functional Limits Velum: Within Functional Limits Mandible: Within Functional Limits   Ice Chips Ice chips: Within functional limits   Thin Liquid Thin Liquid: Impaired Presentation: Cup;Self Fed;Straw Oral Phase Impairments: Reduced labial seal Oral Phase Functional Implications: Left anterior spillage    Nectar Thick Nectar Thick Liquid: Not tested   Honey Thick Honey Thick Liquid: Not tested   Puree Puree: Within functional limits Presentation: Spoon;Self Fed   Solid   GO   Solid: Impaired Presentation: Self Fed Oral Phase Impairments: Impaired mastication Oral Phase Functional Implications: Prolonged oral transit;Oral  residue       UnitedHealthLeah Tekisha Darcey MA, CCC-SLP [phone]  Kristen Short Kristen Short 03/01/2017,11:23 AM

## 2017-03-01 NOTE — Progress Notes (Signed)
Family Medicine Teaching Service Daily Progress Note Intern Pager: [phone]  Patient name: Kristen Short Medical record number: [id number] Date of birth: 1965/03/26 Age: 52 y.o. Gender: female  Primary Care Provider: Patient, No Pcp Per Consultants: Neuro Code Status: Full  Pt Overview and Major Events to Date:  1/23 - admitted for stroke  Assessment and Plan: Kristen Short is a 52 y.o. female presenting with L-sided weakness. No significant PMH.   L-sided weakness with R MCA stroke: CTA head/neck with acute ischemic stroke involving R MCA and PCA territory.  Did not receive tPA as out of treatment window on presentation. Significant LUE weakness (0/5 strength), with accompanying LLE weakness (3/5) and L-sided facial droop. 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 EKG sinus rhythm with borderline prolonged PR but no arrhythmias noted. UDS negative. Risk start labs: LDL 173, A1c 7.5. MRI with a cute large R frontal/MCA and R posterior watershed territory infarcts and subacute small RIGHT frontal lobe/MCA infarct. Normotensive Bps. - Neuro following, appreciate recs - Echo - Telemetry - Neuro checks q2 - NPO until SLP swallow screen (failed bedside swallow) - Continue aspirin 325mg , atorvastatin 80mg  daily - PT/OT/speech consult - Permissive HTN x24-48hrs (SBP <220) - Carotid dopplers not necessary as CTA neck performed - Will need to establish with PCP likely in [place] area upon discharge  New-Onset Type 2 Diabetes A1c on admission 7.5, no previous PMH or medical care so unclear how long CBGs elevated. CBGs this am 168. - sSSI AC & qHS  FEN/GI: NPO until swallow screen, then heart healthy diet Prophylaxis: Lovenox  Disposition: continue inpatient management of stroke  Subjective:  Patient hungry, wants to eat. Denies pain.  Objective: Temp:  [97.7 F (36.5 C)-99 F (37.2 C)] 97.7 F (36.5 C) (01/24 0604) Pulse Rate:  [77-99] 83 (01/24  0604) Resp:  [15-20] 16 (01/24 0604) BP: (103-151)/(68-94) 136/80 (01/24 0604) SpO2:  [93 %-100 %] 99 % (01/24 0604) Physical Exam: General: pleasant female sitting in bed with assistance from husband, in NAD Cardiovascular: RRR, no murmur/rub/gallops Respiratory: CTAB, no wheezes/rales/rhonchi Abdomen: soft, NTND, +BS Extremities: warm and well perfused. MSK: Strength 0/5 LUE including grip, 2/5 LLE, 5/5 RUE including grip, 3-4/5 RLE. No edema.  Neuro: Alert and oriented, nonslurred speech. Difficult to assess optic field. PERRL. EOMI did not track L sided visual field but stated able to see fingers. Sensation decreased to L sided face and extremities. Hearing grossly intact bilaterally.  Tongue protrudes normally with no deviation.  Shoulder shrug decreased on the L. L sided facial droop.  Laboratory: Recent Labs  Lab 02/28/17 0919 02/28/17 0941  WBC 9.5  --   HGB 16.1* 17.7*  HCT 49.9* 52.0*  PLT 221  --    Recent Labs  Lab 02/28/17 0919 02/28/17 0941  NA 139 139  K 4.4 5.2*  CL 102 104  CO2 24  --   BUN 10 15  CREATININE 1.02* 0.90  CALCIUM 9.6  --   PROT 8.0  --   BILITOT 0.6  --   ALKPHOS 76  --   ALT 16  --   AST 21  --   GLUCOSE 193* 194*   Imaging/Diagnostic Tests: Ct Angio Head W Or Wo Contrast  Result Date: 02/28/2017 CLINICAL DATA:  52 year old female, code stroke. Left side paralysis, unknown time of onset. EXAM: CT ANGIOGRAPHY HEAD AND NECK CT PERFUSION BRAIN TECHNIQUE: Multidetector CT imaging of the head and neck was performed using the standard protocol during bolus  administration of intravenous contrast. Multiplanar CT image reconstructions and MIPs were obtained to evaluate the vascular anatomy. Carotid stenosis measurements (when applicable) are obtained utilizing NASCET criteria, using the distal internal carotid diameter as the denominator. Multiphase CT imaging of the brain was performed following IV bolus contrast injection. Subsequent parametric  perfusion maps were calculated using RAPID software. CONTRAST:  90mL ISOVUE-370 IOPAMIDOL (ISOVUE-370) INJECTION 76% COMPARISON:  Head CT without contrast 0935 hr today. FINDINGS: CT Brain Perfusion Findings: CBF (<30%) Volume: 7 mL, but underestimated due to hypodense areas of core infarct. Perfusion (Tmax>6.0s) volume: 0 milliliters Mismatch Volume: Not applicable Infarction Location:Right hemisphere. CTA NECK Skeleton: No acute osseous abnormality identified. Upper chest: Mild gas trapping in the upper lobes. No superior mediastinal lymphadenopathy. Other neck: Negative.  No cervical lymphadenopathy. Aortic arch: 3 vessel arch configuration. Mild motion artifact. No arch atherosclerosis or great vessel origin stenosis suspected. Right carotid system: Mild motion artifact at the right CCA origin without stenosis. No atherosclerosis identified in the cervical right carotid. Widely patent right ICA origin and bulb. Left carotid system: Mild motion artifact at the left CCA origin. Minimal if any atherosclerosis in the left cervical carotid. Widely patent left ICA origin and bulb. Vertebral arteries: No proximal right subclavian artery or right vertebral artery origin plaque or stenosis. Patent right vertebral artery to the skull base without stenosis. Mild motion artifact at the proximal subclavian artery. Normal left vertebral artery origin. Tortuous left V1 segment. Fairly codominant vertebral arteries in the neck. No plaque or stenosis in the left vertebral artery to the skull base. CTA HEAD Posterior circulation: Codominant distal vertebral arteries are patent to the vertebrobasilar junction without stenosis. Patent PICA origins. Patent basilar artery without stenosis. Normal SCA and PCA origins. Posterior communicating arteries are diminutive or absent. Bilateral PCA branches are symmetric and within normal limits. Anterior circulation: The left ICA siphon is patent with mild to moderate calcified plaque in the  cavernous and supraclinoid ICA segments. Possible mild left supraclinoid segment stenosis. Patent left ICA terminus. Calcified plaque plus soft plaque or thrombus in the right ICA siphon. Short segment (3 millimeter) occlusion of the distal cavernous right ICA (series 11, image 91) with reconstituted flow at the right anterior genu and patent right supraclinoid segment. Superimposed mild to moderate right siphon atherosclerotic stenosis. Patent right ICA terminus. Normal MCA origins. Questionable moderate stenosis of the right ACA origin. Bilateral ACA branches are within normal limits. Left MCA M1 segment, bifurcation, and left MCA branches are within normal limits. Right MCA, M1 segment bifurcation, and right MCA branches are within normal limits. No right MCA branch occlusion identified. Venous sinuses: Arterial dominant phase imaging. Grossly patent major dural venous sinuses. Anatomic variants: None. Review of the MIP images confirms the above findings IMPRESSION: 1. 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 Patent right ICA terminus and right MCA. 2. Right MCA core infarct volume underestimated by CT Perfusion, suggesting subacute ischemic timing. 3. Moderate bilateral ICA siphon atherosclerosis associated with #1. But no carotid atherosclerosis or stenosis in the neck. And no posterior circulation atherosclerosis or stenosis. 4. Study was reviewed in person with Dr. Milon Dikes on 02/28/2017 beginning at 1000 hrs. Electronically Signed   By: Odessa Fleming M.D.   On: 02/28/2017 10:19   Ct Angio Neck W Or Wo Contrast  Result Date: 02/28/2017 CLINICAL DATA:  52 year old female, code stroke. Left side paralysis, unknown  time of onset. EXAM: CT ANGIOGRAPHY HEAD AND NECK CT PERFUSION BRAIN TECHNIQUE: Multidetector CT imaging of the head and neck was performed using the standard protocol during bolus  administration of intravenous contrast. Multiplanar CT image reconstructions and MIPs were obtained to evaluate the vascular anatomy. Carotid stenosis measurements (when applicable) are obtained utilizing NASCET criteria, using the distal internal carotid diameter as the denominator. Multiphase CT imaging of the brain was performed following IV bolus contrast injection. Subsequent parametric perfusion maps were calculated using RAPID software. CONTRAST:  90mL ISOVUE-370 IOPAMIDOL (ISOVUE-370) INJECTION 76% COMPARISON:  Head CT without contrast 0935 hr today. FINDINGS: CT Brain Perfusion Findings: CBF (<30%) Volume: 7 mL, but underestimated due to hypodense areas of core infarct. Perfusion (Tmax>6.0s) volume: 0 milliliters Mismatch Volume: Not applicable Infarction Location:Right hemisphere. CTA NECK Skeleton: No acute osseous abnormality identified. Upper chest: Mild gas trapping in the upper lobes. No superior mediastinal lymphadenopathy. Other neck: Negative.  No cervical lymphadenopathy. Aortic arch: 3 vessel arch configuration. Mild motion artifact. No arch atherosclerosis or great vessel origin stenosis suspected. Right carotid system: Mild motion artifact at the right CCA origin without stenosis. No atherosclerosis identified in the cervical right carotid. Widely patent right ICA origin and bulb. Left carotid system: Mild motion artifact at the left CCA origin. Minimal if any atherosclerosis in the left cervical carotid. Widely patent left ICA origin and bulb. Vertebral arteries: No proximal right subclavian artery or right vertebral artery origin plaque or stenosis. Patent right vertebral artery to the skull base without stenosis. Mild motion artifact at the proximal subclavian artery. Normal left vertebral artery origin. Tortuous left V1 segment. Fairly codominant vertebral arteries in the neck. No plaque or stenosis in the left vertebral artery to the skull base. CTA HEAD Posterior circulation: Codominant  distal vertebral arteries are patent to the vertebrobasilar junction without stenosis. Patent PICA origins. Patent basilar artery without stenosis. Normal SCA and PCA origins. Posterior communicating arteries are diminutive or absent. Bilateral PCA branches are symmetric and within normal limits. Anterior circulation: The left ICA siphon is patent with mild to moderate calcified plaque in the cavernous and supraclinoid ICA segments. Possible mild left supraclinoid segment stenosis. Patent left ICA terminus. Calcified plaque plus soft plaque or thrombus in the right ICA siphon. Short segment (3 millimeter) occlusion of the distal cavernous right ICA (series 11, image 91) with reconstituted flow at the right anterior genu and patent right supraclinoid segment. Superimposed mild to moderate right siphon atherosclerotic stenosis. Patent right ICA terminus. Normal MCA origins. Questionable moderate stenosis of the right ACA origin. Bilateral ACA branches are within normal limits. Left MCA M1 segment, bifurcation, and left MCA branches are within normal limits. Right MCA, M1 segment bifurcation, and right MCA branches are within normal limits. No right MCA branch occlusion identified. Venous sinuses: Arterial dominant phase imaging. Grossly patent major dural venous sinuses. Anatomic variants: None. Review of the MIP images confirms the above findings IMPRESSION: 1. 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 Patent right ICA terminus and right MCA. 2. Right MCA core infarct volume underestimated by CT Perfusion, suggesting subacute ischemic timing. 3. Moderate bilateral ICA siphon atherosclerosis associated with #1. But no carotid atherosclerosis or stenosis in the neck. And no posterior circulation atherosclerosis or stenosis. 4. Study was reviewed in person with Dr. Milon Dikes on 02/28/2017 beginning at 1000  hrs. Electronically Signed   By: Odessa Fleming  M.D.   On: 02/28/2017 10:19   Mr Brain Wo Contrast  Result Date: 02/28/2017 CLINICAL DATA:  Altered mental status, TIA. LEFT-sided weakness, follow-up acute RIGHT MCA and PCA territory infarcts. EXAM: MRI HEAD WITHOUT CONTRAST TECHNIQUE: Axial and coronal diffusion weighted imaging. Examination was terminated, per technologist note, patient began kicking and screaming. COMPARISON:  CT/CTA HEAD and neck February 28, 2017 FINDINGS: Confluent RIGHT frontal lobe reduced diffusion extending to the insula with low ADC values. Patchy discontinuous RIGHT frontal lobe reduced diffusion with normalized ADC values. Confluent RIGHT parietooccipital and subcentimeter RIGHT posterior temporal lobe foci of reduced diffusion with low ADC values. Regional mass effect without midline shift. No hydrocephalus. No abnormal extra-axial fluid collections. IMPRESSION: 1. Limited 2 sequence diffusion-weighted imaging examination. 2. Acute large RIGHT frontal/MCA and RIGHT posterior watershed territory infarcts. 3. Subacute small RIGHT frontal lobe/MCA infarct. Electronically Signed   By: Awilda Metro M.D.   On: 02/28/2017 22:28   Ct Cerebral Perfusion W Contrast  Result Date: 02/28/2017 CLINICAL DATA:  52 year old female, code stroke. Left side paralysis, unknown time of onset. EXAM: CT ANGIOGRAPHY HEAD AND NECK CT PERFUSION BRAIN TECHNIQUE: Multidetector CT imaging of the head and neck was performed using the standard protocol during bolus administration of intravenous contrast. Multiplanar CT image reconstructions and MIPs were obtained to evaluate the vascular anatomy. Carotid stenosis measurements (when applicable) are obtained utilizing NASCET criteria, using the distal internal carotid diameter as the denominator. Multiphase CT imaging of the brain was performed following IV bolus contrast injection. Subsequent parametric perfusion maps were calculated using RAPID software.  CONTRAST:  90mL ISOVUE-370 IOPAMIDOL (ISOVUE-370) INJECTION 76% COMPARISON:  Head CT without contrast 0935 hr today. FINDINGS: CT Brain Perfusion Findings: CBF (<30%) Volume: 7 mL, but underestimated due to hypodense areas of core infarct. Perfusion (Tmax>6.0s) volume: 0 milliliters Mismatch Volume: Not applicable Infarction Location:Right hemisphere. CTA NECK Skeleton: No acute osseous abnormality identified. Upper chest: Mild gas trapping in the upper lobes. No superior mediastinal lymphadenopathy. Other neck: Negative.  No cervical lymphadenopathy. Aortic arch: 3 vessel arch configuration. Mild motion artifact. No arch atherosclerosis or great vessel origin stenosis suspected. Right carotid system: Mild motion artifact at the right CCA origin without stenosis. No atherosclerosis identified in the cervical right carotid. Widely patent right ICA origin and bulb. Left carotid system: Mild motion artifact at the left CCA origin. Minimal if any atherosclerosis in the left cervical carotid. Widely patent left ICA origin and bulb. Vertebral arteries: No proximal right subclavian artery or right vertebral artery origin plaque or stenosis. Patent right vertebral artery to the skull base without stenosis. Mild motion artifact at the proximal subclavian artery. Normal left vertebral artery origin. Tortuous left V1 segment. Fairly codominant vertebral arteries in the neck. No plaque or stenosis in the left vertebral artery to the skull base. CTA HEAD Posterior circulation: Codominant distal vertebral arteries are patent to the vertebrobasilar junction without stenosis. Patent PICA origins. Patent basilar artery without stenosis. Normal SCA and PCA origins. Posterior communicating arteries are diminutive or absent. Bilateral PCA branches are symmetric and within normal limits. Anterior circulation: The left ICA siphon is patent with mild to moderate calcified plaque in the cavernous and supraclinoid ICA segments. Possible  mild left supraclinoid segment stenosis. Patent left ICA terminus. Calcified plaque plus soft plaque or thrombus in the right ICA siphon. Short segment (3 millimeter) occlusion of the distal cavernous right ICA (series 11, image 91) with reconstituted flow at the right anterior genu and patent right supraclinoid segment. Superimposed  mild to moderate right siphon atherosclerotic stenosis. Patent right ICA terminus. Normal MCA origins. Questionable moderate stenosis of the right ACA origin. Bilateral ACA branches are within normal limits. Left MCA M1 segment, bifurcation, and left MCA branches are within normal limits. Right MCA, M1 segment bifurcation, and right MCA branches are within normal limits. No right MCA branch occlusion identified. Venous sinuses: Arterial dominant phase imaging. Grossly patent major dural venous sinuses. Anatomic variants: None. Review of the MIP images confirms the above findings IMPRESSION: 1. 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 Patent right ICA terminus and right MCA. 2. Right MCA core infarct volume underestimated by CT Perfusion, suggesting subacute ischemic timing. 3. Moderate bilateral ICA siphon atherosclerosis associated with #1. But no carotid atherosclerosis or stenosis in the neck. And no posterior circulation atherosclerosis or stenosis. 4. Study was reviewed in person with Dr. Milon Dikes on 02/28/2017 beginning at 1000 hrs. Electronically Signed   By: Odessa Fleming M.D.   On: 02/28/2017 10:19   Dg Chest Port 1 View  Result Date: 02/28/2017 CLINICAL DATA:  Headache, fever, shortness of breath. Mid chest pain. EXAM: PORTABLE CHEST 1 VIEW COMPARISON:  None FINDINGS: Cardiomegaly. Lungs are clear. No effusions. No acute bony abnormality. IMPRESSION: Cardiomegaly.  No active disease. Electronically Signed   By: Charlett Nose M.D.   On: 02/28/2017 10:37   Ct Head  Code Stroke Wo Contrast  Result Date: 02/28/2017 CLINICAL DATA:  Code stroke. 52 year old female with left side paralysis. Unknown time of onset. EXAM: CT HEAD WITHOUT CONTRAST TECHNIQUE: Contiguous axial images were obtained from the base of the skull through the vertex without intravenous contrast. COMPARISON:  None. FINDINGS: Brain: Multifocal confluent areas of gray and white matter hypodensity are scattered in the right hemisphere, with wedge-shaped peripheral areas of involvement in the right middle frontal gyrus, right parietal lobe, and superior right occipital lobe. Additional smaller areas of involvement including the superior right peri-Rolandic cortex (series 3, image 25). No acute intracranial hemorrhage identified. No associated intracranial mass effect. Gray-white matter differentiation in the right temporal lobe and right basal ganglia is preserved. Left hemisphere, thalamic, and posterior fossa gray-white matter differentiation appears normal. No ventriculomegaly.  Normal basilar cisterns. Vascular: Calcified atherosclerosis at the skull base. No suspicious intracranial vascular hyperdensity. Skull: Negative. Sinuses/Orbits: Clear. Other: Visualized orbits and scalp soft tissues are within normal limits. ASPECTS [facility] Stroke Program Early CT Score) - Ganglionic level infarction (caudate, lentiform nuclei, internal capsule, insula, M1-M3 cortex): 6 (abnormal M1 segment) - Supraganglionic infarction (M4-M6 cortex): 0 (M4 through and 6 abnormal). Total score (0-10 with 10 being normal): 6 IMPRESSION: 1. Multifocal subacute appearing cortical infarcts in the right hemisphere, primarily the right MCA territory although there is also superior right occipital lobe involvement. The right basal ganglia are spared. 2. No associated hemorrhage or intracranial mass effect. 3. ASPECTS is 6. 4. These results were communicated to Dr. Wilford Corner at 9:46 amon 1/23/2019by text page via the [facility] messaging system.  Electronically Signed   By: Odessa Fleming M.D.   On: 02/28/2017 09:47   Ellwood Dense, DO 03/01/2017, 7:22 AM PGY-1, [facility] Family Medicine FPTS Intern pager: [phone], text pages welcome

## 2017-03-01 NOTE — Evaluation (Signed)
Speech Language Pathology Evaluation Patient Details Name: Kristen Short MRN: [id number] DOB: 03-17-1965 Today's Date: 03/01/2017 Time: 1010-1053 SLP Time Calculation (min) (ACUTE ONLY): 43 min  Problem List:  Patient Active Problem List   Diagnosis Date Noted  . CVA (cerebral vascular accident) (HCC) 02/28/2017   Past Medical History: History reviewed. No pertinent past medical history. Past Surgical History: History reviewed. No pertinent surgical history. HPI:  Kristen Short a 52 y.o.femalepresenting with L-sided weakness.No significant PMH. MRI with acute large R frontal/MCA and R posterior watershed territory infarctsand subacute small RIGHT frontal lobe/MCA infarct.   Assessment / Plan / Recommendation Clinical Impression  Cognitive-linguistic evaluation complete with use of interpreter. Patient presents with left sided inattention, decreased sustained attention, short term recall of information, intellectual awareness, and reasoning skills impacting safety with ADLs. Patient will benefit from f/u skilled SLP services to address above areas, maximizing independence with ADLs.     SLP Assessment  SLP Recommendation/Assessment: Patient needs continued Speech Lanaguage Pathology Services SLP Visit Diagnosis: Cognitive communication deficit (R41.841)    Follow Up Recommendations  Inpatient Rehab    Frequency and Duration min 2x/week  2 weeks      SLP Evaluation Cognition  Overall Cognitive Status: Impaired/Different from baseline Arousal/Alertness: Awake/alert Orientation Level: Oriented to person;Oriented to place;Oriented to time;Disoriented to situation Attention: Sustained Sustained Attention: Impaired Sustained Attention Impairment: Verbal complex;Functional complex Memory: Impaired Memory Impairment: Retrieval deficit;Decreased short term memory Decreased Short Term Memory: Verbal basic Awareness: Impaired Awareness Impairment: Intellectual impairment Problem  Solving: Impaired Problem Solving Impairment: Verbal complex Executive Function: Reasoning;Decision Making Reasoning: Impaired Reasoning Impairment: Verbal complex;Functional complex Decision Making: Impaired Decision Making Impairment: Verbal complex;Functional complex Behaviors: Impulsive Safety/Judgment: Impaired       Comprehension  Auditory Comprehension Overall Auditory Comprehension: Appears within functional limits for tasks assessed Visual Recognition/Discrimination Discrimination: Within Function Limits Reading Comprehension Reading Status: Not tested    Expression Expression Primary Mode of Expression: Verbal Verbal Expression Overall Verbal Expression: Appears within functional limits for tasks assessed Written Expression Dominant Hand: Right Written Expression: Not tested   Oral / Motor  Oral Motor/Sensory Function Overall Oral Motor/Sensory Function: Moderate impairment Facial ROM: Reduced left;Suspected CN VII (facial) dysfunction Facial Symmetry: Abnormal symmetry left;Suspected CN VII (facial) dysfunction Facial Strength: Reduced left;Suspected CN VII (facial) dysfunction Facial Sensation: Within Functional Limits Lingual ROM: Reduced left;Suspected CN XII (hypoglossal) dysfunction Lingual Symmetry: Within Functional Limits Lingual Strength: Suspected CN XII (hypoglossal) dysfunction;Reduced Lingual Sensation: Within Functional Limits Velum: Within Functional Limits Mandible: Within Functional Limits Motor Speech Overall Motor Speech: Appears within functional limits for tasks assessed(per interpreter)   GO            Ferdinand LangoLeah Rey Fors MA, CCC-SLP [phone]          Ferdinand LangoMcCoy Beila Purdie Meryl 03/01/2017, 11:21 AM

## 2017-03-01 NOTE — Consult Note (Signed)
Physical Medicine and Rehabilitation Consult   Reason for Consult: Stroke with functional deficits.  Referring Physician: Dr. Jennette Kettle.    HPI: Kristen Short is a 52 y.o. female with no known prior medical history and  immigrated from [place] a year ago.  She was admitted on 02/28/17 with reports of HA since last Saturday followed by left sided weakness and difficulty walking.  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  2 D echo  Done today and work up underway. Patient with resultant dense left hemiparesis, left facial weakness, left inattention with right gaze preference, and cognitive deficits. Therapy evaluations done revealing functional deficits and CIR recommended for follow up therapy.    Sister in room helps to translate  Review of Systems  Constitutional: Negative for chills and fever.  Eyes: Positive for double vision.  Respiratory: Negative for cough and shortness of breath.   Cardiovascular: Negative for chest pain and palpitations.  Gastrointestinal: Negative for nausea and vomiting.  Musculoskeletal: Positive for myalgias (left shoulder pain).  Skin: Positive for itching (chronic problem for years).  Neurological: Positive for speech change, focal weakness and headaches.  Psychiatric/Behavioral: The patient is nervous/anxious.       Past Medical History:  Diagnosis Date  . Itching    chronic issues  . Left wrist fracture      History reviewed. No pertinent surgical history.    Family History  Problem Relation Age of Onset  . High blood pressure Sister   . Heart Problems Sister        angina due to vasospasms       Social History:  Married. She lives in [place] with husband and 2 sons  but has family in [place]. Independent and working PTA.  She does not use tobacco, alcohol or illicit drugs.     Allergies: No Known Allergies    Medications Prior to Admission  Medication Sig Dispense Refill  . acetaminophen (TYLENOL) 500 MG tablet Take 500 mg by mouth every 6 (six) hours as needed for mild pain.      Home: Home Living Family/patient expects to be discharged to:: Private residence Living Arrangements: Spouse/significant other, Children Available Help at Discharge: Family Type of Home: Apartment Home Layout: One level Additional Comments: unsure, patient independent at baseline however   Lives With: Spouse  Functional History: Prior Function Level of Independence: Independent Functional Status:  Mobility: Bed Mobility Overal bed mobility: Needs Assistance Bed Mobility: Supine to Sit Supine to sit: Min assist, +2 for safety/equipment General bed mobility comments: to bring LEs around, bring trunk up at edge of bed  Transfers Overall transfer level: Needs assistance Equipment used: None Transfers: Sit to/from Stand, Stand Pivot Transfers Sit to Stand: Mod assist, +2 safety/equipment Stand pivot transfers: Mod assist, +2 safety/equipment General transfer comment: +2 assist for safety today, patient impulsive and requiring cues for safety and sequencing  Ambulation/Gait General Gait Details: DNT today     ADL: ADL Overall ADL's : Needs assistance/impaired Eating/Feeding: Set up, Supervision/ safety Eating/Feeding Details (indicate cue type and reason): supported sitting Grooming: Moderate assistance Grooming Details (indicate cue type and reason): supported sitting Upper Body Bathing: Moderate assistance Upper Body Bathing Details (  indicate cue type and reason): supported sitting Lower Body Bathing: Maximal assistance Lower Body Bathing Details (indicate cue type and reason):  Mod +2 sit<>stand for safety Upper Body Dressing : Maximal assistance Upper Body Dressing Details (indicate cue type and reason): supported sitting Lower Body Dressing: Total assistance Lower Body Dressing Details (indicate cue type and reason): Mod +2 sit<>stand for safety Toilet Transfer: +2 for safety/equipment, Moderate assistance, Stand-pivot, BSC Toileting- Clothing Manipulation and Hygiene: Moderate assistance Toileting - Clothing Manipulation Details (indicate cue type and reason): Mod +2 sit<>stand for safety  Cognition: Cognition Overall Cognitive Status: Impaired/Different from baseline Arousal/Alertness: Awake/alert Orientation Level: Oriented to person, Oriented to place, Oriented to time, Disoriented to situation Attention: Sustained Sustained Attention: Impaired Sustained Attention Impairment: Verbal complex, Functional complex Memory: Impaired Memory Impairment: Retrieval deficit, Decreased short term memory Decreased Short Term Memory: Verbal basic Awareness: Impaired Awareness Impairment: Intellectual impairment Problem Solving: Impaired Problem Solving Impairment: Verbal complex Executive Function: Reasoning, Decision Making Reasoning: Impaired Reasoning Impairment: Verbal complex, Functional complex Decision Making: Impaired Decision Making Impairment: Verbal complex, Functional complex Behaviors: Impulsive Safety/Judgment: Impaired Cognition Arousal/Alertness: Awake/alert Behavior During Therapy: Impulsive Overall Cognitive Status: Impaired/Different from baseline Area of Impairment: Safety/judgement, Problem solving, Attention Current Attention Level: Sustained Safety/Judgement: Decreased awareness of safety, Decreased awareness of deficits Problem Solving: Requires verbal cues, Requires tactile cues, Difficulty sequencing General Comments: impulsive, does not speak English also making it difficult to assess cognition    Blood pressure (!) 151/90, pulse  90, temperature 98.9 F (37.2 C), temperature source Oral, resp. rate 18, weight 74 kg (163 lb 2.3 oz), last menstrual period 01/29/2017, SpO2 99 %. Physical Exam  Nursing note and vitals reviewed. Constitutional: She appears well-developed and well-nourished. No distress.  HENT:  Head: Normocephalic and atraumatic.  Mouth/Throat: Oropharynx is clear and moist.  Eyes: Conjunctivae are normal. Pupils are equal, round, and reactive to light.  Neck: Normal range of motion. Neck supple.  Cardiovascular: Normal rate and regular rhythm.  No murmur heard. Respiratory: Effort normal and breath sounds normal. No stridor. No respiratory distress. She has no wheezes.  GI: Soft. Bowel sounds are normal. She exhibits no distension. There is no tenderness.  Neurological: She is alert.  Restless and distracted.  Left facial weakness with left inattention. Impulsive. Able to follow simple motor commands with perseverative behaviors.   Skin: Skin is warm and dry. She is not diaphoretic.  Motor strength is 0/5 in the left upper limb, 3- in the left hip flexor knee extensor ankle dorsiflexor plantar flexor 5/5 in the right deltoid bicep tricep grip hip flexor knee extensor ankle dorsiflexor Sensation intact in the upper and lower limbs to light touch. Patient has diplopia which corrects with closure of the left eye. Left central 7 palsy  Results for orders placed or performed during the hospital encounter of 02/28/17 (from the past 24 hour(s))  Hemoglobin A1c     Status: Abnormal   Collection Time: 03/01/17  3:27 AM  Result Value Ref Range   Hgb A1c MFr Bld 7.5 (H) 4.8 - 5.6 %   Mean Plasma Glucose 168.55 mg/dL  Lipid panel     Status: Abnormal   Collection Time: 03/01/17  3:27 AM  Result Value Ref Range   Cholesterol 236 (H) 0 - 200 mg/dL   Triglycerides 811 <914 mg/dL   HDL 36 (L) >78 mg/dL   Total CHOL/HDL Ratio 6.6 RATIO   VLDL 27 0 - 40 mg/dL   LDL Cholesterol 295 (H) 0 - 99  mg/dL  Basic  metabolic panel     Status: Abnormal   Collection Time: 03/01/17  1:34 PM  Result Value Ref Range   Sodium 136 135 - 145 mmol/L   Potassium 4.0 3.5 - 5.1 mmol/L   Chloride 103 101 - 111 mmol/L   CO2 22 22 - 32 mmol/L   Glucose, Bld 191 (H) 65 - 99 mg/dL   BUN 20 6 - 20 mg/dL   Creatinine, Ser 1.61 (H) 0.44 - 1.00 mg/dL   Calcium 9.5 8.9 - 09.6 mg/dL   GFR calc non Af Amer 59 (L) >60 mL/min   GFR calc Af Amer >60 >60 mL/min   Anion gap 11 5 - 15   Ct Angio Head W Or Wo Contrast  Result Date: 02/28/2017 CLINICAL DATA:  52 year old female, code stroke. Left side paralysis, unknown time of onset. EXAM: CT ANGIOGRAPHY HEAD AND NECK CT PERFUSION BRAIN TECHNIQUE: Multidetector CT imaging of the head and neck was performed using the standard protocol during bolus administration of intravenous contrast. Multiplanar CT image reconstructions and MIPs were obtained to evaluate the vascular anatomy. Carotid stenosis measurements (when applicable) are obtained utilizing NASCET criteria, using the distal internal carotid diameter as the denominator. Multiphase CT imaging of the brain was performed following IV bolus contrast injection. Subsequent parametric perfusion maps were calculated using RAPID software. CONTRAST:  90mL ISOVUE-370 IOPAMIDOL (ISOVUE-370) INJECTION 76% COMPARISON:  Head CT without contrast 0935 hr today. FINDINGS: CT Brain Perfusion Findings: CBF (<30%) Volume: 7 mL, but underestimated due to hypodense areas of core infarct. Perfusion (Tmax>6.0s) volume: 0 milliliters Mismatch Volume: Not applicable Infarction Location:Right hemisphere. CTA NECK Skeleton: No acute osseous abnormality identified. Upper chest: Mild gas trapping in the upper lobes. No superior mediastinal lymphadenopathy. Other neck: Negative.  No cervical lymphadenopathy. Aortic arch: 3 vessel arch configuration. Mild motion artifact. No arch atherosclerosis or great vessel origin stenosis suspected. Right carotid system: Mild  motion artifact at the right CCA origin without stenosis. No atherosclerosis identified in the cervical right carotid. Widely patent right ICA origin and bulb. Left carotid system: Mild motion artifact at the left CCA origin. Minimal if any atherosclerosis in the left cervical carotid. Widely patent left ICA origin and bulb. Vertebral arteries: No proximal right subclavian artery or right vertebral artery origin plaque or stenosis. Patent right vertebral artery to the skull base without stenosis. Mild motion artifact at the proximal subclavian artery. Normal left vertebral artery origin. Tortuous left V1 segment. Fairly codominant vertebral arteries in the neck. No plaque or stenosis in the left vertebral artery to the skull base. CTA HEAD Posterior circulation: Codominant distal vertebral arteries are patent to the vertebrobasilar junction without stenosis. Patent PICA origins. Patent basilar artery without stenosis. Normal SCA and PCA origins. Posterior communicating arteries are diminutive or absent. Bilateral PCA branches are symmetric and within normal limits. Anterior circulation: The left ICA siphon is patent with mild to moderate calcified plaque in the cavernous and supraclinoid ICA segments. Possible mild left supraclinoid segment stenosis. Patent left ICA terminus. Calcified plaque plus soft plaque or thrombus in the right ICA siphon. Short segment (3 millimeter) occlusion of the distal cavernous right ICA (series 11, image 91) with reconstituted flow at the right anterior genu and patent right supraclinoid segment. Superimposed mild to moderate right siphon atherosclerotic stenosis. Patent right ICA terminus. Normal MCA origins. Questionable moderate stenosis of the right ACA origin. Bilateral ACA branches are within normal limits. Left MCA M1 segment, bifurcation, and left MCA branches are  within normal limits. Right MCA, M1 segment bifurcation, and right MCA branches are within normal limits. No right  MCA branch occlusion identified. Venous sinuses: Arterial dominant phase imaging. Grossly patent major dural venous sinuses. Anatomic variants: None. Review of the MIP images confirms the above findings IMPRESSION: 1. 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 Patent right ICA terminus and right MCA. 2. Right MCA core infarct volume underestimated by CT Perfusion, suggesting subacute ischemic timing. 3. Moderate bilateral ICA siphon atherosclerosis associated with #1. But no carotid atherosclerosis or stenosis in the neck. And no posterior circulation atherosclerosis or stenosis. 4. Study was reviewed in person with Dr. Milon Dikes on 02/28/2017 beginning at 1000 hrs. Electronically Signed   By: Odessa Fleming M.D.   On: 02/28/2017 10:19   Ct Angio Neck W Or Wo Contrast  Result Date: 02/28/2017 CLINICAL DATA:  52 year old female, code stroke. Left side paralysis, unknown time of onset. EXAM: CT ANGIOGRAPHY HEAD AND NECK CT PERFUSION BRAIN TECHNIQUE: Multidetector CT imaging of the head and neck was performed using the standard protocol during bolus administration of intravenous contrast. Multiplanar CT image reconstructions and MIPs were obtained to evaluate the vascular anatomy. Carotid stenosis measurements (when applicable) are obtained utilizing NASCET criteria, using the distal internal carotid diameter as the denominator. Multiphase CT imaging of the brain was performed following IV bolus contrast injection. Subsequent parametric perfusion maps were calculated using RAPID software. CONTRAST:  90mL ISOVUE-370 IOPAMIDOL (ISOVUE-370) INJECTION 76% COMPARISON:  Head CT without contrast 0935 hr today. FINDINGS: CT Brain Perfusion Findings: CBF (<30%) Volume: 7 mL, but underestimated due to hypodense areas of core infarct. Perfusion (Tmax>6.0s) volume: 0 milliliters Mismatch Volume: Not applicable Infarction  Location:Right hemisphere. CTA NECK Skeleton: No acute osseous abnormality identified. Upper chest: Mild gas trapping in the upper lobes. No superior mediastinal lymphadenopathy. Other neck: Negative.  No cervical lymphadenopathy. Aortic arch: 3 vessel arch configuration. Mild motion artifact. No arch atherosclerosis or great vessel origin stenosis suspected. Right carotid system: Mild motion artifact at the right CCA origin without stenosis. No atherosclerosis identified in the cervical right carotid. Widely patent right ICA origin and bulb. Left carotid system: Mild motion artifact at the left CCA origin. Minimal if any atherosclerosis in the left cervical carotid. Widely patent left ICA origin and bulb. Vertebral arteries: No proximal right subclavian artery or right vertebral artery origin plaque or stenosis. Patent right vertebral artery to the skull base without stenosis. Mild motion artifact at the proximal subclavian artery. Normal left vertebral artery origin. Tortuous left V1 segment. Fairly codominant vertebral arteries in the neck. No plaque or stenosis in the left vertebral artery to the skull base. CTA HEAD Posterior circulation: Codominant distal vertebral arteries are patent to the vertebrobasilar junction without stenosis. Patent PICA origins. Patent basilar artery without stenosis. Normal SCA and PCA origins. Posterior communicating arteries are diminutive or absent. Bilateral PCA branches are symmetric and within normal limits. Anterior circulation: The left ICA siphon is patent with mild to moderate calcified plaque in the cavernous and supraclinoid ICA segments. Possible mild left supraclinoid segment stenosis. Patent left ICA terminus. Calcified plaque plus soft plaque or thrombus in the right ICA siphon. Short segment (3 millimeter) occlusion of the distal cavernous right ICA (series 11, image 91) with reconstituted flow at the right anterior genu and patent right supraclinoid segment.  Superimposed mild to moderate right siphon atherosclerotic stenosis. Patent right  ICA terminus. Normal MCA origins. Questionable moderate stenosis of the right ACA origin. Bilateral ACA branches are within normal limits. Left MCA M1 segment, bifurcation, and left MCA branches are within normal limits. Right MCA, M1 segment bifurcation, and right MCA branches are within normal limits. No right MCA branch occlusion identified. Venous sinuses: Arterial dominant phase imaging. Grossly patent major dural venous sinuses. Anatomic variants: None. Review of the MIP images confirms the above findings IMPRESSION: 1. 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 Patent right ICA terminus and right MCA. 2. Right MCA core infarct volume underestimated by CT Perfusion, suggesting subacute ischemic timing. 3. Moderate bilateral ICA siphon atherosclerosis associated with #1. But no carotid atherosclerosis or stenosis in the neck. And no posterior circulation atherosclerosis or stenosis. 4. Study was reviewed in person with Dr. Milon DikesASHISH ARORA on 02/28/2017 beginning at 1000 hrs. Electronically Signed   By: Odessa FlemingH  Hall M.D.   On: 02/28/2017 10:19   Mr Brain Wo Contrast  Result Date: 02/28/2017 CLINICAL DATA:  Altered mental status, TIA. LEFT-sided weakness, follow-up acute RIGHT MCA and PCA territory infarcts. EXAM: MRI HEAD WITHOUT CONTRAST TECHNIQUE: Axial and coronal diffusion weighted imaging. Examination was terminated, per technologist note, patient began kicking and screaming. COMPARISON:  CT/CTA HEAD and neck February 28, 2017 FINDINGS: Confluent RIGHT frontal lobe reduced diffusion extending to the insula with low ADC values. Patchy discontinuous RIGHT frontal lobe reduced diffusion with normalized ADC values. Confluent RIGHT parietooccipital and subcentimeter RIGHT posterior temporal lobe foci of reduced diffusion with low  ADC values. Regional mass effect without midline shift. No hydrocephalus. No abnormal extra-axial fluid collections. IMPRESSION: 1. Limited 2 sequence diffusion-weighted imaging examination. 2. Acute large RIGHT frontal/MCA and RIGHT posterior watershed territory infarcts. 3. Subacute small RIGHT frontal lobe/MCA infarct. Electronically Signed   By: Awilda Metroourtnay  Bloomer M.D.   On: 02/28/2017 22:28   Ct Cerebral Perfusion W Contrast  Result Date: 02/28/2017 CLINICAL DATA:  52 year old female, code stroke. Left side paralysis, unknown time of onset. EXAM: CT ANGIOGRAPHY HEAD AND NECK CT PERFUSION BRAIN TECHNIQUE: Multidetector CT imaging of the head and neck was performed using the standard protocol during bolus administration of intravenous contrast. Multiplanar CT image reconstructions and MIPs were obtained to evaluate the vascular anatomy. Carotid stenosis measurements (when applicable) are obtained utilizing NASCET criteria, using the distal internal carotid diameter as the denominator. Multiphase CT imaging of the brain was performed following IV bolus contrast injection. Subsequent parametric perfusion maps were calculated using RAPID software. CONTRAST:  90mL ISOVUE-370 IOPAMIDOL (ISOVUE-370) INJECTION 76% COMPARISON:  Head CT without contrast 0935 hr today. FINDINGS: CT Brain Perfusion Findings: CBF (<30%) Volume: 7 mL, but underestimated due to hypodense areas of core infarct. Perfusion (Tmax>6.0s) volume: 0 milliliters Mismatch Volume: Not applicable Infarction Location:Right hemisphere. CTA NECK Skeleton: No acute osseous abnormality identified. Upper chest: Mild gas trapping in the upper lobes. No superior mediastinal lymphadenopathy. Other neck: Negative.  No cervical lymphadenopathy. Aortic arch: 3 vessel arch configuration. Mild motion artifact. No arch atherosclerosis or great vessel origin stenosis suspected. Right carotid system: Mild motion artifact at the right CCA origin without stenosis. No  atherosclerosis identified in the cervical right carotid. Widely patent right ICA origin and bulb. Left carotid system: Mild motion artifact at the left CCA origin. Minimal if any atherosclerosis in the left cervical carotid. Widely patent left ICA origin and bulb. Vertebral arteries: No proximal right subclavian  artery or right vertebral artery origin plaque or stenosis. Patent right vertebral artery to the skull base without stenosis. Mild motion artifact at the proximal subclavian artery. Normal left vertebral artery origin. Tortuous left V1 segment. Fairly codominant vertebral arteries in the neck. No plaque or stenosis in the left vertebral artery to the skull base. CTA HEAD Posterior circulation: Codominant distal vertebral arteries are patent to the vertebrobasilar junction without stenosis. Patent PICA origins. Patent basilar artery without stenosis. Normal SCA and PCA origins. Posterior communicating arteries are diminutive or absent. Bilateral PCA branches are symmetric and within normal limits. Anterior circulation: The left ICA siphon is patent with mild to moderate calcified plaque in the cavernous and supraclinoid ICA segments. Possible mild left supraclinoid segment stenosis. Patent left ICA terminus. Calcified plaque plus soft plaque or thrombus in the right ICA siphon. Short segment (3 millimeter) occlusion of the distal cavernous right ICA (series 11, image 91) with reconstituted flow at the right anterior genu and patent right supraclinoid segment. Superimposed mild to moderate right siphon atherosclerotic stenosis. Patent right ICA terminus. Normal MCA origins. Questionable moderate stenosis of the right ACA origin. Bilateral ACA branches are within normal limits. Left MCA M1 segment, bifurcation, and left MCA branches are within normal limits. Right MCA, M1 segment bifurcation, and right MCA branches are within normal limits. No right MCA branch occlusion identified. Venous sinuses: Arterial  dominant phase imaging. Grossly patent major dural venous sinuses. Anatomic variants: None. Review of the MIP images confirms the above findings IMPRESSION: 1. 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 Patent right ICA terminus and right MCA. 2. Right MCA core infarct volume underestimated by CT Perfusion, suggesting subacute ischemic timing. 3. Moderate bilateral ICA siphon atherosclerosis associated with #1. But no carotid atherosclerosis or stenosis in the neck. And no posterior circulation atherosclerosis or stenosis. 4. Study was reviewed in person with Dr. Milon Dikes on 02/28/2017 beginning at 1000 hrs. Electronically Signed   By: Odessa Fleming M.D.   On: 02/28/2017 10:19   Dg Chest Port 1 View  Result Date: 02/28/2017 CLINICAL DATA:  Headache, fever, shortness of breath. Mid chest pain. EXAM: PORTABLE CHEST 1 VIEW COMPARISON:  None FINDINGS: Cardiomegaly. Lungs are clear. No effusions. No acute bony abnormality. IMPRESSION: Cardiomegaly.  No active disease. Electronically Signed   By: Charlett Nose M.D.   On: 02/28/2017 10:37   Ct Head Code Stroke Wo Contrast  Result Date: 02/28/2017 CLINICAL DATA:  Code stroke. 52 year old female with left side paralysis. Unknown time of onset. EXAM: CT HEAD WITHOUT CONTRAST TECHNIQUE: Contiguous axial images were obtained from the base of the skull through the vertex without intravenous contrast. COMPARISON:  None. FINDINGS: Brain: Multifocal confluent areas of gray and white matter hypodensity are scattered in the right hemisphere, with wedge-shaped peripheral areas of involvement in the right middle frontal gyrus, right parietal lobe, and superior right occipital lobe. Additional smaller areas of involvement including the superior right peri-Rolandic cortex (series 3, image 25). No acute intracranial hemorrhage identified. No associated intracranial mass  effect. Gray-white matter differentiation in the right temporal lobe and right basal ganglia is preserved. Left hemisphere, thalamic, and posterior fossa gray-white matter differentiation appears normal. No ventriculomegaly.  Normal basilar cisterns. Vascular: Calcified atherosclerosis at the skull base. No suspicious intracranial vascular hyperdensity. Skull: Negative. Sinuses/Orbits: Clear. Other: Visualized orbits and scalp soft tissues are within normal limits. ASPECTS [facility] Stroke  Program Early CT Score) - Ganglionic level infarction (caudate, lentiform nuclei, internal capsule, insula, M1-M3 cortex): 6 (abnormal M1 segment) - Supraganglionic infarction (M4-M6 cortex): 0 (M4 through and 6 abnormal). Total score (0-10 with 10 being normal): 6 IMPRESSION: 1. Multifocal subacute appearing cortical infarcts in the right hemisphere, primarily the right MCA territory although there is also superior right occipital lobe involvement. The right basal ganglia are spared. 2. No associated hemorrhage or intracranial mass effect. 3. ASPECTS is 6. 4. These results were communicated to Dr. Wilford Corner at 9:46 amon 1/23/2019by text page via the [facility] messaging system. Electronically Signed   By: Odessa Fleming M.D.   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 Does the need for close, 24 hr/day medical supervision in concert with the patient's rehab needs make it unreasonable for this patient to be served in a less intensive setting? Yes 2. Co-Morbidities requiring supervision/potential complications: Hypertension, tachycardia 3. Due to bladder management, bowel management, safety, skin/wound care, disease management, medication administration, pain management and patient education, does the patient require 24 hr/day rehab nursing? Yes 4. Does the patient require coordinated care of a  physician, rehab nurse, PT (1-2 hrs/day, 5 days/week), OT (1-2 hrs/day, 5 days/week) and SLP (.5-1 hrs/day, 5 days/week) to address physical and functional deficits in the context of the above medical diagnosis(es)? Yes Addressing deficits in the following areas: balance, endurance, locomotion, strength, transferring, bowel/bladder control, bathing, dressing, feeding, grooming, toileting, cognition and psychosocial support 5. Can the patient actively participate in an intensive therapy program of at least 3 hrs of therapy per day at least 5 days per week? Yes 6. The potential for patient to make measurable gains while on inpatient rehab is good 7. Anticipated functional outcomes upon discharge from inpatient rehab are min assist  with PT, min assist with OT, min assist with SLP. 8. Estimated rehab length of stay to reach the above functional goals is: 19-21d 9. Anticipated D/C setting: Home 10. Anticipated post D/C treatments: HH therapy 11. Overall Rehab/Functional Prognosis: good  RECOMMENDATIONS: This patient's condition is appropriate for continued rehabilitative care in the following setting: CIR Patient has agreed to participate in recommended program. Yes Note that insurance prior authorization may be required for reimbursement for recommended care.  Comment:   Erick Colace M.D. [facility] FAAPM&R (Sports Med, Neuromuscular Med) Diplomate Am Board of Electrodiagnostic Med  Jacquelynn Cree, PA-C 03/01/2017

## 2017-03-01 NOTE — Evaluation (Signed)
Occupational Therapy Evaluation Patient Details Name: Kristen BaltimoreHwoc Susi MRN: [id number] DOB: 05-28-1965 Today's Date: 03/01/2017    History of Present Illness 51yo female presenting with L sided weakness, no significant PMH. Imaging studies show acute CVA involving R MCA and PCA. No TPA was given. Significant L UE weakness (0/5) and L LE weakness (3/5), L facial droop, possible L HH noted. She is from [place], speaks [place] but no english.    Clinical Impression   This 52 yo female admitted with above presents to acute OT with impulsiveness, left inattention, decreased ability to move eyes to left, dense hemiplegia LUE, moderate LLE, decreased sitting and standing balance, decreased awareness of deficits all affecting her PLOF of being totally independent including working. She will benefit from acute OT with follow up OT on CIR.    Follow Up Recommendations  CIR;Supervision/Assistance - 24 hour    Equipment Recommendations  Other (comment)(TBD at next venue)       Precautions / Restrictions Precautions Precautions: Fall;Other (comment) Precaution Comments: No UE movement /moderate L LE weakness, L inattention, impulsive  Restrictions Weight Bearing Restrictions: No      Mobility Bed Mobility Overal bed mobility: Needs Assistance Bed Mobility: Supine to Sit     Supine to sit: Min assist;+2 for safety/equipment     General bed mobility comments: to bring LEs around, bring trunk up at edge of bed   Transfers Overall transfer level: Needs assistance Equipment used: None Transfers: Sit to/from [employer];Stand Pivot Transfers Sit to Stand: Mod assist;+2 safety/equipment Stand pivot transfers: Mod assist;+2 safety/equipment       General transfer comment: +2 assist for safety today, patient impulsive and requiring cues for safety and sequencing     Balance Overall balance assessment: Needs assistance Sitting-balance support: Feet supported Sitting balance-Leahy Scale:  Fair Sitting balance - Comments: general mild unsteadiness requiring constant min guard to MinA     Standing balance-Leahy Scale: Poor Standing balance comment: reliant on assistance to maintain balance during transfer                            ADL either performed or assessed with clinical judgement   ADL Overall ADL's : Needs assistance/impaired Eating/Feeding: Set up;Supervision/ safety Eating/Feeding Details (indicate cue type and reason): supported sitting Grooming: Moderate assistance Grooming Details (indicate cue type and reason): supported sitting Upper Body Bathing: Moderate assistance Upper Body Bathing Details (indicate cue type and reason): supported sitting Lower Body Bathing: Maximal assistance Lower Body Bathing Details (indicate cue type and reason): Mod +2 sit<>stand for safety Upper Body Dressing : Maximal assistance Upper Body Dressing Details (indicate cue type and reason): supported sitting Lower Body Dressing: Total assistance Lower Body Dressing Details (indicate cue type and reason): Mod +2 sit<>stand for safety Toilet Transfer: +2 for safety/equipment;Moderate assistance;Stand-pivot;BSC   Toileting- Clothing Manipulation and Hygiene: Moderate assistance Toileting - Clothing Manipulation Details (indicate cue type and reason): Mod +2 sit<>stand for safety             Vision Baseline Vision/History: No visual deficits Vision Assessment?: Vision impaired- to be further tested in functional context Additional Comments: Pt with right gaze and head turn preference, did not turn head or eyes to or past midline to left            Pertinent Vitals/Pain Pain Assessment: 0-10 Pain Score: 7  Faces Pain Scale: Hurts little more Pain Location: left head Pain Descriptors / Indicators: Aching Pain Intervention(s): Patient requesting  pain meds-RN notified     Hand Dominance Right   Extremity/Trunk Assessment Upper Extremity Assessment Upper  Extremity Assessment: LUE deficits/detail LUE Deficits / Details: Flaccid, PROM WNL, pt reports feels numb, left inattention LUE Coordination: decreased fine motor;decreased gross motor   Lower Extremity Assessment Lower Extremity Assessment: LLE deficits/detail LLE Deficits / Details: L LE strength approximately 3-/5  LLE Sensation: decreased proprioception   Cervical / Trunk Assessment Cervical / Trunk Assessment: Normal   Communication Communication Communication: Prefers language other than English;Interpreter utilized(vietnamese)   Cognition Arousal/Alertness: Awake/alert Behavior During Therapy: Impulsive Overall Cognitive Status: Impaired/Different from baseline Area of Impairment: Safety/judgement;Problem solving;Attention                   Current Attention Level: Sustained     Safety/Judgement: Decreased awareness of safety;Decreased awareness of deficits   Problem Solving: Requires verbal cues;Requires tactile cues;Difficulty sequencing General Comments: impulsive, does not speak English also making it difficult to assess cognition    General Comments  impulsive, L inattention due to possible HH, numbness L LE/nerve pain L UE, reduced awareness of impairments, poor safety awareness             Home Living Family/patient expects to be discharged to:: Private residence Living Arrangements: Spouse/significant other;Children Available Help at Discharge: Family Type of Home: Apartment       Home Layout: One level                   Additional Comments: unsure, patient independent at baseline however   Lives With: Spouse    Prior Functioning/Environment Level of Independence: Independent                 OT Problem List: Decreased strength;Impaired balance (sitting and/or standing);Impaired vision/perception;Decreased coordination;Decreased cognition;Decreased safety awareness;Decreased knowledge of use of DME or AE;Impaired  sensation;Impaired tone;Impaired UE functional use;Pain      OT Treatment/Interventions: Self-care/ADL training;Balance training;Neuromuscular education;Therapeutic activities;Cognitive remediation/compensation;Visual/perceptual remediation/compensation;DME and/or AE instruction;Patient/family education    OT Goals(Current goals can be found in the care plan section) Acute Rehab OT Goals Patient Stated Goal: to get better OT Goal Formulation: With patient/family Time For Goal Achievement: 03/15/17  OT Frequency: Min 3X/week   Barriers to D/C: Decreased caregiver support          Co-evaluation PT/OT/SLP Co-Evaluation/Treatment: Yes Reason for Co-Treatment: Complexity of the patient's impairments (multi-system involvement);For patient/therapist safety;To address functional/ADL transfers;Necessary to address cognition/behavior during functional activity   OT goals addressed during session: ADL's and self-care;Strengthening/ROM      AM-PAC PT "6 Clicks" Daily Activity     Outcome Measure Help from another person eating meals?: A Little Help from another person taking care of personal grooming?: A Lot Help from another person toileting, which includes using toliet, bedpan, or urinal?: A Lot Help from another person bathing (including washing, rinsing, drying)?: A Lot Help from another person to put on and taking off regular upper body clothing?: A Lot Help from another person to put on and taking off regular lower body clothing?: Total 6 Click Score: 12   End of Session Equipment Utilized During Treatment: Gait belt Nurse Communication: Mobility status;Need for lift equipment(sara stedy (NT and RN))  Activity Tolerance: Patient tolerated treatment well Patient left: in chair;with call bell/phone within reach;with chair alarm set  OT Visit Diagnosis: Unsteadiness on feet (R26.81);Other abnormalities of gait and mobility (R26.89);Muscle weakness (generalized) (M62.81);Low vision, both  eyes (H54.2);Other symptoms and signs involving cognitive function;Hemiplegia and hemiparesis;Pain Hemiplegia - Right/Left: Left  Hemiplegia - dominant/non-dominant: Non-Dominant Hemiplegia - caused by: Cerebral infarction Pain - part of body: (headache)                Time: 1610-9604 OT Time Calculation (min): 46 min Charges:  OT General Charges $OT Visit: 1 Visit OT Evaluation $OT Eval Moderate Complexity: [address], [place] [phone] 03/01/2017

## 2017-03-02 DIAGNOSIS — R1310 Dysphagia, unspecified: Secondary | ICD-10-CM

## 2017-03-02 LAB — BASIC METABOLIC PANEL
ANION GAP: 14 (ref 5–15)
BUN: 22 mg/dL — AB (ref 6–20)
CALCIUM: 9.7 mg/dL (ref 8.9–10.3)
CO2: 23 mmol/L (ref 22–32)
Chloride: 104 mmol/L (ref 101–111)
Creatinine, Ser: 1.06 mg/dL — ABNORMAL HIGH (ref 0.44–1.00)
GFR calc Af Amer: >60 mL/min (ref >60)
GFR calc non Af Amer: 59 mL/min — ABNORMAL LOW (ref >60)
GLUCOSE: 128 mg/dL — AB (ref 65–99)
Potassium: 4.1 mmol/L (ref 3.5–5.1)
Sodium: 141 mmol/L (ref 135–145)

## 2017-03-02 LAB — GLUCOSE, CAPILLARY
GLUCOSE-CAPILLARY: 134 mg/dL — AB (ref 65–99)
Glucose-Capillary: 164 mg/dL — ABNORMAL HIGH (ref 65–99)
Glucose-Capillary: 148 mg/dL — ABNORMAL HIGH (ref 65–99)
Glucose-Capillary: 114 mg/dL — ABNORMAL HIGH (ref 65–99)

## 2017-03-02 LAB — BETA HCG QUANT (REF LAB): hCG Quant: 5 m[IU]/mL

## 2017-03-02 NOTE — Progress Notes (Signed)
SLP Cancellation Note  Patient Details Name: Patsy BaltimoreHwoc Petti MRN: [id number] DOB: 1965-09-22   Cancelled treatment:       Reason Eval/Treat Not Completed: Patient's level of consciousness.  Pt sleeping soundly upon therapist's arrival.  Only briefly awakened to voice and light touch.  Will continue efforts as pt is available and able to participate in therapy.     Hazell Siwik, Melanee SpryNicole L 03/02/2017, 2:22 PM

## 2017-03-02 NOTE — Progress Notes (Signed)
Family Medicine Teaching Service Daily Progress Note Intern Pager: [phone]  Patient name: Kristen Short Medical record number: [id number] Date of birth: 1965-07-26 Age: 52 y.o. Gender: female  Primary Care Provider: Patient, No Pcp Per Consultants: Neuro Code Status: Full  Pt Overview and Major Events to Date:  1/23 - admitted for stroke, L sided weakness/inattentiveness  Assessment and Plan: Kristen Short is a 52 y.o. female presenting with L-sided weakness. No significant PMH.   L-sided weakness with R MCA stroke: CTA head/neck with acute ischemic stroke involving R MCA and PCA territory.  Did not receive tPA as out of treatment window on presentation. Significant persistent LUE weakness (0/5 strength), with accompanying LLE weakness (3/5) and L-sided facial droop. 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 EKG sinus rhythm with borderline prolonged PR but no arrhythmias noted. UDS negative. Risk strat labs: LDL 173, A1c 7.5. MRI with a cute large R frontal/MCA and R posterior watershed territory infarcts and subacute small RIGHT frontal lobe/MCA infarct. ECHO EF 55-60%, no cardiac source of emboli. Plan for TEE and loop recorder placement Monday. Normotensive Bps, VSS overnight.  - Neuro following, appreciate recs - TEE  - Telemetry - Neuro checks q2 - Continue aspirin 325mg , atorvastatin 80mg  daily - PT/OT - recommend CIR - Carotid dopplers not necessary as CTA neck performed - Will need to establish with PCP likely in [place] area upon discharge - CM consult  New-Onset Type 2 Diabetes A1c on admission 7.5, no previous PMH or medical care so unclear how long CBGs elevated. CBGs 130-160s overnight. Received 5 units insulin in the last 24 hours. - sSSI AC & qHS  FEN/GI: Dysphagia 3 Prophylaxis: Lovenox  Disposition: continue inpatient management of stroke, then CIR  Subjective:  Patient endorsed double vision yesterday afternoon, however resolved this  morning. This morning endorsing L leg cramps that seem to be chronic in nature, stated have persisted for 3-4 years. Endorsing chronic itching, has not received PRN eucerin cream.  Objective: Temp:  [98.2 F (36.8 C)-99.6 F (37.6 C)] 98.2 F (36.8 C) (01/25 0505) Pulse Rate:  [78-92] 89 (01/25 0505) Resp:  [16-18] 18 (01/25 0505) BP: (127-157)/(76-90) 127/78 (01/25 0505) SpO2:  [99 %] 99 % (01/25 0505) Weight:  [163 lb 2.3 oz (74 kg)] 163 lb 2.3 oz (74 kg) (01/24 1116) Physical Exam: General: Female lying in bed with assistance from husband, in NAD Cardiovascular: RRR, no murmur/rub/gallops Respiratory: CTAB, no wheezes/rales/rhonchi Abdomen: soft, NTND, +BS Extremities: warm and well perfused. MSK: Strength 0/5 LUE including grip, 2/5 LLE, 5/5 RUE including grip, 5/5 RLE. No edema. Pain with palpation of L calf, no warmth, erythema, swelling.  Neuro: Alert and oriented, nonslurred speech. Difficult to assess optic field. EOMI again did not track L sided visual field. Sensation intact symmetrically to face and extremities bilaterally. Hearing grossly intact bilaterally.  Tongue protrudes normally with no deviation.  Shoulder shrug decreased on the L. L sided facial droop.  Laboratory: Recent Labs  Lab 02/28/17 0919 02/28/17 0941  WBC 9.5  --   HGB 16.1* 17.7*  HCT 49.9* 52.0*  PLT 221  --    Recent Labs  Lab 02/28/17 0919 02/28/17 0941 03/01/17 1334 03/02/17 0529  NA 139 139 136 141  K 4.4 5.2* 4.0 4.1  CL 102 104 103 104  CO2 24  --  22 23  BUN 10 15 20  22*  CREATININE 1.02* 0.90 1.07* 1.06*  CALCIUM 9.6  --  9.5 9.7  PROT 8.0  --   --   --  BILITOT 0.6  --   --   --   ALKPHOS 76  --   --   --   ALT 16  --   --   --   AST 21  --   --   --   GLUCOSE 193* 194* 191* 128*   Imaging/Diagnostic Tests: Ct Angio Head W Or Wo Contrast  Result Date: 02/28/2017 CLINICAL DATA:  52 year old female, code stroke. Left side paralysis, unknown time of onset. EXAM: CT  ANGIOGRAPHY HEAD AND NECK CT PERFUSION BRAIN TECHNIQUE: Multidetector CT imaging of the head and neck was performed using the standard protocol during bolus administration of intravenous contrast. Multiplanar CT image reconstructions and MIPs were obtained to evaluate the vascular anatomy. Carotid stenosis measurements (when applicable) are obtained utilizing NASCET criteria, using the distal internal carotid diameter as the denominator. Multiphase CT imaging of the brain was performed following IV bolus contrast injection. Subsequent parametric perfusion maps were calculated using RAPID software. CONTRAST:  90mL ISOVUE-370 IOPAMIDOL (ISOVUE-370) INJECTION 76% COMPARISON:  Head CT without contrast 0935 hr today. FINDINGS: CT Brain Perfusion Findings: CBF (<30%) Volume: 7 mL, but underestimated due to hypodense areas of core infarct. Perfusion (Tmax>6.0s) volume: 0 milliliters Mismatch Volume: Not applicable Infarction Location:Right hemisphere. CTA NECK Skeleton: No acute osseous abnormality identified. Upper chest: Mild gas trapping in the upper lobes. No superior mediastinal lymphadenopathy. Other neck: Negative.  No cervical lymphadenopathy. Aortic arch: 3 vessel arch configuration. Mild motion artifact. No arch atherosclerosis or great vessel origin stenosis suspected. Right carotid system: Mild motion artifact at the right CCA origin without stenosis. No atherosclerosis identified in the cervical right carotid. Widely patent right ICA origin and bulb. Left carotid system: Mild motion artifact at the left CCA origin. Minimal if any atherosclerosis in the left cervical carotid. Widely patent left ICA origin and bulb. Vertebral arteries: No proximal right subclavian artery or right vertebral artery origin plaque or stenosis. Patent right vertebral artery to the skull base without stenosis. Mild motion artifact at the proximal subclavian artery. Normal left vertebral artery origin. Tortuous left V1 segment. Fairly  codominant vertebral arteries in the neck. No plaque or stenosis in the left vertebral artery to the skull base. CTA HEAD Posterior circulation: Codominant distal vertebral arteries are patent to the vertebrobasilar junction without stenosis. Patent PICA origins. Patent basilar artery without stenosis. Normal SCA and PCA origins. Posterior communicating arteries are diminutive or absent. Bilateral PCA branches are symmetric and within normal limits. Anterior circulation: The left ICA siphon is patent with mild to moderate calcified plaque in the cavernous and supraclinoid ICA segments. Possible mild left supraclinoid segment stenosis. Patent left ICA terminus. Calcified plaque plus soft plaque or thrombus in the right ICA siphon. Short segment (3 millimeter) occlusion of the distal cavernous right ICA (series 11, image 91) with reconstituted flow at the right anterior genu and patent right supraclinoid segment. Superimposed mild to moderate right siphon atherosclerotic stenosis. Patent right ICA terminus. Normal MCA origins. Questionable moderate stenosis of the right ACA origin. Bilateral ACA branches are within normal limits. Left MCA M1 segment, bifurcation, and left MCA branches are within normal limits. Right MCA, M1 segment bifurcation, and right MCA branches are within normal limits. No right MCA branch occlusion identified. Venous sinuses: Arterial dominant phase imaging. Grossly patent major dural venous sinuses. Anatomic variants: None. Review of the MIP images confirms the above findings IMPRESSION: 1. 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 Patent right ICA terminus and right MCA. 2. Right MCA core infarct volume underestimated by CT Perfusion, suggesting subacute ischemic timing. 3. Moderate bilateral ICA siphon atherosclerosis associated with #1. But no carotid atherosclerosis or stenosis in  the neck. And no posterior circulation atherosclerosis or stenosis. 4. Study was reviewed in person with Dr. Milon Dikes on 02/28/2017 beginning at 1000 hrs. Electronically Signed   By: Odessa Fleming M.D.   On: 02/28/2017 10:19   Ct Angio Neck W Or Wo Contrast  Result Date: 02/28/2017 CLINICAL DATA:  52 year old female, code stroke. Left side paralysis, unknown time of onset. EXAM: CT ANGIOGRAPHY HEAD AND NECK CT PERFUSION BRAIN TECHNIQUE: Multidetector CT imaging of the head and neck was performed using the standard protocol during bolus administration of intravenous contrast. Multiplanar CT image reconstructions and MIPs were obtained to evaluate the vascular anatomy. Carotid stenosis measurements (when applicable) are obtained utilizing NASCET criteria, using the distal internal carotid diameter as the denominator. Multiphase CT imaging of the brain was performed following IV bolus contrast injection. Subsequent parametric perfusion maps were calculated using RAPID software. CONTRAST:  90mL ISOVUE-370 IOPAMIDOL (ISOVUE-370) INJECTION 76% COMPARISON:  Head CT without contrast 0935 hr today. FINDINGS: CT Brain Perfusion Findings: CBF (<30%) Volume: 7 mL, but underestimated due to hypodense areas of core infarct. Perfusion (Tmax>6.0s) volume: 0 milliliters Mismatch Volume: Not applicable Infarction Location:Right hemisphere. CTA NECK Skeleton: No acute osseous abnormality identified. Upper chest: Mild gas trapping in the upper lobes. No superior mediastinal lymphadenopathy. Other neck: Negative.  No cervical lymphadenopathy. Aortic arch: 3 vessel arch configuration. Mild motion artifact. No arch atherosclerosis or great vessel origin stenosis suspected. Right carotid system: Mild motion artifact at the right CCA origin without stenosis. No atherosclerosis identified in the cervical right carotid. Widely patent right ICA origin and bulb. Left carotid system: Mild motion artifact at the left CCA origin. Minimal if any  atherosclerosis in the left cervical carotid. Widely patent left ICA origin and bulb. Vertebral arteries: No proximal right subclavian artery or right vertebral artery origin plaque or stenosis. Patent right vertebral artery to the skull base without stenosis. Mild motion artifact at the proximal subclavian artery. Normal left vertebral artery origin. Tortuous left V1 segment. Fairly codominant vertebral arteries in the neck. No plaque or stenosis in the left vertebral artery to the skull base. CTA HEAD Posterior circulation: Codominant distal vertebral arteries are patent to the vertebrobasilar junction without stenosis. Patent PICA origins. Patent basilar artery without stenosis. Normal SCA and PCA origins. Posterior communicating arteries are diminutive or absent. Bilateral PCA branches are symmetric and within normal limits. Anterior circulation: The left ICA siphon is patent with mild to moderate calcified plaque in the cavernous and supraclinoid ICA segments. Possible mild left supraclinoid segment stenosis. Patent left ICA terminus. Calcified plaque plus soft plaque or thrombus in the right ICA siphon. Short segment (3 millimeter) occlusion of the distal cavernous right ICA (series 11, image 91) with reconstituted flow at the right anterior genu and patent right supraclinoid segment. Superimposed mild to moderate right siphon atherosclerotic stenosis. Patent right ICA terminus. Normal MCA origins. Questionable moderate stenosis of the right ACA origin. Bilateral ACA branches are within normal limits. Left MCA M1 segment, bifurcation, and left MCA branches are within normal limits. Right MCA, M1 segment bifurcation, and right MCA branches are within normal limits. No right MCA branch occlusion identified. Venous sinuses: Arterial dominant phase imaging. Grossly patent major dural venous sinuses. Anatomic variants:  None. Review of the MIP images confirms the above findings IMPRESSION: 1. Positive for large  vessel occlusion of the right ICA siphon: there is a short 3 mm distal cavernous segment of right ICA thrombosis with reconstitution. Patent right ICA terminus and right MCA. 2. Right MCA core infarct volume underestimated by CT Perfusion, suggesting subacute ischemic timing. 3. Moderate bilateral ICA siphon atherosclerosis associated with #1. But no carotid atherosclerosis or stenosis in the neck. And no posterior circulation atherosclerosis or stenosis. 4. Study was reviewed in person with Dr. Milon Dikes on 02/28/2017 beginning at 1000 hrs. Electronically Signed   By: Odessa Fleming M.D.   On: 02/28/2017 10:19   Mr Brain Wo Contrast  Result Date: 02/28/2017 CLINICAL DATA:  Altered mental status, TIA. LEFT-sided weakness, follow-up acute RIGHT MCA and PCA territory infarcts. EXAM: MRI HEAD WITHOUT CONTRAST TECHNIQUE: Axial and coronal diffusion weighted imaging. Examination was terminated, per technologist note, patient began kicking and screaming. COMPARISON:  CT/CTA HEAD and neck February 28, 2017 FINDINGS: Confluent RIGHT frontal lobe reduced diffusion extending to the insula with low ADC values. Patchy discontinuous RIGHT frontal lobe reduced diffusion with normalized ADC values. Confluent RIGHT parietooccipital and subcentimeter RIGHT posterior temporal lobe foci of reduced diffusion with low ADC values. Regional mass effect without midline shift. No hydrocephalus. No abnormal extra-axial fluid collections. IMPRESSION: 1. Limited 2 sequence diffusion-weighted imaging examination. 2. Acute large RIGHT frontal/MCA and RIGHT posterior watershed territory infarcts. 3. Subacute small RIGHT frontal lobe/MCA infarct. Electronically Signed   By: Awilda Metro M.D.   On: 02/28/2017 22:28   Ct Cerebral Perfusion W Contrast  Result Date: 02/28/2017 CLINICAL DATA:  52 year old female, code stroke. Left side paralysis, unknown time of onset. EXAM: CT ANGIOGRAPHY HEAD AND NECK CT PERFUSION BRAIN TECHNIQUE:  Multidetector CT imaging of the head and neck was performed using the standard protocol during bolus administration of intravenous contrast. Multiplanar CT image reconstructions and MIPs were obtained to evaluate the vascular anatomy. Carotid stenosis measurements (when applicable) are obtained utilizing NASCET criteria, using the distal internal carotid diameter as the denominator. Multiphase CT imaging of the brain was performed following IV bolus contrast injection. Subsequent parametric perfusion maps were calculated using RAPID software. CONTRAST:  90mL ISOVUE-370 IOPAMIDOL (ISOVUE-370) INJECTION 76% COMPARISON:  Head CT without contrast 0935 hr today. FINDINGS: CT Brain Perfusion Findings: CBF (<30%) Volume: 7 mL, but underestimated due to hypodense areas of core infarct. Perfusion (Tmax>6.0s) volume: 0 milliliters Mismatch Volume: Not applicable Infarction Location:Right hemisphere. CTA NECK Skeleton: No acute osseous abnormality identified. Upper chest: Mild gas trapping in the upper lobes. No superior mediastinal lymphadenopathy. Other neck: Negative.  No cervical lymphadenopathy. Aortic arch: 3 vessel arch configuration. Mild motion artifact. No arch atherosclerosis or great vessel origin stenosis suspected. Right carotid system: Mild motion artifact at the right CCA origin without stenosis. No atherosclerosis identified in the cervical right carotid. Widely patent right ICA origin and bulb. Left carotid system: Mild motion artifact at the left CCA origin. Minimal if any atherosclerosis in the left cervical carotid. Widely patent left ICA origin and bulb. Vertebral arteries: No proximal right subclavian artery or right vertebral artery origin plaque or stenosis. Patent right vertebral artery to the skull base without stenosis. Mild motion artifact at the proximal subclavian artery. Normal left vertebral artery origin. Tortuous left V1 segment. Fairly codominant vertebral arteries in the neck. No plaque or  stenosis in the left vertebral artery to the skull base. CTA HEAD Posterior circulation: Codominant distal vertebral arteries are  patent to the vertebrobasilar junction without stenosis. Patent PICA origins. Patent basilar artery without stenosis. Normal SCA and PCA origins. Posterior communicating arteries are diminutive or absent. Bilateral PCA branches are symmetric and within normal limits. Anterior circulation: The left ICA siphon is patent with mild to moderate calcified plaque in the cavernous and supraclinoid ICA segments. Possible mild left supraclinoid segment stenosis. Patent left ICA terminus. Calcified plaque plus soft plaque or thrombus in the right ICA siphon. Short segment (3 millimeter) occlusion of the distal cavernous right ICA (series 11, image 91) with reconstituted flow at the right anterior genu and patent right supraclinoid segment. Superimposed mild to moderate right siphon atherosclerotic stenosis. Patent right ICA terminus. Normal MCA origins. Questionable moderate stenosis of the right ACA origin. Bilateral ACA branches are within normal limits. Left MCA M1 segment, bifurcation, and left MCA branches are within normal limits. Right MCA, M1 segment bifurcation, and right MCA branches are within normal limits. No right MCA branch occlusion identified. Venous sinuses: Arterial dominant phase imaging. Grossly patent major dural venous sinuses. Anatomic variants: None. Review of the MIP images confirms the above findings IMPRESSION: 1. 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 Patent right ICA terminus and right MCA. 2. Right MCA core infarct volume underestimated by CT Perfusion, suggesting subacute ischemic timing. 3. Moderate bilateral ICA siphon atherosclerosis associated with #1. But no carotid atherosclerosis or stenosis in the neck. And no posterior circulation atherosclerosis  or stenosis. 4. Study was reviewed in person with Dr. Milon Dikes on 02/28/2017 beginning at 1000 hrs. Electronically Signed   By: Odessa Fleming M.D.   On: 02/28/2017 10:19   Dg Chest Port 1 View  Result Date: 02/28/2017 CLINICAL DATA:  Headache, fever, shortness of breath. Mid chest pain. EXAM: PORTABLE CHEST 1 VIEW COMPARISON:  None FINDINGS: Cardiomegaly. Lungs are clear. No effusions. No acute bony abnormality. IMPRESSION: Cardiomegaly.  No active disease. Electronically Signed   By: Charlett Nose M.D.   On: 02/28/2017 10:37   Ct Head Code Stroke Wo Contrast  Result Date: 02/28/2017 CLINICAL DATA:  Code stroke. 52 year old female with left side paralysis. Unknown time of onset. EXAM: CT HEAD WITHOUT CONTRAST TECHNIQUE: Contiguous axial images were obtained from the base of the skull through the vertex without intravenous contrast. COMPARISON:  None. FINDINGS: Brain: Multifocal confluent areas of gray and white matter hypodensity are scattered in the right hemisphere, with wedge-shaped peripheral areas of involvement in the right middle frontal gyrus, right parietal lobe, and superior right occipital lobe. Additional smaller areas of involvement including the superior right peri-Rolandic cortex (series 3, image 25). No acute intracranial hemorrhage identified. No associated intracranial mass effect. Gray-white matter differentiation in the right temporal lobe and right basal ganglia is preserved. Left hemisphere, thalamic, and posterior fossa gray-white matter differentiation appears normal. No ventriculomegaly.  Normal basilar cisterns. Vascular: Calcified atherosclerosis at the skull base. No suspicious intracranial vascular hyperdensity. Skull: Negative. Sinuses/Orbits: Clear. Other: Visualized orbits and scalp soft tissues are within normal limits. ASPECTS [facility] Stroke Program Early CT Score) - Ganglionic level infarction (caudate, lentiform nuclei, internal capsule, insula, M1-M3 cortex): 6 (abnormal M1  segment) - Supraganglionic infarction (M4-M6 cortex): 0 (M4 through and 6 abnormal). Total score (0-10 with 10 being normal): 6 IMPRESSION: 1. Multifocal subacute appearing cortical infarcts in the right hemisphere, primarily the right MCA territory although there is also superior right occipital lobe involvement. The  right basal ganglia are spared. 2. No associated hemorrhage or intracranial mass effect. 3. ASPECTS is 6. 4. These results were communicated to Dr. Wilford Corner at 9:46 amon 1/23/2019by text page via the [facility] messaging system. Electronically Signed   By: Odessa Fleming M.D.   On: 02/28/2017 09:47   Ellwood Dense, DO 03/02/2017, 9:07 AM PGY-1, [facility] FPTS Intern pager: [phone], text pages welcome

## 2017-03-02 NOTE — Discharge Summary (Signed)
Family Medicine [facility] Discharge Summary  Patient name: Kristen Short Medical record number: [id number] Date of birth: 13-Mar-1965 Age: 52 y.o. 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 KVQ:QVZDGLOVSK:Strength 0/5 LUE including grip, 3/5 LLE, 5/5 RUE including grip, 5/5 RLE. No edema.  Neuro:Alert and oriented, nonslurred speech.  Brief Hospital Course:  Kristen Ayunis a 52 y.o.[name] [place] no previous documented PMH who presented with L-sided weakness and found to have acute large R frontal/MCA and R posterior watershed territory infarcts and subacute small RIGHT frontal lobe/MCA infarct evidenced on brain MRI. CTA head consistent with MRI findings. Upon arrival to the ED, tPA was not administered as patient arrived outside treatment window, with last known normal ~4-5 days prior. Neuro exam significant for 0/5 strength in LUE, 2/5 strength LLE, facial droop, dysphagia, and decreased sensation of L side. Also, there is concern for L homonymous hemianopsia given patient's inability to track on the L side and inattentiveness to the L side. Risk stratification labs revealed elevated LDL to 173 and A1c to 7.5. ECHO with EF 55-60% but no source of cardiac emboli seen. TEE with PFO although no cardiac emboli visualized. Permissive hypertension was allowed however Bps remained normotensive throughout  hospitalization without intervention. Patient had loop recorder placed 1/28 prior to discharge and will follow up with Neurology in 6 weeks.  Patient noted to have new onset diabetes on admission with A1c to 7.5. She was given sliding scale insulin and required an average 5 units over a 24 hour period. Therefore, will likely not require insulin use outpatient. Would, however consider starting Metformin.  Issues for Follow Up:  1. Patient recently immigrated from [place] in June 2018 to [place] area. [place] Will need PCP, please help arrange on discharge. 2. Medication Changes: 1. Started atorvastatin 80mg  daily 2. Started aspirin 325mg  daily 3. Consider starting Metformin for new onset diabetes 3. Patient will f/u with Neurology in 6 weeks.  Significant Procedures: TEE, Loop recorder placement  Significant Labs and Imaging:  Recent Labs  Lab 02/28/17 0919 02/28/17 0941  WBC 9.5  --   HGB 16.1* 17.7*  HCT 49.9* 52.0*  PLT 221  --    Recent Labs  Lab 02/28/17 0919 02/28/17 0941 03/01/17 1334 03/02/17 0529 03/03/17 0350 03/04/17 0529  NA 139 139 136 141 139 139  K 4.4 5.2* 4.0 4.1 4.2 4.2  CL 102 104 103 104 104 106  CO2 24  --  22 23 23 22   GLUCOSE 193* 194* 191* 128* 131* 149*  BUN 10 15 20  22* 22* 27*  CREATININE 1.02* 0.90 1.07* 1.06* 1.16* 1.13*  CALCIUM 9.6  --  9.5 9.7 10.1 9.5  ALKPHOS 76  --   --   --   --   --   AST 21  --   --   --   --   --   ALT 16  --   --   --   --   --  ALBUMIN 3.8  --   --   --   --   --    1/28 TEE Normal LV EF 65% PFO small with positive bubble Mild MR Normal RV Normal AV No aortic debris No effusion No LAA thrombus  1/23 CTA Head/Neck IMPRESSION: 1. 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 Patent right ICA terminus and right MCA. 2. Right MCA core infarct volume underestimated by CT  Perfusion, suggesting subacute ischemic timing. 3. Moderate bilateral ICA siphon atherosclerosis associated with #1. But no carotid atherosclerosis or stenosis in the neck. And no posterior circulation atherosclerosis or stenosis.  1/23 MRI Brain FINDINGS: Confluent RIGHT frontal lobe reduced diffusion extending to the insula with low ADC values. Patchy discontinuous RIGHT frontal lobe reduced diffusion with normalized ADC values. Confluent RIGHT parietooccipital and subcentimeter RIGHT posterior temporal lobe foci of reduced diffusion with low ADC values. Regional mass effect without midline shift. No hydrocephalus. No abnormal extra-axial fluid collections. IMPRESSION: 1. Limited 2 sequence diffusion-weighted imaging examination. 2. Acute large RIGHT frontal/MCA and RIGHT posterior watershed territory infarcts. 3. Subacute small RIGHT frontal lobe/MCA infarct.  1/23 CXR FINDINGS: Cardiomegaly. Lungs are clear. No effusions. No acute bony abnormality. IMPRESSION: Cardiomegaly.  No active disease.  1/24 TTE ECHO Study Conclusions - Left ventricle: The cavity size was normal. Systolic function was normal. The estimated ejection fraction was in the range of 55% to 60%. Although no diagnostic regional wall motion abnormality was identified, this possibility cannot be completely excluded on the basis of this study. Impressions: - No cardiac source of embolism was identified, but cannot be ruled out on the basis of this examination. Recommendations: Consider transesophageal echocardiography if clinically indicated.  Results/Tests Pending at Time of Discharge: None  Discharge Medications:  Allergies as of 03/05/2017   No Known Allergies     Medication List    TAKE these medications   acetaminophen 500 MG tablet Commonly known as:  TYLENOL Take 500 mg by mouth every 6 (six) hours as needed for mild pain.   aspirin 325 MG tablet Take 1 tablet (325 mg total) by mouth daily.    atorvastatin 80 MG tablet Commonly known as:  LIPITOR Take 1 tablet (80 mg total) by mouth daily at 6 PM.       Discharge Instructions: Please refer to Patient Instructions section of EMR for full details.  Patient was counseled important signs and symptoms that should prompt return to medical care, changes in medications, dietary instructions, activity restrictions, and follow up appointments.   Follow-Up Appointments:   Ellwood Dense, DO 03/05/2017, 3:38 PM PGY-1, [facility]

## 2017-03-02 NOTE — Progress Notes (Signed)
Physical Therapy Treatment Patient Details Name: Kristen Short MRN: [id number] DOB: 1965-04-30 Today's Date: 03/02/2017    History of Present Illness 52yo female presenting with L sided weakness, no significant PMH. Imaging studies show acute CVA involving R MCA and PCA. No TPA was given. Significant L UE weakness (0/5) and L LE weakness (3/5), L facial droop, possible L HH noted. She is from [place], speaks [place] but no english.     PT Comments    Patient received in bed, pleasant and willing to participate with skilled PT services today. Began session with functional LE exercises for L LE, with patient able to perform exercises generally with min guard to MinA, but able to push PT away during hip extension press in bed. She continues to require assist +2 for functional bed mobility and for safety with transfers, noted ability to bear weight through L LE and she was able to perform small steps with R LE during stand pivot transfers. Patient able to follow basic cues in English, however video interpreter was utilized to provide further education today including education to husband to not assist patient with mobility/getting her out of bed. She was left in the chair with alarm set, all needs otherwise met this afternoon.    Follow Up Recommendations  CIR     Equipment Recommendations  None recommended by PT(defer to next venue )    Recommendations for Other Services       Precautions / Restrictions Precautions Precautions: Fall;Other (comment) Precaution Comments: No UE movement /moderate L LE weakness, L inattention, impulsive  Restrictions Weight Bearing Restrictions: No    Mobility  Bed Mobility Overal bed mobility: Needs Assistance Bed Mobility: Supine to Sit     Supine to sit: Mod assist;+2 for safety/equipment     General bed mobility comments: to bring LEs around, bring trunk up at edge of bed   Transfers Overall transfer level: Needs assistance Equipment used:  None Transfers: Sit to/from [facility] Sit to Stand: Mod assist;+2 safety/equipment Stand pivot transfers: Mod assist;+2 safety/equipment       General transfer comment: +2 for safety, patient continues to be impulsive; able to shift weight briefly onto L LE to take steps with R   Ambulation/Gait             General Gait Details: DNT today    Stairs            Wheelchair Mobility    Modified Rankin (Stroke Patients Only)       Balance Overall balance assessment: Needs assistance Sitting-balance support: Feet supported Sitting balance-Leahy Scale: Fair Sitting balance - Comments: forward lean today requiring min guard      Standing balance-Leahy Scale: Poor Standing balance comment: reliant on assistance to maintain balance during transfer                             Cognition Arousal/Alertness: Awake/alert Behavior During Therapy: Impulsive Overall Cognitive Status: Impaired/Different from baseline Area of Impairment: Safety/judgement;Problem solving;Attention                   Current Attention Level: Sustained     Safety/Judgement: Decreased awareness of safety;Decreased awareness of deficits   Problem Solving: Requires verbal cues;Requires tactile cues;Difficulty sequencing General Comments: impulsive, patient reveals she is able to speak some English- enough to follow commands-, all other education provided through video interpreter today       Exercises General Exercises -  Lower Extremity Heel Slides: Left;10 reps;Supine Hip ABduction/ADduction: Left;10 reps;Supine Straight Leg Raises: Left;10 reps;Supine Other Exercises Other Exercises: L LE leg presses 1x10 with resistance from PT     General Comments General comments (skin integrity, edema, etc.): husband requires further education on not getting paitent up/moving her by himself, safety education       Pertinent Vitals/Pain Pain Assessment:  Faces Faces Pain Scale: Hurts even more Pain Location: L UE, L LE, HA  Pain Descriptors / Indicators: Aching;Tingling;Numbness Pain Intervention(s): Limited activity within patient's tolerance;Monitored during session;Repositioned    Home Living                      Prior Function            PT Goals (current goals can now be found in the care plan section) Acute Rehab PT Goals Patient Stated Goal: to get better PT Goal Formulation: With patient Time For Goal Achievement: 03/15/17 Potential to Achieve Goals: Fair Progress towards PT goals: Progressing toward goals    Frequency    Min 5X/week      PT Plan Current plan remains appropriate    Co-evaluation              AM-PAC PT "6 Clicks" Daily Activity  Outcome Measure  Difficulty turning over in bed (including adjusting bedclothes, sheets and blankets)?: Unable Difficulty moving from lying on back to sitting on the side of the bed? : Unable Difficulty sitting down on and standing up from a chair with arms (e.g., wheelchair, bedside commode, etc,.)?: Unable Help needed moving to and from a bed to chair (including a wheelchair)?: A Lot Help needed walking in hospital room?: Total Help needed climbing 3-5 steps with a railing? : Total 6 Click Score: 7    End of Session Equipment Utilized During Treatment: Gait belt Activity Tolerance: Patient tolerated treatment well Patient left: in chair;with chair alarm set;with call bell/phone within reach;with family/visitor present   PT Visit Diagnosis: Unsteadiness on feet (R26.81);Muscle weakness (generalized) (M62.81);Other abnormalities of gait and mobility (R26.89);Hemiplegia and hemiparesis;Difficulty in walking, not elsewhere classified (R26.2) Hemiplegia - Right/Left: Left Hemiplegia - dominant/non-dominant: Non-dominant Hemiplegia - caused by: Cerebral infarction     Time: 1127-1150 PT Time Calculation (min) (ACUTE ONLY): 23 min  Charges:   $Therapeutic Activity: 8-22 mins $Self Care/Home Management: 8-22                    G Codes:       Deniece Ree PT, DPT, CBIS  Supplemental Physical Therapist [place]   Pager [phone]

## 2017-03-02 NOTE — Progress Notes (Signed)
NEUROHOSPITALISTS STROKE TEAM - DAILY PROGRESS NOTE   ADMISSION HISTORY: Kristen Short is a 52 y.o. female with no known PMH, recent immigrant from [place] who lives in [place] and the rest of the family lives in [place], was brought in for evaluation of left-sided weakness.  She had been complaining of a headache that started around Saturday.  She describes it as a very severe pressure-like headache all over her head. Over the next day or so, she started having some difficulty walking.  Yesterday it was noted that around 3 PM she could not walk and had severe weakness in her leg. For the symptoms, she was driven by family to [facility] and brought to [facility] for further evaluation.  Upon arrival here in the ER, she had left facial weakness, left arm facility, left leg weakness and right gaze preference.  A code stroke was called because initial information was that her last known well was 3 PM yesterday whereas on more detailed history taking, it seems like the symptoms have been ongoing at least since Sunday. No recent illnesses.  No fever chills.  No chest pain palpitations.  No shortness of breath.  No nausea vomiting abdominal pain diarrhea constipation.  LKW: 1500 hrs, 02/28/2017 is what was told at first but seems like it was sometime on Sunday, 02/25/2017 tpa given?: no, OSW Premorbid modified Rankin scale (mRS):0 NIHSS - 15  SUBJECTIVE (INTERVAL HISTORY) Family is at the bedside. Patient is found laying in bed in NAD. Overall she feels her condition is stable. She has a mild headache on the right side of the head. . No new events reported overnight. Plan for TEE on Monday and hopefully CIR early next week   OBJECTIVE Lab Results: CBC:  Recent Labs  Lab 02/28/17 0919 02/28/17 0941  WBC 9.5  --   HGB 16.1* 17.7*  HCT 49.9* 52.0*  MCV 77.4*  --   PLT 221  --    BMP: Recent Labs  Lab 02/28/17 0919 02/28/17 0941  03/01/17 1334 03/02/17 0529  NA 139 139 136 141  K 4.4 5.2* 4.0 4.1  CL 102 104 103 104  CO2 24  --  22 23  GLUCOSE 193* 194* 191* 128*  BUN 10 15 20  22*  CREATININE 1.02* 0.90 1.07* 1.06*  CALCIUM 9.6  --  9.5 9.7   Liver Function Tests:  Recent Labs  Lab 02/28/17 0919  AST 21  ALT 16  ALKPHOS 76  BILITOT 0.6  PROT 8.0  ALBUMIN 3.8   Coagulation Studies:  Recent Labs    02/28/17 0919  APTT 33  INR 0.96   Urinalysis:  Recent Labs  Lab 02/28/17 1150  COLORURINE YELLOW  APPEARANCEUR CLEAR  LABSPEC >1.046*  PHURINE 7.0  GLUCOSEU NEGATIVE  HGBUR MODERATE*  BILIRUBINUR NEGATIVE  KETONESUR 5*  PROTEINUR 100*  NITRITE NEGATIVE  LEUKOCYTESUR NEGATIVE   Urine Drug Screen:     Component Value Date/Time   LABOPIA NONE DETECTED 02/28/2017 1150   COCAINSCRNUR NONE DETECTED 02/28/2017 1150   LABBENZ NONE DETECTED 02/28/2017 1150   AMPHETMU NONE DETECTED 02/28/2017 1150   THCU NONE DETECTED 02/28/2017 1150   LABBARB NONE DETECTED 02/28/2017 1150    Alcohol Level:  Recent Labs  Lab 02/28/17 0919  ETH <10   PHYSICAL EXAM Temp:  [98.2 F (36.8 C)-99.6 F (37.6 C)] 98.2 F (36.8 C) (01/25 0505) Pulse Rate:  [78-92] 89 (01/25 0505) Resp:  [16-18] 18 (01/25 0505) BP: (127-157)/(76-90) 127/78 (01/25 0505)  SpO2:  [99 %] 99 % (01/25 0505) General - Well nourished, well developed, in no apparent distress HEENT-  Normocephalic,  Cardiovascular - Regular rate and rhythm  Respiratory - Lungs clear bilaterally. No wheezing. Abdomen - soft and non-tender, BS normal Extremities- no edema or cyanosis NEURO:  Mental Status: AA&Ox3  Language: speech is dysarthric.  Naming, repetition, fluency, and comprehension intact. Cranial nerves: Pupils equal round reactive to light, gaze preference to the right, able to get to midline but unable to cross midline to the left, left homonymous hemianopsia, left lower facial weakness at rest and on smiling, decreased sensation on the  left face, tongue midline. Motor exam: 0/5 left upper extremity, 3/5 left lower extremity with vertical drift, 5/5 right upper and lower extremities. Sensory: Diminished sensation on the left hemibody to all modalities.  Extinction on double simultaneous stimulation. Cerebellar function: Intact finger-nose-finger on the right, intact heel-knee-shin on the right, unable to perform on the left. Gait testing was deferred.  IMAGING: I have personally reviewed the radiological images below and agree with the radiology interpretations.  Ct Angio Head /Neck and Perfusion W Or Wo Contrast Result Date: 02/28/2017 IMPRESSION: 1. 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 Patent right ICA terminus and right MCA. 2. Right MCA core infarct volume underestimated by CT Perfusion, suggesting subacute ischemic timing. 3. Moderate bilateral ICA siphon atherosclerosis associated with #1. But no carotid atherosclerosis or stenosis in the neck. And no posterior circulation atherosclerosis or stenosis. 4. Study was reviewed in person with Dr. Milon Dikes on 02/28/2017 beginning at 1000 hrs. Electronically Signed   By: Odessa Fleming M.D.   On: 02/28/2017 10:19   Mr Brain Wo Contrast Result Date: 02/28/2017  IMPRESSION: 1. Limited 2 sequence diffusion-weighted imaging examination. 2. Acute large RIGHT frontal/MCA and RIGHT posterior watershed territory infarcts. 3. Subacute small RIGHT frontal lobe/MCA infarct. Electronically Signed   By: Awilda Metro M.D.   On: 02/28/2017 22:28   Dg Chest Port 1 View Result Date: 02/28/2017  IMPRESSION: Cardiomegaly.  No active disease. Electronically Signed   By: Charlett Nose M.D.   On: 02/28/2017 10:37   Ct Head Code Stroke Wo Contrast Result Date: 02/28/2017 IMPRESSION: 1. Multifocal subacute appearing cortical infarcts in the right hemisphere, primarily the right MCA  territory although there is also superior right occipital lobe involvement. The right basal ganglia are spared. 2. No associated hemorrhage or intracranial mass effect. 3. ASPECTS is 6. 4. These results were communicated to Dr. Wilford Corner at 9:46 amon 1/23/2019by text page via the [facility] messaging system. Electronically Signed   By: Odessa Fleming M.D.   On: 02/28/2017 09:47   Echocardiogram:                                               Study Conclusions - Left ventricle: The cavity size was normal. Systolic function was   normal. The estimated ejection fraction was in the range of 55%   to 60%. Although no diagnostic regional wall motion abnormality   was identified, this possibility cannot be completely excluded on   the basis of this study. Impressions:- No cardiac source of embolism was identified, but cannot be ruled   out on the basis of this examination.  TEE /Loop Recorder:  PENDING for Monday     IMPRESSION: Ms. Natalye Kott is a 52 y.o. female with no known past medical history brought in for evaluation of left-sided weakness that started Saturday or Sunday. Symptoms consistent with a right MCA territory stroke. NIH 15 on arrival.  Not a candidate for TPA as she is out of the window.  She does have a short segment occlusion of the right cavernous ICA but is not an endovascular treatment candidate because of her last known well being 2-3 days ago.  Acute large RIGHT frontal/MCA and RIGHT posterior watershed territory infarcts Subacute small RIGHT frontal lobe/MCA infarct RIGHT ICA occlusion  Suspected Etiology: atheroembolic vs cardioembolic source  Resultant Symptoms: Left sided deficits Stroke Risk Factors: diabetes mellitus and hyperlipidemia Other Stroke Risk Factors: None known  Outstanding Stroke Work-up Studies:     TEE /Loop Recorder:                                             PENDING Please call if any acute findings  PLAN   03/02/2017: Continue Aspirin/ Statin Frequent neuro checks Telemetry monitoring PT/OT/SLP Consult PM & Rehab Consult Case Management /MSW -  Needs PCP TEE and Loop Recorder Placement- Cardiology aware, scheduled for Monday Ongoing aggressive stroke risk factor management Patient's family will be counseled to be compliant with her antithrombotic medications Patient's family will be counseled on Lifestyle modifications incl, Diet, Exercise, and Stress Follow up with [facility] in 6 weeks  INTRACRANIAL Atherosclerosis &Stenosis: Moderate B/L ICA siphon atherosclerosis associated w/ occlusion of Right ICA  May consider DAPT prior to discharge  DYSPHAGIA: Passed SLP swallow evaluation - Dysphagia 3 Aspiration Precautions in progress  R/O AFIB: TEE and Loop Recorder Placement - scheduled for Monday, Please make NPO on Sunday evening for procedure  HYPERTENSION: Stable Permissive hypertension (OK if <220/120) for 24-48 hours post stroke and then gradually normalized within 5-7 days. Long term BP goal normotensive. May slowly start B/P medications after 48 hours, if necessary Home Meds: NONE  HYPERLIPIDEMIA: New Diagnosis    Component Value Date/Time   CHOL 236 (H) 03/01/2017 0327   TRIG 135 03/01/2017 0327   HDL 36 (L) 03/01/2017 0327   CHOLHDL 6.6 03/01/2017 0327   VLDL 27 03/01/2017 0327   LDLCALC 173 (H) 03/01/2017 0327  Home Meds:  NONE LDL  goal < 70 Started on  Lipitor to 80 mg daily Continue statin at discharge  DIABETES: New Diagnosis Lab Results  Component Value Date   HGBA1C 7.5 (H) 03/01/2017  HgbA1c goal < 7.0 Currently on: No meds, will need Novolog Continue CBG monitoring and SSI to maintain glucose 140-180 mg/dl DM education   Other Active Problems: Active Problems:   CVA (cerebral vascular accident) [facility])    Hospital day # 2 VTE prophylaxis: Lovenox  Diet : Fall precautions DIET DYS 3 Room service appropriate? Yes; Fluid  consistency: Thin   FAMILY UPDATES:  family at bedside  TEAM UPDATES: Nestor Ramp, MD   Prior Home Stroke Medications:  No antithrombotic  Discharge Stroke Meds:  Please discharge patient on aspirin 325 mg daily for now  Disposition: Final discharge disposition not confirmed Therapy Recs:               PENDING Follow Up:  Patient, No Pcp Per -PCP Follow up in 1-2 weeks   Case Management aware  of need    Brita RompMary A Edwena Mayorga, ANP-C Stroke Neurology Team 03/02/2017 12:54 PM  Neurology to sign off at this time. Please call for any further questions/concerns or acute findings with TEE. Thank you for this consultation  To contact Stroke Continuity provider, please refer to [email or web address]. After hours, contact General Neurology

## 2017-03-03 DIAGNOSIS — I639 Cerebral infarction, unspecified: Secondary | ICD-10-CM

## 2017-03-03 LAB — BASIC METABOLIC PANEL
ANION GAP: 12 (ref 5–15)
BUN: 22 mg/dL — ABNORMAL HIGH (ref 6–20)
CALCIUM: 10.1 mg/dL (ref 8.9–10.3)
CO2: 23 mmol/L (ref 22–32)
CREATININE: 1.16 mg/dL — AB (ref 0.44–1.00)
Chloride: 104 mmol/L (ref 101–111)
GFR, EST NON AFRICAN AMERICAN: 53 mL/min — AB (ref >60)
Glucose, Bld: 131 mg/dL — ABNORMAL HIGH (ref 65–99)
Potassium: 4.2 mmol/L (ref 3.5–5.1)
SODIUM: 139 mmol/L (ref 135–145)

## 2017-03-03 LAB — GLUCOSE, CAPILLARY
GLUCOSE-CAPILLARY: 135 mg/dL — AB (ref 65–99)
Glucose-Capillary: 184 mg/dL — ABNORMAL HIGH (ref 65–99)
Glucose-Capillary: 142 mg/dL — ABNORMAL HIGH (ref 65–99)
Glucose-Capillary: 126 mg/dL — ABNORMAL HIGH (ref 65–99)

## 2017-03-03 MED ORDER — DIPHENHYDRAMINE HCL 25 MG PO CAPS
25.0000 mg | ORAL_CAPSULE | Freq: Every evening | ORAL | Status: DC | PRN
  Administered 2017-03-03 – 2017-03-04 (×2): 25 mg via ORAL
  Filled 2017-03-03 (×2): qty 1

## 2017-03-03 NOTE — Progress Notes (Signed)
Family Medicine Teaching Service Daily Progress Note Intern Pager: [phone]  Patient name: Kristen Short Medical record number: [id number] Date of birth: August 02, 1965 Age: 52 y.o. Gender: female  Primary Care Provider: Patient, No Pcp Per Consultants: Neuro Code Status: Full  Pt Overview and Major Events to Date:  1/23 - admitted for stroke, L sided weakness/inattentiveness  Assessment and Plan: Kristen Short is a 52 y.o. female presenting with L-sided weakness. No significant PMH.   L-sided weakness with R MCA stroke: CTA head/neck with acute ischemic stroke involving R MCA and PCA territory.  Did not receive tPA as out of treatment window on presentation. Continues to have persistent LUE weakness (1/5 strength), with accompanying LLE weakness (3/5) and L-sided facial droop.  MRI with acute large R frontal/MCA and R posterior watershed territory infarcts and subacute small RIGHT frontal lobe/MCA infarct. ECHO EF 55-60%, no cardiac source of emboli. Awaiting TEE and loop recorder placement Monday followed by plan for CIR.  VSS overnight; pt doing well, reported some mild itching and given Benadryl which helped.      - Neuro following, appreciate recs - Continue on telemetry - TEE  - Neuro checks q2  - Continue aspirin 325mg , atorvastatin 80mg  daily - PT/OT - recommend CIR - CM consult- will need to establish with PCP likely in CLT area upon discharge  New-Onset Type 2 Diabetes A1c on admission 7.5, no previous PMH or medical care so unclear how long CBGs elevated. CBGs 130s overnight.  - sSSI AC & qHS  FEN/GI: DYS 3 Prophylaxis: Lovenox  Disposition: Awaiting TEE and loop recorder placement then plan for CIR  Subjective:  Patient denies double vision.  Husband at bedside.    Endorsing chronic itching overnight, received Benadaryl which seems to have helped.  Feels very tired this morning.   Objective: Temp:  [98.2 F (36.8 C)-99.3 F (37.4 C)] 98.5 F (36.9 C) (01/26 0948) Pulse  Rate:  [79-90] 90 (01/26 0948) Resp:  [18] 18 (01/26 0948) BP: (116-130)/(79-101) 130/83 (01/26 0948) SpO2:  [98 %-100 %] 98 % (01/26 0948)   Physical Exam: General: female lying in bed with assistance from husband, in NAD Cardiovascular: RRR, no MRG  Respiratory: CTAB, normal effort  Abdomen: soft, NTND, +BS Extremities: warm and well perfused. MSK: Strength 1/5 LUE including grip, 2/5 LLE, 5/5 RUE including grip, 5/5 RLE. No edema.  Neuro: Alert and oriented, nonslurred speech. Sensation intact symmetrically to face and extremities bilaterally. Tongue protrudes normally with no deviation.  Shoulder shrug decreased on the L. L sided facial droop.  Laboratory: Recent Labs  Lab 02/28/17 0919 02/28/17 0941  WBC 9.5  --   HGB 16.1* 17.7*  HCT 49.9* 52.0*  PLT 221  --    Recent Labs  Lab 02/28/17 0919  03/01/17 1334 03/02/17 0529 03/03/17 0350  NA 139   < > 136 141 139  K 4.4   < > 4.0 4.1 4.2  CL 102   < > 103 104 104  CO2 24  --  22 23 23   BUN 10   < > 20 22* 22*  CREATININE 1.02*   < > 1.07* 1.06* 1.16*  CALCIUM 9.6  --  9.5 9.7 10.1  PROT 8.0  --   --   --   --   BILITOT 0.6  --   --   --   --   ALKPHOS 76  --   --   --   --   ALT 16  --   --   --   --  AST 21  --   --   --   --   GLUCOSE 193*   < > 191* 128* 131*   < > = values in this interval not displayed.   Imaging/Diagnostic Tests: No results found.    Freddrick March, MD 03/03/2017, 10:18 AM PGY-2, [facility]

## 2017-03-03 NOTE — Plan of Care (Signed)
  Clinical Measurements: Ability to maintain clinical measurements within normal limits will improve 03/03/2017 0236 - Progressing by Ashley RoyaltyEdoh, Clessie Karras I, RN   Health Behavior/Discharge Planning: Ability to manage health-related needs will improve 03/03/2017 0236 - Progressing by Ashley RoyaltyEdoh, Hart Haas I, RN   Clinical Measurements: Diagnostic test results will improve 03/03/2017 0236 - Progressing by Azzie RoupEdoh, Sanaiya Welliver I, RN   Activity: Risk for activity intolerance will decrease 03/03/2017 0236 - Progressing by Ashley RoyaltyEdoh, Vallery Mcdade I, RN

## 2017-03-04 DIAGNOSIS — E785 Hyperlipidemia, unspecified: Secondary | ICD-10-CM

## 2017-03-04 DIAGNOSIS — E119 Type 2 diabetes mellitus without complications: Secondary | ICD-10-CM

## 2017-03-04 LAB — BASIC METABOLIC PANEL
ANION GAP: 11 (ref 5–15)
BUN: 27 mg/dL — ABNORMAL HIGH (ref 6–20)
CALCIUM: 9.5 mg/dL (ref 8.9–10.3)
CO2: 22 mmol/L (ref 22–32)
Chloride: 106 mmol/L (ref 101–111)
Creatinine, Ser: 1.13 mg/dL — ABNORMAL HIGH (ref 0.44–1.00)
GFR calc Af Amer: >60 mL/min (ref >60)
GFR, EST NON AFRICAN AMERICAN: 55 mL/min — AB (ref >60)
GLUCOSE: 149 mg/dL — AB (ref 65–99)
Potassium: 4.2 mmol/L (ref 3.5–5.1)
Sodium: 139 mmol/L (ref 135–145)

## 2017-03-04 LAB — GLUCOSE, CAPILLARY
GLUCOSE-CAPILLARY: 237 mg/dL — AB (ref 65–99)
GLUCOSE-CAPILLARY: 151 mg/dL — AB (ref 65–99)
Glucose-Capillary: 157 mg/dL — ABNORMAL HIGH (ref 65–99)
Glucose-Capillary: 138 mg/dL — ABNORMAL HIGH (ref 65–99)

## 2017-03-04 MED ORDER — DOCUSATE SODIUM 100 MG PO CAPS
100.0000 mg | ORAL_CAPSULE | Freq: Two times a day (BID) | ORAL | Status: DC
  Administered 2017-03-04: 100 mg via ORAL
  Filled 2017-03-04: qty 1

## 2017-03-04 MED ORDER — CAMPHOR-MENTHOL 0.5-0.5 % EX LOTN
TOPICAL_LOTION | Freq: Three times a day (TID) | CUTANEOUS | Status: DC
  Administered 2017-03-04 (×2): via TOPICAL
  Administered 2017-03-04: 1 via TOPICAL
  Administered 2017-03-05: 18:00:00 via TOPICAL
  Filled 2017-03-04: qty 222

## 2017-03-04 MED ORDER — DIPHENHYDRAMINE HCL 25 MG PO CAPS
25.0000 mg | ORAL_CAPSULE | Freq: Three times a day (TID) | ORAL | Status: DC | PRN
  Administered 2017-03-04 – 2017-03-05 (×2): 25 mg via ORAL
  Filled 2017-03-04 (×2): qty 1

## 2017-03-04 MED ORDER — POLYETHYLENE GLYCOL 3350 17 G PO PACK
17.0000 g | PACK | Freq: Two times a day (BID) | ORAL | Status: DC
  Administered 2017-03-04: 17 g via ORAL
  Filled 2017-03-04: qty 1

## 2017-03-04 NOTE — H&P (View-Only) (Signed)
Family Medicine Teaching Service Daily Progress Note Intern Pager: [phone]  Patient name: Kristen Short Medical record number: [id number] Date of birth: 1965/12/09 Age: [phone] y.o. Gender: female  Primary Care Provider: Patient, No Pcp Short Consultants: Neuro, PT/OT, CM Code Status: Full  Pt Overview and Major Events to Date:  1/23 - admitted for stroke, L sided weakness/inattentiveness  Assessment and Plan: Kristen Short is a 52 y.o. female presenting with L-sided weakness. No significant PMH.   L-sided weakness with R MCA stroke: CTA head/neck with acute ischemic stroke involving R MCA and PCA territory.  Continues to have persistent LUE weakness (1/5 strength), with accompanying LLE weakness (3/5) and L-sided facial droop.   -Awaiting TEE and loop recorder placement 1/28  -Neuro following, appreciate recs -Continue on telemetry  -Continue aspirin 325mg , atorvastatin 80mg  daily -CM consult- will need to establish with PCP likely in CLT area upon discharge  New-Onset Type 2 Diabetes A1c on admission 7.5, no previous PMH or medical care so unclear how long CBGs elevated. CBC 157 this am. -sSSI AC & qHS  FEN/GI: DYS 3 Prophylaxis: Lovenox  Disposition: Awaiting TEE and loop recorder placement then plan for CIR  Subjective:  Patient Short/o itching. She cannot move her left upper extremity. She is otherwise without complaints today.  Objective: Temp:  [98 F (36.7 Short)-99.6 F (37.6 Short)] 98 F (36.7 Short) (01/27 0528) Pulse Rate:  [83-98] 83 (01/27 0528) Resp:  [18] 18 (01/27 0528) BP: (117-133)/(71-89) 117/71 (01/27 0528) SpO2:  [97 %-100 %] 100 % (01/27 0528)   Physical Exam: General: female lying in bed in NAD Cardiovascular: RRR, no MRG  Respiratory: CTAB, normal effort  Abdomen: soft, NTND, +BS Extremities: warm and well perfused. MSK: Strength 0/5 LUE including grip, 3/5 LLE, 5/5 RUE including grip, 5/5 RLE. No edema.  Neuro: Alert and oriented, nonslurred  speech.  Laboratory: Recent Labs  Lab 02/28/17 0919 02/28/17 0941  WBC 9.5  --   HGB 16.1* 17.7*  HCT 49.9* 52.0*  PLT 221  --    Recent Labs  Lab 02/28/17 0919  03/02/17 0529 03/03/17 0350 03/04/17 0529  NA 139   < > 141 139 139  K 4.4   < > 4.1 4.2 4.2  CL 102   < > 104 104 106  CO2 24   < > 23 23 22   BUN 10   < > 22* 22* 27*  CREATININE 1.02*   < > 1.06* 1.16* 1.13*  CALCIUM 9.6   < > 9.7 10.1 9.5  PROT 8.0  --   --   --   --   BILITOT 0.6  --   --   --   --   ALKPHOS 76  --   --   --   --   ALT 16  --   --   --   --   AST 21  --   --   --   --   GLUCOSE 193*   < > 128* 131* 149*   < > = values in this interval not displayed.   Imaging/Diagnostic Tests: None in past 24 hours  Kristen Short, Kristen Wanninger C, DO 03/04/2017, 9:04 AM PGY-2, [facility]

## 2017-03-04 NOTE — Progress Notes (Signed)
Family Medicine Teaching Service Daily Progress Note Intern Pager: [phone]  Patient name: Kristen Short Medical record number: [id number] Date of birth: 07/06/1965 Age: 52 y.o. Gender: female  Primary Care Provider: Patient, No Pcp Per Consultants: Neuro, PT/OT, CM Code Status: Full  Pt Overview and Major Events to Date:  1/23 - admitted for stroke, L sided weakness/inattentiveness  Assessment and Plan: Kristen Short is a 52 y.o. female presenting with L-sided weakness. No significant PMH.   L-sided weakness with R MCA stroke: CTA head/neck with acute ischemic stroke involving R MCA and PCA territory.  Continues to have persistent LUE weakness (1/5 strength), with accompanying LLE weakness (3/5) and L-sided facial droop.   -Awaiting TEE and loop recorder placement 1/28  -Neuro following, appreciate recs -Continue on telemetry  -Continue aspirin 325mg, atorvastatin 80mg daily -CM consult- will need to establish with PCP likely in CLT area upon discharge  New-Onset Type 2 Diabetes A1c on admission 7.5, no previous PMH or medical care so unclear how long CBGs elevated. CBC 157 this am. -sSSI AC & qHS  FEN/GI: DYS 3 Prophylaxis: Lovenox  Disposition: Awaiting TEE and loop recorder placement then plan for CIR  Subjective:  Patient c/o itching. She cannot move her left upper extremity. She is otherwise without complaints today.  Objective: Temp:  [98 F (36.7 C)-99.6 F (37.6 C)] 98 F (36.7 C) (01/27 0528) Pulse Rate:  [83-98] 83 (01/27 0528) Resp:  [18] 18 (01/27 0528) BP: (117-133)/(71-89) 117/71 (01/27 0528) SpO2:  [97 %-100 %] 100 % (01/27 0528)   Physical Exam: General: female lying in bed in NAD Cardiovascular: RRR, no MRG  Respiratory: CTAB, normal effort  Abdomen: soft, NTND, +BS Extremities: warm and well perfused. MSK: Strength 0/5 LUE including grip, 3/5 LLE, 5/5 RUE including grip, 5/5 RLE. No edema.  Neuro: Alert and oriented, nonslurred  speech.  Laboratory: Recent Labs  Lab 02/28/17 0919 02/28/17 0941  WBC 9.5  --   HGB 16.1* 17.7*  HCT 49.9* 52.0*  PLT 221  --    Recent Labs  Lab 02/28/17 0919  03/02/17 0529 03/03/17 0350 03/04/17 0529  NA 139   < > 141 139 139  K 4.4   < > 4.1 4.2 4.2  CL 102   < > 104 104 106  CO2 24   < > 23 23 22  BUN 10   < > 22* 22* 27*  CREATININE 1.02*   < > 1.06* 1.16* 1.13*  CALCIUM 9.6   < > 9.7 10.1 9.5  PROT 8.0  --   --   --   --   BILITOT 0.6  --   --   --   --   ALKPHOS 76  --   --   --   --   ALT 16  --   --   --   --   AST 21  --   --   --   --   GLUCOSE 193*   < > 128* 131* 149*   < > = values in this interval not displayed.   Imaging/Diagnostic Tests: None in past 24 hours  Mariette Cowley C, DO 03/04/2017, 9:04 AM PGY-2, [facility]  

## 2017-03-04 NOTE — Progress Notes (Signed)
Occupational Therapy Treatment Patient Details Name: Kristen Short MRN: [id number] DOB: 06/15/1965 Today's Date: 03/04/2017    History of present illness 52yo female presenting with L sided weakness, no significant PMH. Imaging studies show acute CVA involving R MCA and PCA. No TPA was given. Significant L UE weakness (0/5) and L LE weakness (3/5), L facial droop, possible L HH noted. She is from [place], speaks [place] but no english.    OT comments  Pt making progress towards OT goals this session. Pt's LUE continues to have no movement and she describes the sensation as being touched through a wetsuit. Pt much more attentive to L side this session and without prompting was performing ROM with L hand and was able to do PROM with LUE. LUE remains tender at joints and education provided for Pt and husband in elevation, and ROM for edema management. Pt very motivated to work with therapy and not only performed 15 sit to stands this session (with very brief touch down) but stand pivot transfer. Pt will benefit from continued OT in the acute setting and CIR level therapy remains imperative to maximize safety and independence in ADL and transfers.   Pt mentioned prayer and God multiple times during session, recommending a visit from hospital chaplain.   Follow Up Recommendations  CIR;Supervision/Assistance - 24 hour    Equipment Recommendations  Other (comment)(defer to next venue)    Recommendations for Other Services      Precautions / Restrictions Precautions Precautions: Fall;Other (comment) Precaution Comments: No UE movement /moderate L LE weakness, L inattention, impulsive  Restrictions Weight Bearing Restrictions: No       Mobility Bed Mobility Overal bed mobility: Needs Assistance Bed Mobility: Supine to Sit     Supine to sit: Mod assist;HOB elevated     General bed mobility comments: to bring LEs around, bring trunk up at edge of bed  Transfers Overall transfer level:  Needs assistance Equipment used: 2 person hand held assist Transfers: Sit to/from Stand;Stand Pivot Transfers Sit to Stand: Mod assist;+2 safety/equipment Stand pivot transfers: Mod assist;+2 safety/equipment       General transfer comment: +2 for safety, patient continues to be impulsive; able to shift weight briefly onto L LE to take steps with R     Balance Overall balance assessment: Needs assistance Sitting-balance support: Feet supported Sitting balance-Leahy Scale: Fair Sitting balance - Comments: forward lean today requiring min guard; Pt able to long sit at supervision level in bed today   Standing balance support: During functional activity;Bilateral upper extremity supported Standing balance-Leahy Scale: Poor Standing balance comment: reliant on assistance to maintain balance during transfer                            ADL either performed or assessed with clinical judgement   ADL Overall ADL's : Needs assistance/impaired                 Upper Body Dressing : Maximal assistance;Sitting Upper Body Dressing Details (indicate cue type and reason): EOB without external support, cues for Pt to use RUE to assist LUE into hospital gown Lower Body Dressing: Maximal assistance;Sitting/lateral leans Lower Body Dressing Details (indicate cue type and reason): Pt able to don R sock without assist, OT initiated sock donning on L foot, Pt able to pull it on and up the rest of the way with RUE Toilet Transfer: +2 for safety/equipment;Moderate assistance;Stand-pivot;BSC Toilet Transfer Details (indicate cue type and reason):  transferred to the right (strong side)           General ADL Comments: Pt continues to be impulsive throughout session, eager to make progress and desire to get as much function back as possible comes back as impulsivity     Vision   Vision Assessment?: Yes Eye Alignment: Within Functional Limits Ocular Range of Motion: Within Functional  Limits Alignment/Gaze Preference: Gaze right Tracking/Visual Pursuits: Decreased smoothness of eye movement to LEFT superior field;Decreased smoothness of eye movement to LEFT inferior field;Impaired - to be further tested in functional context;Other (comment)(Improving as Pt is able to track (decreased smoothness)) Convergence: Impaired - to be further tested in functional context Diplopia Assessment: (Per Pt is has resolved) Additional Comments: Pt shared that double vision has resolved.    Perception     Praxis      Cognition Arousal/Alertness: Awake/alert Behavior During Therapy: Impulsive Overall Cognitive Status: Impaired/Different from baseline Area of Impairment: Safety/judgement;Problem solving;Attention                   Current Attention Level: Sustained     Safety/Judgement: Decreased awareness of safety;Decreased awareness of deficits   Problem Solving: Requires verbal cues;Requires tactile cues;Difficulty sequencing General Comments: impulsive, patient reveals she is able to speak some English- enough to follow commands-, all other education provided through video interpreter today         Exercises Exercises: Other exercises Other Exercises Other Exercises: PROM at digits, elbow, shoulder for LUE as pain tolerance would allow   Shoulder Instructions       General Comments Use of ipad interpreter throughout session. "Ellie" Pt asking about eating an orange, and educated on new diet for safety - offered orange juice. Pt tearful during session, and mentioned prayer and God many times. A visit from hospital chaplain might be useful/helpful for mental health. Pt's LUE is very stiff with edema, OT performed PROM at digits, elbow, shoulder as pain would allow. Pt touching and rubbing LUE and OT encouraged to contnue interaction with LUE    Pertinent Vitals/ Pain       Pain Assessment: Faces Faces Pain Scale: Hurts little more Pain Location: L UE, L LE, HA   Pain Descriptors / Indicators: Aching;Tingling;Numbness Pain Intervention(s): Monitored during session;Repositioned;Premedicated before session  Home Living                                          Prior Functioning/Environment              Frequency  Min 3X/week        Progress Toward Goals  OT Goals(current goals can now be found in the care plan section)  Progress towards OT goals: Progressing toward goals  Acute Rehab OT Goals Patient Stated Goal: to get better OT Goal Formulation: With patient/family Time For Goal Achievement: 03/15/17  Plan Discharge plan remains appropriate;Frequency remains appropriate    Co-evaluation                 AM-PAC PT "6 Clicks" Daily Activity     Outcome Measure   Help from another person eating meals?: A Little Help from another person taking care of personal grooming?: A Lot Help from another person toileting, which includes using toliet, bedpan, or urinal?: A Lot Help from another person bathing (including washing, rinsing, drying)?: A Lot Help from another person to put on  and taking off regular upper body clothing?: A Lot Help from another person to put on and taking off regular lower body clothing?: A Lot 6 Click Score: 13    End of Session Equipment Utilized During Treatment: Gait belt  OT Visit Diagnosis: Unsteadiness on feet (R26.81);Other abnormalities of gait and mobility (R26.89);Muscle weakness (generalized) (M62.81);Low vision, both eyes (H54.2);Other symptoms and signs involving cognitive function;Hemiplegia and hemiparesis;Pain Hemiplegia - Right/Left: Left Hemiplegia - dominant/non-dominant: Non-Dominant Hemiplegia - caused by: Cerebral infarction   Activity Tolerance Patient tolerated treatment well   Patient Left in chair;with call bell/phone within reach;with chair alarm set;with family/visitor present(instructions (by interpreter) to use staff fo get up)   Nurse Communication  Mobility status        Time: 1610-9604 OT Time Calculation (min): 32 min  Charges: OT General Charges $OT Visit: 1 Visit OT Treatments $Self Care/Home Management : 23-37 mins  Sherryl Manges OTR/L [phone]   Evern Bio Lauraine Crespo 03/04/2017, 2:57 PM

## 2017-03-05 ENCOUNTER — Encounter (HOSPITAL_COMMUNITY): Payer: Self-pay

## 2017-03-05 ENCOUNTER — Encounter (HOSPITAL_COMMUNITY)
Admission: EM | Disposition: A | Discharge status: Rehabilitation Facility | Payer: Self-pay | Source: Home / Self Care | Attending: Family Medicine

## 2017-03-05 ENCOUNTER — Encounter (HOSPITAL_COMMUNITY): Payer: Self-pay | Admitting: Nurse Practitioner

## 2017-03-05 ENCOUNTER — Inpatient Hospital Stay (HOSPITAL_COMMUNITY)
Admission: RE | Admit: 2017-03-05 | Discharge: 2017-03-17 | DRG: 057 | Disposition: A | Discharge status: Home Health | Payer: 59 | Source: Intra-hospital | Attending: Physical Medicine & Rehabilitation | Admitting: Physical Medicine & Rehabilitation

## 2017-03-05 ENCOUNTER — Inpatient Hospital Stay (HOSPITAL_COMMUNITY)
Admit: 2017-03-05 | Discharge: 2017-03-05 | Disposition: A | Discharge status: Home / Self Care | Payer: 59 | Attending: Nurse Practitioner | Admitting: Nurse Practitioner

## 2017-03-05 DIAGNOSIS — Z8249 Family history of ischemic heart disease and other diseases of the circulatory system: Secondary | ICD-10-CM | POA: Diagnosis not present

## 2017-03-05 DIAGNOSIS — I69392 Facial weakness following cerebral infarction: Secondary | ICD-10-CM | POA: Diagnosis not present

## 2017-03-05 DIAGNOSIS — K5900 Constipation, unspecified: Secondary | ICD-10-CM | POA: Diagnosis present

## 2017-03-05 DIAGNOSIS — K5901 Slow transit constipation: Secondary | ICD-10-CM | POA: Diagnosis present

## 2017-03-05 DIAGNOSIS — G8194 Hemiplegia, unspecified affecting left nondominant side: Secondary | ICD-10-CM | POA: Diagnosis not present

## 2017-03-05 DIAGNOSIS — I69319 Unspecified symptoms and signs involving cognitive functions following cerebral infarction: Secondary | ICD-10-CM

## 2017-03-05 DIAGNOSIS — Z8673 Personal history of transient ischemic attack (TIA), and cerebral infarction without residual deficits: Secondary | ICD-10-CM | POA: Diagnosis present

## 2017-03-05 DIAGNOSIS — I6521 Occlusion and stenosis of right carotid artery: Secondary | ICD-10-CM | POA: Diagnosis present

## 2017-03-05 DIAGNOSIS — I6389 Other cerebral infarction: Secondary | ICD-10-CM

## 2017-03-05 DIAGNOSIS — Q211 Atrial septal defect: Secondary | ICD-10-CM

## 2017-03-05 DIAGNOSIS — Z603 Acculturation difficulty: Secondary | ICD-10-CM

## 2017-03-05 DIAGNOSIS — N179 Acute kidney failure, unspecified: Secondary | ICD-10-CM

## 2017-03-05 DIAGNOSIS — E785 Hyperlipidemia, unspecified: Secondary | ICD-10-CM | POA: Diagnosis present

## 2017-03-05 DIAGNOSIS — M7918 Myalgia, other site: Secondary | ICD-10-CM | POA: Diagnosis present

## 2017-03-05 DIAGNOSIS — M25512 Pain in left shoulder: Secondary | ICD-10-CM | POA: Diagnosis present

## 2017-03-05 DIAGNOSIS — I69354 Hemiplegia and hemiparesis following cerebral infarction affecting left non-dominant side: Principal | ICD-10-CM

## 2017-03-05 DIAGNOSIS — R1311 Dysphagia, oral phase: Secondary | ICD-10-CM | POA: Diagnosis present

## 2017-03-05 DIAGNOSIS — M7989 Other specified soft tissue disorders: Secondary | ICD-10-CM | POA: Diagnosis not present

## 2017-03-05 DIAGNOSIS — L299 Pruritus, unspecified: Secondary | ICD-10-CM

## 2017-03-05 DIAGNOSIS — R531 Weakness: Secondary | ICD-10-CM

## 2017-03-05 DIAGNOSIS — R414 Neurologic neglect syndrome: Secondary | ICD-10-CM | POA: Diagnosis not present

## 2017-03-05 DIAGNOSIS — I639 Cerebral infarction, unspecified: Secondary | ICD-10-CM

## 2017-03-05 DIAGNOSIS — E119 Type 2 diabetes mellitus without complications: Secondary | ICD-10-CM | POA: Diagnosis present

## 2017-03-05 DIAGNOSIS — I63511 Cerebral infarction due to unspecified occlusion or stenosis of right middle cerebral artery: Secondary | ICD-10-CM | POA: Diagnosis not present

## 2017-03-05 HISTORY — PX: TEE WITHOUT CARDIOVERSION: SHX5443

## 2017-03-05 HISTORY — PX: LOOP RECORDER INSERTION: EP1214

## 2017-03-05 LAB — GLUCOSE, CAPILLARY
GLUCOSE-CAPILLARY: 130 mg/dL — AB (ref 65–99)
Glucose-Capillary: 144 mg/dL — ABNORMAL HIGH (ref 65–99)
Glucose-Capillary: 188 mg/dL — ABNORMAL HIGH (ref 65–99)
Glucose-Capillary: 119 mg/dL — ABNORMAL HIGH (ref 65–99)
Glucose-Capillary: 135 mg/dL — ABNORMAL HIGH (ref 65–99)

## 2017-03-05 SURGERY — LOOP RECORDER INSERTION

## 2017-03-05 SURGERY — ECHOCARDIOGRAM, TRANSESOPHAGEAL
Anesthesia: Moderate Sedation

## 2017-03-05 MED ORDER — GUAIFENESIN-DM 100-10 MG/5ML PO SYRP: 5.0000 mL | ORAL_SOLUTION | Freq: Four times a day (QID) | ORAL | Status: DC | PRN

## 2017-03-05 MED ORDER — MIDAZOLAM HCL 10 MG/2ML IJ SOLN
INTRAMUSCULAR | Status: DC | PRN
  Administered 2017-03-05: 1 mg via INTRAVENOUS
  Administered 2017-03-05 (×2): 2 mg via INTRAVENOUS

## 2017-03-05 MED ORDER — FENTANYL CITRATE (PF) 100 MCG/2ML IJ SOLN
INTRAMUSCULAR | Status: DC | PRN
  Administered 2017-03-05: 25 ug via INTRAVENOUS

## 2017-03-05 MED ORDER — ASPIRIN 325 MG PO TABS
325.0000 mg | ORAL_TABLET | Freq: Every day | ORAL | Status: DC
  Administered 2017-03-06 – 2017-03-17 (×12): 325 mg via ORAL
  Filled 2017-03-05 (×12): qty 1

## 2017-03-05 MED ORDER — LIDOCAINE-EPINEPHRINE 1 %-1:100000 IJ SOLN
INTRAMUSCULAR | Status: DC | PRN
  Administered 2017-03-05: 20 mL

## 2017-03-05 MED ORDER — ACETAMINOPHEN 325 MG PO TABS: 325.0000 mg | ORAL_TABLET | ORAL | Status: DC | PRN

## 2017-03-05 MED ORDER — DIPHENHYDRAMINE HCL 12.5 MG/5ML PO ELIX
12.5000 mg | ORAL_SOLUTION | Freq: Four times a day (QID) | ORAL | Status: DC | PRN
  Administered 2017-03-06: 25 mg via ORAL
  Filled 2017-03-05 (×3): qty 10

## 2017-03-05 MED ORDER — TRAZODONE HCL 50 MG PO TABS
25.0000 mg | ORAL_TABLET | Freq: Every evening | ORAL | Status: DC | PRN
  Administered 2017-03-07 – 2017-03-15 (×6): 50 mg via ORAL
  Filled 2017-03-05 (×6): qty 1

## 2017-03-05 MED ORDER — GABAPENTIN 100 MG PO CAPS
100.0000 mg | ORAL_CAPSULE | Freq: Every day | ORAL | Status: DC
  Administered 2017-03-05 – 2017-03-06 (×2): 100 mg via ORAL
  Filled 2017-03-05 (×2): qty 1

## 2017-03-05 MED ORDER — POLYETHYLENE GLYCOL 3350 17 G PO PACK
17.0000 g | PACK | Freq: Every day | ORAL | Status: DC
  Administered 2017-03-06 – 2017-03-16 (×11): 17 g via ORAL
  Filled 2017-03-05 (×12): qty 1

## 2017-03-05 MED ORDER — MIDAZOLAM HCL 5 MG/ML IJ SOLN
INTRAMUSCULAR | Status: AC
  Filled 2017-03-05: qty 1

## 2017-03-05 MED ORDER — PROCHLORPERAZINE 25 MG RE SUPP: 12.5000 mg | Freq: Four times a day (QID) | RECTAL | Status: DC | PRN

## 2017-03-05 MED ORDER — FLEET ENEMA 7-19 GM/118ML RE ENEM: 1.0000 | ENEMA | Freq: Once | RECTAL | Status: DC | PRN

## 2017-03-05 MED ORDER — PROCHLORPERAZINE MALEATE 5 MG PO TABS: 5.0000 mg | ORAL_TABLET | Freq: Four times a day (QID) | ORAL | Status: DC | PRN

## 2017-03-05 MED ORDER — MIDAZOLAM HCL 2 MG/2ML IJ SOLN
INTRAMUSCULAR | Status: DC | PRN
  Administered 2017-03-05: 1 mg via INTRAVENOUS

## 2017-03-05 MED ORDER — LIDOCAINE-EPINEPHRINE 1 %-1:100000 IJ SOLN
INTRAMUSCULAR | Status: AC
  Filled 2017-03-05: qty 1

## 2017-03-05 MED ORDER — PROCHLORPERAZINE EDISYLATE 5 MG/ML IJ SOLN: 5.0000 mg | Freq: Four times a day (QID) | INTRAMUSCULAR | Status: DC | PRN

## 2017-03-05 MED ORDER — BISACODYL 10 MG RE SUPP
10.0000 mg | Freq: Every day | RECTAL | Status: DC | PRN
  Administered 2017-03-14: 10 mg via RECTAL
  Filled 2017-03-05: qty 1

## 2017-03-05 MED ORDER — DIPHENHYDRAMINE HCL 25 MG PO CAPS: 25.0000 mg | ORAL_CAPSULE | Freq: Three times a day (TID) | ORAL | Status: DC | PRN

## 2017-03-05 MED ORDER — HYDROCERIN EX CREA
TOPICAL_CREAM | Freq: Two times a day (BID) | CUTANEOUS | Status: DC | PRN
  Filled 2017-03-05: qty 113

## 2017-03-05 MED ORDER — BUTAMBEN-TETRACAINE-BENZOCAINE 2-2-14 % EX AERO
INHALATION_SPRAY | CUTANEOUS | Status: DC | PRN
  Administered 2017-03-05: 2 via TOPICAL

## 2017-03-05 MED ORDER — ENOXAPARIN SODIUM 40 MG/0.4ML ~~LOC~~ SOLN
40.0000 mg | SUBCUTANEOUS | Status: DC
  Administered 2017-03-06 – 2017-03-16 (×11): 40 mg via SUBCUTANEOUS
  Filled 2017-03-05 (×11): qty 0.4

## 2017-03-05 MED ORDER — ACETAMINOPHEN 325 MG PO TABS
325.0000 mg | ORAL_TABLET | ORAL | Status: DC | PRN
  Administered 2017-03-05 – 2017-03-11 (×4): 650 mg via ORAL
  Filled 2017-03-05 (×4): qty 2

## 2017-03-05 MED ORDER — CAMPHOR-MENTHOL 0.5-0.5 % EX LOTN
TOPICAL_LOTION | Freq: Three times a day (TID) | CUTANEOUS | Status: DC
  Administered 2017-03-05 – 2017-03-11 (×18): via TOPICAL
  Administered 2017-03-11: 1 via TOPICAL
  Administered 2017-03-12 – 2017-03-17 (×16): via TOPICAL
  Filled 2017-03-05 (×4): qty 222

## 2017-03-05 MED ORDER — ALUM & MAG HYDROXIDE-SIMETH 200-200-20 MG/5ML PO SUSP: 30.0000 mL | ORAL | Status: DC | PRN

## 2017-03-05 MED ORDER — ATORVASTATIN CALCIUM 80 MG PO TABS: 80.0000 mg | ORAL_TABLET | Freq: Every day | ORAL | 0 refills | Status: DC

## 2017-03-05 MED ORDER — DIPHENHYDRAMINE-ZINC ACETATE 2-0.1 % EX CREA
TOPICAL_CREAM | Freq: Two times a day (BID) | CUTANEOUS | Status: DC | PRN
  Filled 2017-03-05: qty 28

## 2017-03-05 MED ORDER — ASPIRIN 325 MG PO TABS: 325.0000 mg | ORAL_TABLET | Freq: Every day | ORAL | 0 refills | Status: DC

## 2017-03-05 MED ORDER — ASPIRIN 300 MG RE SUPP: 300.0000 mg | Freq: Every day | RECTAL | Status: DC

## 2017-03-05 MED ORDER — SODIUM CHLORIDE 0.9 % IV SOLN
INTRAVENOUS | Status: DC
  Administered 2017-03-05: 12:00:00 via INTRAVENOUS

## 2017-03-05 MED ORDER — FENTANYL CITRATE (PF) 100 MCG/2ML IJ SOLN
INTRAMUSCULAR | Status: AC
  Filled 2017-03-05: qty 2

## 2017-03-05 MED ORDER — ATORVASTATIN CALCIUM 80 MG PO TABS
80.0000 mg | ORAL_TABLET | Freq: Every day | ORAL | Status: DC
  Administered 2017-03-06 – 2017-03-16 (×11): 80 mg via ORAL
  Filled 2017-03-05 (×12): qty 1

## 2017-03-05 MED ORDER — FENTANYL CITRATE (PF) 100 MCG/2ML IJ SOLN
INTRAMUSCULAR | Status: DC | PRN
  Administered 2017-03-05 (×3): 25 ug via INTRAVENOUS

## 2017-03-05 MED ORDER — ONDANSETRON HCL 4 MG/2ML IJ SOLN: 4.0000 mg | Freq: Four times a day (QID) | INTRAMUSCULAR | Status: DC | PRN

## 2017-03-05 SURGICAL SUPPLY — 2 items
LOOP REVEAL LINQSYS (Prosthesis & Implant Heart) ×3 IMPLANT
PACK LOOP INSERTION (CUSTOM PROCEDURE TRAY) ×3 IMPLANT

## 2017-03-05 NOTE — Progress Notes (Signed)
Rehab admissions - I have received authorization for acute inpatient rehab admission.  Patient is currently down for a TEE and Loop recorder today.  I can potentially admit to acute inpatient rehab later today after procedures are completed.  I do have beds available on inpatient rehab today.  Call me for questions.  #[phone]

## 2017-03-05 NOTE — Consult Note (Signed)
ELECTROPHYSIOLOGY CONSULT NOTE  Patient ID: Secily Walthour MRN: [id number], DOB/AGE: [birth year]   Admit date: 02/28/2017 Date of Consult: 03/05/2017  Primary Physician: Patient, No Pcp Per Primary Cardiologist: new [facility] Reason for Consultation: Cryptogenic stroke; recommendations regarding Implantable Loop Recorder  History of Present Illness EP has been asked to evaluate Salima Rempel for placement of an implantable loop recorder to monitor for atrial fibrillation by Dr Pearlean Brownie. She recently moved here from [place]. She is currently living in [place] but plans to live with her sister in [place] going forward. She does not speak [place] and translator was used. The patient was admitted on 02/28/2017 with left sided weakness.  They first developed symptoms several days prior to admission.  Imaging demonstrated acute large right frontal/MCA and right posterior watershed infarcts.  She has undergone workup for stroke including echocardiogram and carotid dopplers.  The patient has been monitored on telemetry which has demonstrated sinus rhythm with no arrhythmias.  Inpatient stroke work-up is to be completed with a TEE.   Echocardiogram this admission demonstrated EF 55-60%, LA 27.  Lab work is reviewed.  Prior to admission, the patient denies chest pain, shortness of breath, dizziness, palpitations, or syncope.  They are recovering from their stroke with plans to go to CIR at discharge. Her biggest concern right now is with constipation and itching.  Past Medical History:  Diagnosis Date  . CVA (cerebral vascular accident) (HCC)   . Itching    chronic issues  . Left wrist fracture      Surgical History: History reviewed. No pertinent surgical history.   Medications Prior to Admission  Medication Sig Dispense Refill Last Dose  . acetaminophen (TYLENOL) 500 MG tablet Take 500 mg by mouth every 6 (six) hours as needed for mild pain.   02/27/2017 at Unknown time    Inpatient Medications:   . aspirin  300 mg Rectal Daily   Or  . aspirin  325 mg Oral Daily  . atorvastatin  80 mg Oral q1800  . camphor-menthol   Topical TID  . docusate sodium  100 mg Oral BID  . enoxaparin (LOVENOX) injection  30 mg Subcutaneous Q24H  . insulin aspart  0-9 Units Subcutaneous TID WC  . polyethylene glycol  17 g Oral BID    Allergies: No Known Allergies  Social History   Socioeconomic History  . Marital status: Married    Spouse name: Not on file  . Number of children: Not on file  . Years of education: Not on file  . Highest education level: Not on file  Social Needs  . Financial resource strain: Not on file  . Food insecurity - worry: Not on file  . Food insecurity - inability: Not on file  . Transportation needs - medical: Not on file  . Transportation needs - non-medical: Not on file  Occupational History  . Not on file  Tobacco Use  . Smoking status: Not on file  Substance and Sexual Activity  . Alcohol use: Not on file  . Drug use: Not on file  . Sexual activity: Not on file  Other Topics Concern  . Not on file  Social History Narrative  . Not on file     Family History  Problem Relation Age of Onset  . High blood pressure Sister   . Heart Problems Sister        angina due to vasospasms      Review of Systems: All other systems reviewed and are  otherwise negative except as noted above.  Physical Exam: Vitals:   03/04/17 1732 03/04/17 2202 03/05/17 0147 03/05/17 0520  BP: 125/73 131/89 123/73 121/75  Pulse: 99 100 89 78  Resp: 18 18 18 16   Temp: 98.3 F (36.8 C) 98.3 F (36.8 C) 98.1 F (36.7 C) 98.2 F (36.8 C)  TempSrc: Oral Oral Axillary Axillary  SpO2: 100% 100% 98% 98%  Weight:        GEN- The patient is well appearing, alert and oriented x 3 today.   Head- normocephalic, atraumatic Eyes-  Sclera clear, conjunctiva pink Ears- hearing intact Oropharynx- clear Neck- supple Lungs- Clear to ausculation bilaterally, normal work of  breathing Heart- Regular rate and rhythm  GI- soft, NT, ND, + BS Extremities- no clubbing, cyanosis, or edema MS- no significant deformity or atrophy Skin- no rash or lesion Psych- euthymic mood, full affect   Labs:   Lab Results  Component Value Date   WBC 9.5 02/28/2017   HGB 17.7 (H) 02/28/2017   HCT 52.0 (H) 02/28/2017   MCV 77.4 (L) 02/28/2017   PLT 221 02/28/2017    Recent Labs  Lab 02/28/17 0919  03/04/17 0529  NA 139   < > 139  K 4.4   < > 4.2  CL 102   < > 106  CO2 24   < > 22  BUN 10   < > 27*  CREATININE 1.02*   < > 1.13*  CALCIUM 9.6   < > 9.5  PROT 8.0  --   --   BILITOT 0.6  --   --   ALKPHOS 76  --   --   ALT 16  --   --   AST 21  --   --   GLUCOSE 193*   < > 149*   < > = values in this interval not displayed.     Radiology/Studies: Ct Angio Head W Or Wo Contrast  Result Date: 02/28/2017 CLINICAL DATA:  51 year old female, code stroke. Left side paralysis, unknown time of onset. EXAM: CT ANGIOGRAPHY HEAD AND NECK CT PERFUSION BRAIN TECHNIQUE: Multidetector CT imaging of the head and neck was performed using the standard protocol during bolus administration of intravenous contrast. Multiplanar CT image reconstructions and MIPs were obtained to evaluate the vascular anatomy. Carotid stenosis measurements (when applicable) are obtained utilizing NASCET criteria, using the distal internal carotid diameter as the denominator. Multiphase CT imaging of the brain was performed following IV bolus contrast injection. Subsequent parametric perfusion maps were calculated using RAPID software. CONTRAST:  90mL ISOVUE-370 IOPAMIDOL (ISOVUE-370) INJECTION 76% COMPARISON:  Head CT without contrast 0935 hr today. FINDINGS: CT Brain Perfusion Findings: CBF (<30%) Volume: 7 mL, but underestimated due to hypodense areas of core infarct. Perfusion (Tmax>6.0s) volume: 0 milliliters Mismatch Volume: Not applicable Infarction Location:Right hemisphere. CTA NECK Skeleton: No acute  osseous abnormality identified. Upper chest: Mild gas trapping in the upper lobes. No superior mediastinal lymphadenopathy. Other neck: Negative.  No cervical lymphadenopathy. Aortic arch: 3 vessel arch configuration. Mild motion artifact. No arch atherosclerosis or great vessel origin stenosis suspected. Right carotid system: Mild motion artifact at the right CCA origin without stenosis. No atherosclerosis identified in the cervical right carotid. Widely patent right ICA origin and bulb. Left carotid system: Mild motion artifact at the left CCA origin. Minimal if any atherosclerosis in the left cervical carotid. Widely patent left ICA origin and bulb. Vertebral arteries: No proximal right subclavian artery or right vertebral artery origin plaque or  stenosis. Patent right vertebral artery to the skull base without stenosis. Mild motion artifact at the proximal subclavian artery. Normal left vertebral artery origin. Tortuous left V1 segment. Fairly codominant vertebral arteries in the neck. No plaque or stenosis in the left vertebral artery to the skull base. CTA HEAD Posterior circulation: Codominant distal vertebral arteries are patent to the vertebrobasilar junction without stenosis. Patent PICA origins. Patent basilar artery without stenosis. Normal SCA and PCA origins. Posterior communicating arteries are diminutive or absent. Bilateral PCA branches are symmetric and within normal limits. Anterior circulation: The left ICA siphon is patent with mild to moderate calcified plaque in the cavernous and supraclinoid ICA segments. Possible mild left supraclinoid segment stenosis. Patent left ICA terminus. Calcified plaque plus soft plaque or thrombus in the right ICA siphon. Short segment (3 millimeter) occlusion of the distal cavernous right ICA (series 11, image 91) with reconstituted flow at the right anterior genu and patent right supraclinoid segment. Superimposed mild to moderate right siphon atherosclerotic  stenosis. Patent right ICA terminus. Normal MCA origins. Questionable moderate stenosis of the right ACA origin. Bilateral ACA branches are within normal limits. Left MCA M1 segment, bifurcation, and left MCA branches are within normal limits. Right MCA, M1 segment bifurcation, and right MCA branches are within normal limits. No right MCA branch occlusion identified. Venous sinuses: Arterial dominant phase imaging. Grossly patent major dural venous sinuses. Anatomic variants: None. Review of the MIP images confirms the above findings IMPRESSION: 1. 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 Patent right ICA terminus and right MCA. 2. Right MCA core infarct volume underestimated by CT Perfusion, suggesting subacute ischemic timing. 3. Moderate bilateral ICA siphon atherosclerosis associated with #1. But no carotid atherosclerosis or stenosis in the neck. And no posterior circulation atherosclerosis or stenosis. 4. Study was reviewed in person with Dr. Milon Dikes on 02/28/2017 beginning at 1000 hrs. Electronically Signed   By: Odessa Fleming M.D.   On: 02/28/2017 10:19   Ct Angio Neck W Or Wo Contrast  Result Date: 02/28/2017 CLINICAL DATA:  52 year old female, code stroke. Left side paralysis, unknown time of onset. EXAM: CT ANGIOGRAPHY HEAD AND NECK CT PERFUSION BRAIN TECHNIQUE: Multidetector CT imaging of the head and neck was performed using the standard protocol during bolus administration of intravenous contrast. Multiplanar CT image reconstructions and MIPs were obtained to evaluate the vascular anatomy. Carotid stenosis measurements (when applicable) are obtained utilizing NASCET criteria, using the distal internal carotid diameter as the denominator. Multiphase CT imaging of the brain was performed following IV bolus contrast injection. Subsequent parametric perfusion maps were calculated using RAPID  software. CONTRAST:  90mL ISOVUE-370 IOPAMIDOL (ISOVUE-370) INJECTION 76% COMPARISON:  Head CT without contrast 0935 hr today. FINDINGS: CT Brain Perfusion Findings: CBF (<30%) Volume: 7 mL, but underestimated due to hypodense areas of core infarct. Perfusion (Tmax>6.0s) volume: 0 milliliters Mismatch Volume: Not applicable Infarction Location:Right hemisphere. CTA NECK Skeleton: No acute osseous abnormality identified. Upper chest: Mild gas trapping in the upper lobes. No superior mediastinal lymphadenopathy. Other neck: Negative.  No cervical lymphadenopathy. Aortic arch: 3 vessel arch configuration. Mild motion artifact. No arch atherosclerosis or great vessel origin stenosis suspected. Right carotid system: Mild motion artifact at the right CCA origin without stenosis. No atherosclerosis identified in the cervical right carotid. Widely patent right ICA origin and bulb. Left carotid system: Mild motion artifact at the left CCA origin. Minimal  if any atherosclerosis in the left cervical carotid. Widely patent left ICA origin and bulb. Vertebral arteries: No proximal right subclavian artery or right vertebral artery origin plaque or stenosis. Patent right vertebral artery to the skull base without stenosis. Mild motion artifact at the proximal subclavian artery. Normal left vertebral artery origin. Tortuous left V1 segment. Fairly codominant vertebral arteries in the neck. No plaque or stenosis in the left vertebral artery to the skull base. CTA HEAD Posterior circulation: Codominant distal vertebral arteries are patent to the vertebrobasilar junction without stenosis. Patent PICA origins. Patent basilar artery without stenosis. Normal SCA and PCA origins. Posterior communicating arteries are diminutive or absent. Bilateral PCA branches are symmetric and within normal limits. Anterior circulation: The left ICA siphon is patent with mild to moderate calcified plaque in the cavernous and supraclinoid ICA segments.  Possible mild left supraclinoid segment stenosis. Patent left ICA terminus. Calcified plaque plus soft plaque or thrombus in the right ICA siphon. Short segment (3 millimeter) occlusion of the distal cavernous right ICA (series 11, image 91) with reconstituted flow at the right anterior genu and patent right supraclinoid segment. Superimposed mild to moderate right siphon atherosclerotic stenosis. Patent right ICA terminus. Normal MCA origins. Questionable moderate stenosis of the right ACA origin. Bilateral ACA branches are within normal limits. Left MCA M1 segment, bifurcation, and left MCA branches are within normal limits. Right MCA, M1 segment bifurcation, and right MCA branches are within normal limits. No right MCA branch occlusion identified. Venous sinuses: Arterial dominant phase imaging. Grossly patent major dural venous sinuses. Anatomic variants: None. Review of the MIP images confirms the above findings IMPRESSION: 1. 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 Patent right ICA terminus and right MCA. 2. Right MCA core infarct volume underestimated by CT Perfusion, suggesting subacute ischemic timing. 3. Moderate bilateral ICA siphon atherosclerosis associated with #1. But no carotid atherosclerosis or stenosis in the neck. And no posterior circulation atherosclerosis or stenosis. 4. Study was reviewed in person with Dr. Milon Dikes on 02/28/2017 beginning at 1000 hrs. Electronically Signed   By: Odessa Fleming M.D.   On: 02/28/2017 10:19   Mr Brain Wo Contrast  Result Date: 02/28/2017 CLINICAL DATA:  Altered mental status, TIA. LEFT-sided weakness, follow-up acute RIGHT MCA and PCA territory infarcts. EXAM: MRI HEAD WITHOUT CONTRAST TECHNIQUE: Axial and coronal diffusion weighted imaging. Examination was terminated, per technologist note, patient began kicking and screaming. COMPARISON:  CT/CTA  HEAD and neck February 28, 2017 FINDINGS: Confluent RIGHT frontal lobe reduced diffusion extending to the insula with low ADC values. Patchy discontinuous RIGHT frontal lobe reduced diffusion with normalized ADC values. Confluent RIGHT parietooccipital and subcentimeter RIGHT posterior temporal lobe foci of reduced diffusion with low ADC values. Regional mass effect without midline shift. No hydrocephalus. No abnormal extra-axial fluid collections. IMPRESSION: 1. Limited 2 sequence diffusion-weighted imaging examination. 2. Acute large RIGHT frontal/MCA and RIGHT posterior watershed territory infarcts. 3. Subacute small RIGHT frontal lobe/MCA infarct. Electronically Signed   By: Awilda Metro M.D.   On: 02/28/2017 22:28   Ct Cerebral Perfusion W Contrast  Result Date: 02/28/2017 CLINICAL DATA:  52 year old female, code stroke. Left side paralysis, unknown time of onset. EXAM: CT ANGIOGRAPHY HEAD AND NECK CT PERFUSION BRAIN TECHNIQUE: Multidetector CT imaging of the head and neck was performed using the standard protocol during bolus administration of intravenous contrast. Multiplanar CT image reconstructions and MIPs  were obtained to evaluate the vascular anatomy. Carotid stenosis measurements (when applicable) are obtained utilizing NASCET criteria, using the distal internal carotid diameter as the denominator. Multiphase CT imaging of the brain was performed following IV bolus contrast injection. Subsequent parametric perfusion maps were calculated using RAPID software. CONTRAST:  90mL ISOVUE-370 IOPAMIDOL (ISOVUE-370) INJECTION 76% COMPARISON:  Head CT without contrast 0935 hr today. FINDINGS: CT Brain Perfusion Findings: CBF (<30%) Volume: 7 mL, but underestimated due to hypodense areas of core infarct. Perfusion (Tmax>6.0s) volume: 0 milliliters Mismatch Volume: Not applicable Infarction Location:Right hemisphere. CTA NECK Skeleton: No acute osseous abnormality identified. Upper chest: Mild gas trapping  in the upper lobes. No superior mediastinal lymphadenopathy. Other neck: Negative.  No cervical lymphadenopathy. Aortic arch: 3 vessel arch configuration. Mild motion artifact. No arch atherosclerosis or great vessel origin stenosis suspected. Right carotid system: Mild motion artifact at the right CCA origin without stenosis. No atherosclerosis identified in the cervical right carotid. Widely patent right ICA origin and bulb. Left carotid system: Mild motion artifact at the left CCA origin. Minimal if any atherosclerosis in the left cervical carotid. Widely patent left ICA origin and bulb. Vertebral arteries: No proximal right subclavian artery or right vertebral artery origin plaque or stenosis. Patent right vertebral artery to the skull base without stenosis. Mild motion artifact at the proximal subclavian artery. Normal left vertebral artery origin. Tortuous left V1 segment. Fairly codominant vertebral arteries in the neck. No plaque or stenosis in the left vertebral artery to the skull base. CTA HEAD Posterior circulation: Codominant distal vertebral arteries are patent to the vertebrobasilar junction without stenosis. Patent PICA origins. Patent basilar artery without stenosis. Normal SCA and PCA origins. Posterior communicating arteries are diminutive or absent. Bilateral PCA branches are symmetric and within normal limits. Anterior circulation: The left ICA siphon is patent with mild to moderate calcified plaque in the cavernous and supraclinoid ICA segments. Possible mild left supraclinoid segment stenosis. Patent left ICA terminus. Calcified plaque plus soft plaque or thrombus in the right ICA siphon. Short segment (3 millimeter) occlusion of the distal cavernous right ICA (series 11, image 91) with reconstituted flow at the right anterior genu and patent right supraclinoid segment. Superimposed mild to moderate right siphon atherosclerotic stenosis. Patent right ICA terminus. Normal MCA origins.  Questionable moderate stenosis of the right ACA origin. Bilateral ACA branches are within normal limits. Left MCA M1 segment, bifurcation, and left MCA branches are within normal limits. Right MCA, M1 segment bifurcation, and right MCA branches are within normal limits. No right MCA branch occlusion identified. Venous sinuses: Arterial dominant phase imaging. Grossly patent major dural venous sinuses. Anatomic variants: None. Review of the MIP images confirms the above findings IMPRESSION: 1. 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 Patent right ICA terminus and right MCA. 2. Right MCA core infarct volume underestimated by CT Perfusion, suggesting subacute ischemic timing. 3. Moderate bilateral ICA siphon atherosclerosis associated with #1. But no carotid atherosclerosis or stenosis in the neck. And no posterior circulation atherosclerosis or stenosis. 4. Study was reviewed in person with Dr. Milon Dikes on 02/28/2017 beginning at 1000 hrs. Electronically Signed   By: Odessa Fleming M.D.   On: 02/28/2017 10:19   Dg Chest Port 1 View  Result Date: 02/28/2017 CLINICAL DATA:  Headache, fever, shortness of breath. Mid chest pain. EXAM: PORTABLE CHEST 1 VIEW COMPARISON:  None FINDINGS: Cardiomegaly. Lungs are  clear. No effusions. No acute bony abnormality. IMPRESSION: Cardiomegaly.  No active disease. Electronically Signed   By: Charlett NoseKevin  Dover M.D.   On: 02/28/2017 10:37   Ct Head Code Stroke Wo Contrast  Result Date: 02/28/2017 CLINICAL DATA:  Code stroke. 52 year old female with left side paralysis. Unknown time of onset. EXAM: CT HEAD WITHOUT CONTRAST TECHNIQUE: Contiguous axial images were obtained from the base of the skull through the vertex without intravenous contrast. COMPARISON:  None. FINDINGS: Brain: Multifocal confluent areas of gray and white matter hypodensity are scattered in the right  hemisphere, with wedge-shaped peripheral areas of involvement in the right middle frontal gyrus, right parietal lobe, and superior right occipital lobe. Additional smaller areas of involvement including the superior right peri-Rolandic cortex (series 3, image 25). No acute intracranial hemorrhage identified. No associated intracranial mass effect. Gray-white matter differentiation in the right temporal lobe and right basal ganglia is preserved. Left hemisphere, thalamic, and posterior fossa gray-white matter differentiation appears normal. No ventriculomegaly.  Normal basilar cisterns. Vascular: Calcified atherosclerosis at the skull base. No suspicious intracranial vascular hyperdensity. Skull: Negative. Sinuses/Orbits: Clear. Other: Visualized orbits and scalp soft tissues are within normal limits. ASPECTS [facility]([place] Stroke Program Early CT Score) - Ganglionic level infarction (caudate, lentiform nuclei, internal capsule, insula, M1-M3 cortex): 6 (abnormal M1 segment) - Supraganglionic infarction (M4-M6 cortex): 0 (M4 through and 6 abnormal). Total score (0-10 with 10 being normal): 6 IMPRESSION: 1. Multifocal subacute appearing cortical infarcts in the right hemisphere, primarily the right MCA territory although there is also superior right occipital lobe involvement. The right basal ganglia are spared. 2. No associated hemorrhage or intracranial mass effect. 3. ASPECTS is 6. 4. These results were communicated to Dr. Wilford CornerArora at 9:46 amon 1/23/2019by text page via the [facility] messaging system. Electronically Signed   By: Odessa FlemingH  Hall M.D.   On: 02/28/2017 09:47    12-lead ECG sinus rhythm (personally reviewed)  Telemetry sinus rhythm/sinus tach (personally reviewed)  Assessment and Plan:  1. Cryptogenic stroke The patient presents with cryptogenic stroke.  The patient has a TEE planned for this later today.  I spoke at length with the patient and her family about monitoring for afib with an implantable loop  recorder.  Risks, benefits, and alteratives to implantable loop recorder were discussed with the patient today.   At this time, the patient is very clear in their decision to proceed with implantable loop recorder. [place] and has good family support that speaks [place], I think this is reasonable.  We discussed that we will need good contact numbers for family members that speak English in the event that AF is identified.  The risks and benefits of transesophageal echocardiogram have been explained including risks of esophageal damage, perforation (1:10,000 risk), bleeding, pharyngeal hematoma as well as other potential complications associated with conscious sedation including aspiration, arrhythmia, respiratory failure and death. Alternatives to treatment were discussed, questions were answered. Patient is willing to proceed.   Dr Ladona Ridgelaylor to see later today  Please call with questions.   Gypsy BalsamAmber Seiler, NP 03/05/2017 10:07 AM  EP Attending  Patient seen and examined. Agree with the findings as noted above by Gypsy BalsamAmber Seiler, NP-C. The patient has had a cryptogenic stroke and workup so far has been negative. I have discussed the indications/risks/benefits/goals/expectations of ILR insertion with the patient by way of [place] interpreter and she wishes to proceed.  Leonia ReevesGregg Elanie Hammitt,M.D.

## 2017-03-05 NOTE — Progress Notes (Signed)
Pt. Discharging to CIR. Taken up to 4 west by stretcher.  Husband Has all belongings  All questions and concerns addressed.
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
## 2017-03-05 NOTE — Care Management Note (Signed)
Case Management Note  Patient Details  Name: Kristen Short MRN: [id number] Date of Birth: 1965/10/17  Subjective/Objective:                    Action/Plan: Pt discharging to CIR. [facility] [place] [place] [facility] [facility] [place] [facility]. No further needs per CM.  Expected Discharge Date:  03/05/17               Expected Discharge Plan:  IP Rehab Facility  In-House Referral:     Discharge planning Services  CM Consult  Post Acute Care Choice:    Choice offered to:     DME Arranged:    DME Agency:     HH Arranged:    HH Agency:     Status of Service:  Completed, signed off  If discussed at [employer] Meetings, dates discussed:    Additional Comments:  Kristen BaloKelli F Madi Bonfiglio, RN 03/05/2017, 4:51 PM

## 2017-03-05 NOTE — Progress Notes (Signed)
Retta Diones, RN  Rehab Admission Coordinator  Physical Medicine and Rehabilitation  PMR Pre-admission  Signed  Date of Service:  03/05/2017 12:54 PM          Signed            []Hide copied text  []Hover for details   PMR Admission Coordinator Pre-Admission Assessment  Patient: Kristen Short is an 52 y.o., female MRN: [id number] DOB: 05-19-1965 Height:   Weight: 73.9 kg (163 lb)                          Insurance Information HMO:      PPO: Yes     PCP:       IPA:       80/20:       OTHER:   PRIMARY: [employer]      Policy#: [id number]      Subscriber: [place] Name: [name]      Phone#: [phone]     Fax#: [phone] Pre-Cert#:  [id number]       Employer: [employer] Benefits:  Phone #: [phone]     Name: On line Eff. Date: 02/06/17     Deduct: $5000 (met $0)      Out of Pocket Max: [phone] (met $0)      Life Max: None CIR: 80% with $500 copay      SNF: 80% with 60 days max Outpatient: medical necessity     Co-Pay: $30/visit Home Health: 80%      Co-Pay: 20% DME: 80%     Co-Pay: 20% Providers: in network  Medicaid Application Date:        Case Manager:   Disability Application Date:        Case Worker:    Emergency Tax adviser Information    Name Relation Home Work Mobile   [email or web address],ysur Spouse   [phone]   Joaquim Lai Sister   [place] History  Patient Admitting Diagnosis: R MCA infarct  History of Present Illness: A 52 y.o.femalewith no known prior medical historyandimmigrated from [place] a year ago.She was admitted on 02/28/17 with reports of HA since last Saturday followed by left sided weakness and difficulty walking. 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 MRI brain done revealing acute large right frontal MCA and  right posterior watershed territory infarcts and subacute small right frontal lobe/MCA infarct with regional mass effect. 2 D echo Done today and work up underway. Patient with resultant dense left hemiparesis, left facial weakness, left inattention with right gaze preference, and cognitive deficits. Therapy evaluations done revealing functional deficits and CIR recommended for follow up therapy. Sister in room helps to translate.  TEE done and Loop recorder placed 03/05/17.    Total: 12=NIH  Past Medical History  Past Medical History:  Diagnosis Date  . CVA (cerebral vascular accident) ([place])   . Itching    chronic issues  . Left wrist fracture     Family History  family history includes Heart Problems in her sister; High blood pressure in her sister.  Prior Rehab/Hospitalizations:  Has the patient had major surgery during 100 days prior to admission? No  Current Medications   Current Facility-Administered Medications:  .  [DISCONTINUED] acetaminophen (TYLENOL) tablet 650 mg, 650  mg, Oral, Q6H PRN, 650 mg at 03/01/17 2245 **OR** acetaminophen (TYLENOL) suppository 650 mg, 650 mg, Rectal, Q6H PRN, Verner Mould, MD, 650 mg at 03/01/17 0527 .  acetaminophen (TYLENOL) tablet 325-650 mg, 325-650 mg, Oral, Q4H PRN, Evans Lance, MD .  aspirin suppository 300 mg, 300 mg, Rectal, Daily **OR** aspirin tablet 325 mg, 325 mg, Oral, Daily, Verner Mould, MD, 325 mg at 03/04/17 0934 .  atorvastatin (LIPITOR) tablet 80 mg, 80 mg, Oral, q1800, Verner Mould, MD, 80 mg at 03/04/17 1818 .  camphor-menthol (SARNA) lotion, , Topical, TID, Steve Rattler, DO, 1 application at 82/80/03 2159 .  diphenhydrAMINE (BENADRYL) capsule 25 mg, 25 mg, Oral, Q8H PRN, Riccio, Angela C, DO, 25 mg at 03/05/17 1450 .  diphenhydrAMINE-zinc acetate (BENADRYL) 2-0.1 % cream, , Topical, BID PRN, Rumball, Alison, DO .  docusate sodium (COLACE) capsule 100 mg, 100 mg, Oral,  BID, Nicolette Bang, DO, 100 mg at 03/04/17 2154 .  enoxaparin (LOVENOX) injection 30 mg, 30 mg, Subcutaneous, Q24H, Verner Mould, MD, 30 mg at 03/04/17 1819 .  hydrocerin (EUCERIN) cream, , Topical, BID PRN, Rory Percy, DO .  insulin aspart (novoLOG) injection 0-9 Units, 0-9 Units, Subcutaneous, TID WC, Rumball, Alison, DO, 1 Units at 03/05/17 0830 .  ondansetron (ZOFRAN) injection 4 mg, 4 mg, Intravenous, Q6H PRN, Evans Lance, MD .  ondansetron [facility]) tablet 4 mg, 4 mg, Oral, Q6H PRN **OR** [DISCONTINUED] ondansetron (ZOFRAN) injection 4 mg, 4 mg, Intravenous, Q6H PRN, Verner Mould, MD .  polyethylene glycol (MIRALAX / GLYCOLAX) packet 17 g, 17 g, Oral, BID, Nicolette Bang, DO, 17 g at 03/04/17 2153  Patients Current Diet: Fall precautions Diet Heart Room service appropriate? Yes; Fluid consistency: Thin  Precautions / Restrictions Precautions Precautions: Fall, Other (comment) Precaution Comments: No UE movement /moderate L LE weakness, L inattention, impulsive  Restrictions Weight Bearing Restrictions: No   Has the patient had 2 or more falls or a fall with injury in the past year?No.  Patient did fall 10 years ago and fracture her left wrist.  Also patient has fallen 1 time recently.    Prior Activity Level Community (5-7x/wk): Worked FT second shift 3:30 p to 10:30p.  Did not drive.  Home Assistive Devices / Equipment None  Prior Device Use: Indicate devices/aids used by the patient prior to current illness, exacerbation or injury? None  Prior Functional Level Prior Function Level of Independence: Independent  Self Care: Did the patient need help bathing, dressing, using the toilet or eating?  Independent  Indoor Mobility: Did the patient need assistance with walking from room to room (with or without device)? Independent  Stairs: Did the patient need assistance with internal or external stairs (with or  without device)? Independent  Functional Cognition: Did the patient need help planning regular tasks such as shopping or remembering to take medications? Independent  Current Functional Level Cognition  Arousal/Alertness: Awake/alert Overall Cognitive Status: Impaired/Different from baseline Current Attention Level: Sustained Orientation Level: Oriented X4 Safety/Judgement: Decreased awareness of safety, Decreased awareness of deficits General Comments: impulsive, patient reveals she is able to speak some English- enough to follow commands-, all other education provided through video interpreter today  Attention: Sustained Sustained Attention: Impaired Sustained Attention Impairment: Verbal complex, Functional complex Memory: Impaired Memory Impairment: Retrieval deficit, Decreased short term memory Decreased Short Term Memory: Verbal basic Awareness: Impaired Awareness Impairment: Intellectual impairment Problem Solving: Impaired Problem Solving Impairment: Verbal complex Executive Function: Reasoning, Decision  Making Reasoning: Impaired Reasoning Impairment: Verbal complex, Functional complex Decision Making: Impaired Decision Making Impairment: Verbal complex, Functional complex Behaviors: Impulsive Safety/Judgment: Impaired    Extremity Assessment (includes Sensation/Coordination)  Upper Extremity Assessment: LUE deficits/detail LUE Deficits / Details: Flaccid, PROM WNL, pt reports feels numb, left inattention LUE Coordination: decreased fine motor, decreased gross motor  Lower Extremity Assessment: LLE deficits/detail LLE Deficits / Details: L LE strength approximately 3-/5  LLE Sensation: decreased proprioception    ADLs  Overall ADL's : Needs assistance/impaired Eating/Feeding: Set up, Supervision/ safety Eating/Feeding Details (indicate cue type and reason): supported sitting Grooming: Moderate assistance Grooming Details (indicate cue type and reason):  supported sitting Upper Body Bathing: Moderate assistance Upper Body Bathing Details (indicate cue type and reason): supported sitting Lower Body Bathing: Maximal assistance Lower Body Bathing Details (indicate cue type and reason): Mod +2 sit<>stand for safety Upper Body Dressing : Maximal assistance, Sitting Upper Body Dressing Details (indicate cue type and reason): EOB without external support, cues for Pt to use RUE to assist LUE into hospital gown Lower Body Dressing: Maximal assistance, Sitting/lateral leans Lower Body Dressing Details (indicate cue type and reason): Pt able to don R sock without assist, OT initiated sock donning on L foot, Pt able to pull it on and up the rest of the way with RUE Toilet Transfer: +2 for safety/equipment, Moderate assistance, Stand-pivot, BSC Toilet Transfer Details (indicate cue type and reason): transferred to the right (strong side) Toileting- Clothing Manipulation and Hygiene: Moderate assistance Toileting - Clothing Manipulation Details (indicate cue type and reason): Mod +2 sit<>stand for safety General ADL Comments: Pt continues to be impulsive throughout session, eager to make progress and desire to get as much function back as possible comes back as impulsivity    Mobility  Overal bed mobility: Needs Assistance Bed Mobility: Supine to Sit Supine to sit: Mod assist, HOB elevated General bed mobility comments: to bring LEs around, bring trunk up at edge of bed    Transfers  Overall transfer level: Needs assistance Equipment used: 2 person hand held assist Transfers: Sit to/from Stand, Stand Pivot Transfers Sit to Stand: Mod assist, +2 safety/equipment Stand pivot transfers: Mod assist, +2 safety/equipment General transfer comment: +2 for safety, patient continues to be impulsive; able to shift weight briefly onto L LE to take steps with R     Ambulation / Gait / Stairs / Wheelchair Mobility  Ambulation/Gait General Gait Details: DNT  today     Posture / Balance Dynamic Sitting Balance Sitting balance - Comments: forward lean today requiring min guard; Pt able to long sit at supervision level in bed today Balance Overall balance assessment: Needs assistance Sitting-balance support: Feet supported Sitting balance-Leahy Scale: Fair Sitting balance - Comments: forward lean today requiring min guard; Pt able to long sit at supervision level in bed today Standing balance support: During functional activity, Bilateral upper extremity supported Standing balance-Leahy Scale: Poor Standing balance comment: reliant on assistance to maintain balance during transfer     Special needs/care consideration BiPAP/CPAP No CPM No Continuous Drip IV KVO Dialysis No        Life Vest No Oxygen No Special Bed No Trach Size no Wound Vac (area) No     Skin: Itchy skin                            Bowel mgmt: Last documented BM 03/01/17 Bladder mgmt:Voiding WDL Diabetic mgmt No     Previous  Home Environment Living Arrangements: Spouse/significant other, Children  Lives With: Spouse Available Help at Discharge: Family Type of Home: Apartment Home Layout: One level Additional Comments: unsure, patient independent at baseline however   Discharge Living Setting Plans for Discharge Living Setting: House, Lives with (comment)(Plans to go home with sister and brother-in-law.) Type of Home at Discharge: House Discharge Home Layout: One level Discharge Home Access: Stairs to enter Entrance Stairs-Number of Steps: 4 steps Does the patient have any problems obtaining your medications?: No  Social/Family/Support Systems Patient Roles: Spouse, Parent, Other (Comment) Contact Information: Alphia Kava - brother-in-law - [phone] Anticipated Caregiver: Hlir Chi - sister Anticipated Caregiver's Contact Information: Hlir Chi - sister - [phone] Ability/Limitations of Caregiver: Sister stays at home and can provide  supervision and light assistance after rehab discharge Caregiver Availability: 24/7 Discharge Plan Discussed with Primary Caregiver: Yes(Talked with husband and sister.) Is Caregiver In Agreement with Plan?: Yes Does Caregiver/Family have Issues with Lodging/Transportation while Pt is in Rehab?: No(Patient lived in [place].  Has family in [place].)  Goals/Additional Needs Patient/Family Goal for Rehab: PT/OT/SLP min assist goals Expected length of stay: 19-21 days Cultural Considerations: Speaks [place], is [place]. Dietary Needs: Dys 3, thin liquids Equipment Needs: TBD Additional Information: Lives in [place], has sister here in [place].  Has 4 children total.  Two are in [place] and the other 2 are here is [place].  One sone here is 24 and another child is ? 8th grade.  Will go home with sister here in [place].  Husband does not speak much [place].  Patient has only been here about 1 year. Pt/Family Agrees to Admission and willing to participate: Yes Program Orientation Provided & Reviewed with Pt/Caregiver Including Roles  & Responsibilities: Yes  Decrease burden of Care through IP rehab admission: N/A  Possible need for SNF placement upon discharge: Not planned  Patient Condition: This patient's medical and functional status has changed since the consult dated: 03/01/17 in which the Rehabilitation Physician determined and documented that the patient's condition is appropriate for intensive rehabilitative care in an inpatient rehabilitation facility. See "History of Present Illness" (above) for medical update. Functional changes are: Currently requiring mod to max assist +2 for mobility and ADLs. Patient's medical and functional status update has been discussed with the Rehabilitation physician and patient remains appropriate for inpatient rehabilitation. Will admit to inpatient rehab today.  Preadmission Screen Completed By:  Retta Diones, 03/05/2017 3:47  PM ______________________________________________________________________   Discussed status with Dr. Posey Pronto on 03/05/17 at 1543 and received telephone approval for admission today.  Admission Coordinator:  Retta Diones, time 1547/Date 03/05/17             Cosigned by: Jamse Arn, MD at 03/05/2017 4:22 PM  Revision History

## 2017-03-05 NOTE — Progress Notes (Signed)
Erick Colace, MD  Physician  Physical Medicine and Rehabilitation  Consult Note  Signed  Date of Service:  03/01/2017 2:56 PM       Related encounter: ED to Hosp-Admission (Current) from 02/28/2017 in [facility]      Signed      Expand All Collapse All       [] Hide copied text  [] Hover for details        Physical Medicine and Rehabilitation Consult   Reason for Consult: Stroke with functional deficits.  Referring Physician: Dr. Jennette Kettle.    HPI: Cythia Bucks is a 52 y.o. female with no known prior medical history and  immigrated from [place] a year ago.  She was admitted on 02/28/17 with reports of HA since last Saturday followed by left sided weakness and difficulty walking.  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  2 D echo  Done today and work up underway. Patient with resultant dense left hemiparesis, left facial weakness, left inattention with right gaze preference, and cognitive deficits. Therapy evaluations done revealing functional deficits and CIR recommended for follow up therapy.    Sister in room helps to translate  Review of Systems  Constitutional: Negative for chills and fever.  Eyes: Positive for double vision.  Respiratory: Negative for cough and shortness of breath.   Cardiovascular: Negative for chest pain and palpitations.  Gastrointestinal: Negative for nausea and vomiting.  Musculoskeletal: Positive for myalgias (left shoulder pain).  Skin: Positive for itching (chronic problem for years).  Neurological: Positive for speech change, focal weakness and headaches.    Psychiatric/Behavioral: The patient is nervous/anxious.           Past Medical History:  Diagnosis Date  . Itching    chronic issues  . Left wrist fracture      History reviewed. No pertinent surgical history.         Family History  Problem Relation Age of Onset  . High blood pressure Sister   . Heart Problems Sister        angina due to vasospasms     Social History:  Married. She lives in [place] with husband and 2 sons  but has family in [place]. Independent and working PTA.  She does not use tobacco, alcohol or illicit drugs.     Allergies: No Known Allergies          Medications Prior to Admission  Medication Sig Dispense Refill  . acetaminophen (TYLENOL) 500 MG tablet Take 500 mg by mouth every 6 (six) hours as needed for mild pain.      Home: Home Living Family/patient expects to be discharged to:: Private residence Living Arrangements: Spouse/significant other, Children Available Help at Discharge: Family Type of Home: Apartment Home Layout: One level Additional Comments: unsure, patient independent at baseline however   Lives With: Spouse  Functional History: Prior Function Level of Independence: Independent Functional Status:  Mobility: Bed Mobility Overal bed mobility: Needs Assistance Bed Mobility: Supine to Sit Supine to sit: Min assist, +2 for safety/equipment General bed mobility comments: to bring LEs around, bring trunk up at edge of bed  Transfers Overall transfer level: Needs assistance Equipment used: None Transfers:  Sit to/from Stand, [employer] Transfers Sit to Stand: Mod assist, +2 safety/equipment Stand pivot transfers: Mod assist, +2 safety/equipment General transfer comment: +2 assist for safety today, patient impulsive and requiring cues for safety and sequencing  Ambulation/Gait General Gait Details: DNT today   ADL: ADL Overall ADL's : Needs assistance/impaired Eating/Feeding: Set up, Supervision/  safety Eating/Feeding Details (indicate cue type and reason): supported sitting Grooming: Moderate assistance Grooming Details (indicate cue type and reason): supported sitting Upper Body Bathing: Moderate assistance Upper Body Bathing Details (indicate cue type and reason): supported sitting Lower Body Bathing: Maximal assistance Lower Body Bathing Details (indicate cue type and reason): Mod +2 sit<>stand for safety Upper Body Dressing : Maximal assistance Upper Body Dressing Details (indicate cue type and reason): supported sitting Lower Body Dressing: Total assistance Lower Body Dressing Details (indicate cue type and reason): Mod +2 sit<>stand for safety Toilet Transfer: +2 for safety/equipment, Moderate assistance, Stand-pivot, BSC Toileting- Clothing Manipulation and Hygiene: Moderate assistance Toileting - Clothing Manipulation Details (indicate cue type and reason): Mod +2 sit<>stand for safety  Cognition: Cognition Overall Cognitive Status: Impaired/Different from baseline Arousal/Alertness: Awake/alert Orientation Level: Oriented to person, Oriented to place, Oriented to time, Disoriented to situation Attention: Sustained Sustained Attention: Impaired Sustained Attention Impairment: Verbal complex, Functional complex Memory: Impaired Memory Impairment: Retrieval deficit, Decreased short term memory Decreased Short Term Memory: Verbal basic Awareness: Impaired Awareness Impairment: Intellectual impairment Problem Solving: Impaired Problem Solving Impairment: Verbal complex Executive Function: Reasoning, Decision Making Reasoning: Impaired Reasoning Impairment: Verbal complex, Functional complex Decision Making: Impaired Decision Making Impairment: Verbal complex, Functional complex Behaviors: Impulsive Safety/Judgment: Impaired Cognition Arousal/Alertness: Awake/alert Behavior During Therapy: Impulsive Overall Cognitive Status: Impaired/Different from baseline Area  of Impairment: Safety/judgement, Problem solving, Attention Current Attention Level: Sustained Safety/Judgement: Decreased awareness of safety, Decreased awareness of deficits Problem Solving: Requires verbal cues, Requires tactile cues, Difficulty sequencing General Comments: impulsive, does not speak English also making it difficult to assess cognition    Blood pressure (!) 151/90, pulse 90, temperature 98.9 F (37.2 C), temperature source Oral, resp. rate 18, weight 74 kg (163 lb 2.3 oz), last menstrual period 01/29/2017, SpO2 99 %. Physical Exam  Nursing note and vitals reviewed. Constitutional: She appears well-developed and well-nourished. No distress.  HENT:  Head: Normocephalic and atraumatic.  Mouth/Throat: Oropharynx is clear and moist.  Eyes: Conjunctivae are normal. Pupils are equal, round, and reactive to light.  Neck: Normal range of motion. Neck supple.  Cardiovascular: Normal rate and regular rhythm.  No murmur heard. Respiratory: Effort normal and breath sounds normal. No stridor. No respiratory distress. She has no wheezes.  GI: Soft. Bowel sounds are normal. She exhibits no distension. There is no tenderness.  Neurological: She is alert.  Restless and distracted.  Left facial weakness with left inattention. Impulsive. Able to follow simple motor commands with perseverative behaviors.   Skin: Skin is warm and dry. She is not diaphoretic.  Motor strength is 0/5 in the left upper limb, 3- in the left hip flexor knee extensor ankle dorsiflexor plantar flexor 5/5 in the right deltoid bicep tricep grip hip flexor knee extensor ankle dorsiflexor Sensation intact in the upper and lower limbs to light touch. Patient has diplopia which corrects with closure of the left eye. Left central 7 palsy  LabResultsLast24Hours  Results for orders placed or performed during the hospital encounter of 02/28/17 (from the past 24 hour(s))  Hemoglobin A1c     Status: Abnormal    Collection Time: 03/01/17  3:27 AM  Result Value Ref Range   Hgb A1c MFr Bld 7.5 (H) 4.8 - 5.6 %   Mean Plasma Glucose 168.55 mg/dL  Lipid panel     Status: Abnormal   Collection Time: 03/01/17  3:27 AM  Result Value Ref Range   Cholesterol 236 (H) 0 - 200 mg/dL   Triglycerides 161 <096 mg/dL   HDL 36 (L) >04 mg/dL   Total CHOL/HDL Ratio 6.6 RATIO   VLDL 27 0 - 40 mg/dL   LDL Cholesterol 540 (H) 0 - 99 mg/dL  Basic metabolic panel     Status: Abnormal   Collection Time: 03/01/17  1:34 PM  Result Value Ref Range   Sodium 136 135 - 145 mmol/L   Potassium 4.0 3.5 - 5.1 mmol/L   Chloride 103 101 - 111 mmol/L   CO2 22 22 - 32 mmol/L   Glucose, Bld 191 (H) 65 - 99 mg/dL   BUN 20 6 - 20 mg/dL   Creatinine, Ser 9.81 (H) 0.44 - 1.00 mg/dL   Calcium 9.5 8.9 - 19.1 mg/dL   GFR calc non Af Amer 59 (L) >60 mL/min   GFR calc Af Amer >60 >60 mL/min   Anion gap 11 5 - 15      ImagingResults(Last48hours)  Ct Angio Head W Or Wo Contrast  Result Date: 02/28/2017 CLINICAL DATA:  52 year old female, code stroke. Left side paralysis, unknown time of onset. EXAM: CT ANGIOGRAPHY HEAD AND NECK CT PERFUSION BRAIN TECHNIQUE: Multidetector CT imaging of the head and neck was performed using the standard protocol during bolus administration of intravenous contrast. Multiplanar CT image reconstructions and MIPs were obtained to evaluate the vascular anatomy. Carotid stenosis measurements (when applicable) are obtained utilizing NASCET criteria, using the distal internal carotid diameter as the denominator. Multiphase CT imaging of the brain was performed following IV bolus contrast injection. Subsequent parametric perfusion maps were calculated using RAPID software. CONTRAST:  90mL ISOVUE-370 IOPAMIDOL (ISOVUE-370) INJECTION 76% COMPARISON:  Head CT without contrast 0935 hr today. FINDINGS: CT Brain Perfusion Findings: CBF (<30%) Volume: 7 mL, but underestimated due to hypodense areas  of core infarct. Perfusion (Tmax>6.0s) volume: 0 milliliters Mismatch Volume: Not applicable Infarction Location:Right hemisphere. CTA NECK Skeleton: No acute osseous abnormality identified. Upper chest: Mild gas trapping in the upper lobes. No superior mediastinal lymphadenopathy. Other neck: Negative.  No cervical lymphadenopathy. Aortic arch: 3 vessel arch configuration. Mild motion artifact. No arch atherosclerosis or great vessel origin stenosis suspected. Right carotid system: Mild motion artifact at the right CCA origin without stenosis. No atherosclerosis identified in the cervical right carotid. Widely patent right ICA origin and bulb. Left carotid system: Mild motion artifact at the left CCA origin. Minimal if any atherosclerosis in the left cervical carotid. Widely patent left ICA origin and bulb. Vertebral arteries: No proximal right subclavian artery or right vertebral artery origin plaque or stenosis. Patent right vertebral artery to the skull base without stenosis. Mild motion artifact at the proximal subclavian artery. Normal left vertebral artery origin. Tortuous left V1 segment. Fairly codominant vertebral arteries in the neck. No plaque or stenosis in the left vertebral artery to the skull base. CTA HEAD Posterior circulation: Codominant distal vertebral arteries are patent to the vertebrobasilar junction without stenosis. Patent PICA origins. Patent basilar artery without stenosis. Normal SCA and PCA origins. Posterior communicating arteries are diminutive or absent. Bilateral PCA branches are symmetric and within normal limits. Anterior circulation: The left ICA siphon is patent with mild to moderate calcified plaque in the  cavernous and supraclinoid ICA segments. Possible mild left supraclinoid segment stenosis. Patent left ICA terminus. Calcified plaque plus soft plaque or thrombus in the right ICA siphon. Short segment (3 millimeter) occlusion of the distal cavernous right ICA (series 11,  image 91) with reconstituted flow at the right anterior genu and patent right supraclinoid segment. Superimposed mild to moderate right siphon atherosclerotic stenosis. Patent right ICA terminus. Normal MCA origins. Questionable moderate stenosis of the right ACA origin. Bilateral ACA branches are within normal limits. Left MCA M1 segment, bifurcation, and left MCA branches are within normal limits. Right MCA, M1 segment bifurcation, and right MCA branches are within normal limits. No right MCA branch occlusion identified. Venous sinuses: Arterial dominant phase imaging. Grossly patent major dural venous sinuses. Anatomic variants: None. Review of the MIP images confirms the above findings IMPRESSION: 1. 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 Patent right ICA terminus and right MCA. 2. Right MCA core infarct volume underestimated by CT Perfusion, suggesting subacute ischemic timing. 3. Moderate bilateral ICA siphon atherosclerosis associated with #1. But no carotid atherosclerosis or stenosis in the neck. And no posterior circulation atherosclerosis or stenosis. 4. Study was reviewed in person with Dr. Milon Dikes on 02/28/2017 beginning at 1000 hrs. Electronically Signed   By: Odessa Fleming M.D.   On: 02/28/2017 10:19   Ct Angio Neck W Or Wo Contrast  Result Date: 02/28/2017 CLINICAL DATA:  52 year old female, code stroke. Left side paralysis, unknown time of onset. EXAM: CT ANGIOGRAPHY HEAD AND NECK CT PERFUSION BRAIN TECHNIQUE: Multidetector CT imaging of the head and neck was performed using the standard protocol during bolus administration of intravenous contrast. Multiplanar CT image reconstructions and MIPs were obtained to evaluate the vascular anatomy. Carotid stenosis measurements (when applicable) are obtained utilizing NASCET criteria, using the distal internal carotid diameter as the  denominator. Multiphase CT imaging of the brain was performed following IV bolus contrast injection. Subsequent parametric perfusion maps were calculated using RAPID software. CONTRAST:  90mL ISOVUE-370 IOPAMIDOL (ISOVUE-370) INJECTION 76% COMPARISON:  Head CT without contrast 0935 hr today. FINDINGS: CT Brain Perfusion Findings: CBF (<30%) Volume: 7 mL, but underestimated due to hypodense areas of core infarct. Perfusion (Tmax>6.0s) volume: 0 milliliters Mismatch Volume: Not applicable Infarction Location:Right hemisphere. CTA NECK Skeleton: No acute osseous abnormality identified. Upper chest: Mild gas trapping in the upper lobes. No superior mediastinal lymphadenopathy. Other neck: Negative.  No cervical lymphadenopathy. Aortic arch: 3 vessel arch configuration. Mild motion artifact. No arch atherosclerosis or great vessel origin stenosis suspected. Right carotid system: Mild motion artifact at the right CCA origin without stenosis. No atherosclerosis identified in the cervical right carotid. Widely patent right ICA origin and bulb. Left carotid system: Mild motion artifact at the left CCA origin. Minimal if any atherosclerosis in the left cervical carotid. Widely patent left ICA origin and bulb. Vertebral arteries: No proximal right subclavian artery or right vertebral artery origin plaque or stenosis. Patent right vertebral artery to the skull base without stenosis. Mild motion artifact at the proximal subclavian artery. Normal left vertebral artery origin. Tortuous left V1 segment. Fairly codominant vertebral arteries in the neck. No plaque or stenosis in the left vertebral artery to the skull base. CTA HEAD Posterior circulation: Codominant distal vertebral arteries are patent to the vertebrobasilar junction without stenosis. Patent PICA origins. Patent basilar artery without stenosis. Normal SCA and PCA origins. Posterior communicating arteries  are diminutive or absent. Bilateral PCA branches are symmetric  and within normal limits. Anterior circulation: The left ICA siphon is patent with mild to moderate calcified plaque in the cavernous and supraclinoid ICA segments. Possible mild left supraclinoid segment stenosis. Patent left ICA terminus. Calcified plaque plus soft plaque or thrombus in the right ICA siphon. Short segment (3 millimeter) occlusion of the distal cavernous right ICA (series 11, image 91) with reconstituted flow at the right anterior genu and patent right supraclinoid segment. Superimposed mild to moderate right siphon atherosclerotic stenosis. Patent right ICA terminus. Normal MCA origins. Questionable moderate stenosis of the right ACA origin. Bilateral ACA branches are within normal limits. Left MCA M1 segment, bifurcation, and left MCA branches are within normal limits. Right MCA, M1 segment bifurcation, and right MCA branches are within normal limits. No right MCA branch occlusion identified. Venous sinuses: Arterial dominant phase imaging. Grossly patent major dural venous sinuses. Anatomic variants: None. Review of the MIP images confirms the above findings IMPRESSION: 1. 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 Patent right ICA terminus and right MCA. 2. Right MCA core infarct volume underestimated by CT Perfusion, suggesting subacute ischemic timing. 3. Moderate bilateral ICA siphon atherosclerosis associated with #1. But no carotid atherosclerosis or stenosis in the neck. And no posterior circulation atherosclerosis or stenosis. 4. Study was reviewed in person with Dr. Milon Dikes on 02/28/2017 beginning at 1000 hrs. Electronically Signed   By: Odessa Fleming M.D.   On: 02/28/2017 10:19   Mr Brain Wo Contrast  Result Date: 02/28/2017 CLINICAL DATA:  Altered mental status, TIA. LEFT-sided weakness, follow-up acute RIGHT MCA and PCA territory infarcts. EXAM: MRI HEAD WITHOUT CONTRAST  TECHNIQUE: Axial and coronal diffusion weighted imaging. Examination was terminated, per technologist note, patient began kicking and screaming. COMPARISON:  CT/CTA HEAD and neck February 28, 2017 FINDINGS: Confluent RIGHT frontal lobe reduced diffusion extending to the insula with low ADC values. Patchy discontinuous RIGHT frontal lobe reduced diffusion with normalized ADC values. Confluent RIGHT parietooccipital and subcentimeter RIGHT posterior temporal lobe foci of reduced diffusion with low ADC values. Regional mass effect without midline shift. No hydrocephalus. No abnormal extra-axial fluid collections. IMPRESSION: 1. Limited 2 sequence diffusion-weighted imaging examination. 2. Acute large RIGHT frontal/MCA and RIGHT posterior watershed territory infarcts. 3. Subacute small RIGHT frontal lobe/MCA infarct. Electronically Signed   By: Awilda Metro M.D.   On: 02/28/2017 22:28   Ct Cerebral Perfusion W Contrast  Result Date: 02/28/2017 CLINICAL DATA:  52 year old female, code stroke. Left side paralysis, unknown time of onset. EXAM: CT ANGIOGRAPHY HEAD AND NECK CT PERFUSION BRAIN TECHNIQUE: Multidetector CT imaging of the head and neck was performed using the standard protocol during bolus administration of intravenous contrast. Multiplanar CT image reconstructions and MIPs were obtained to evaluate the vascular anatomy. Carotid stenosis measurements (when applicable) are obtained utilizing NASCET criteria, using the distal internal carotid diameter as the denominator. Multiphase CT imaging of the brain was performed following IV bolus contrast injection. Subsequent parametric perfusion maps were calculated using RAPID software. CONTRAST:  90mL ISOVUE-370 IOPAMIDOL (ISOVUE-370) INJECTION 76% COMPARISON:  Head CT without contrast 0935 hr today. FINDINGS: CT Brain Perfusion Findings: CBF (<30%) Volume: 7 mL, but underestimated due to hypodense areas of core infarct. Perfusion (Tmax>6.0s) volume: 0  milliliters Mismatch Volume: Not applicable Infarction Location:Right hemisphere. CTA NECK Skeleton: No acute osseous abnormality identified. Upper chest: Mild gas  trapping in the upper lobes. No superior mediastinal lymphadenopathy. Other neck: Negative.  No cervical lymphadenopathy. Aortic arch: 3 vessel arch configuration. Mild motion artifact. No arch atherosclerosis or great vessel origin stenosis suspected. Right carotid system: Mild motion artifact at the right CCA origin without stenosis. No atherosclerosis identified in the cervical right carotid. Widely patent right ICA origin and bulb. Left carotid system: Mild motion artifact at the left CCA origin. Minimal if any atherosclerosis in the left cervical carotid. Widely patent left ICA origin and bulb. Vertebral arteries: No proximal right subclavian artery or right vertebral artery origin plaque or stenosis. Patent right vertebral artery to the skull base without stenosis. Mild motion artifact at the proximal subclavian artery. Normal left vertebral artery origin. Tortuous left V1 segment. Fairly codominant vertebral arteries in the neck. No plaque or stenosis in the left vertebral artery to the skull base. CTA HEAD Posterior circulation: Codominant distal vertebral arteries are patent to the vertebrobasilar junction without stenosis. Patent PICA origins. Patent basilar artery without stenosis. Normal SCA and PCA origins. Posterior communicating arteries are diminutive or absent. Bilateral PCA branches are symmetric and within normal limits. Anterior circulation: The left ICA siphon is patent with mild to moderate calcified plaque in the cavernous and supraclinoid ICA segments. Possible mild left supraclinoid segment stenosis. Patent left ICA terminus. Calcified plaque plus soft plaque or thrombus in the right ICA siphon. Short segment (3 millimeter) occlusion of the distal cavernous right ICA (series 11, image 91) with reconstituted flow at the right  anterior genu and patent right supraclinoid segment. Superimposed mild to moderate right siphon atherosclerotic stenosis. Patent right ICA terminus. Normal MCA origins. Questionable moderate stenosis of the right ACA origin. Bilateral ACA branches are within normal limits. Left MCA M1 segment, bifurcation, and left MCA branches are within normal limits. Right MCA, M1 segment bifurcation, and right MCA branches are within normal limits. No right MCA branch occlusion identified. Venous sinuses: Arterial dominant phase imaging. Grossly patent major dural venous sinuses. Anatomic variants: None. Review of the MIP images confirms the above findings IMPRESSION: 1. 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 Patent right ICA terminus and right MCA. 2. Right MCA core infarct volume underestimated by CT Perfusion, suggesting subacute ischemic timing. 3. Moderate bilateral ICA siphon atherosclerosis associated with #1. But no carotid atherosclerosis or stenosis in the neck. And no posterior circulation atherosclerosis or stenosis. 4. Study was reviewed in person with Dr. Milon Dikes on 02/28/2017 beginning at 1000 hrs. Electronically Signed   By: Odessa Fleming M.D.   On: 02/28/2017 10:19   Dg Chest Port 1 View  Result Date: 02/28/2017 CLINICAL DATA:  Headache, fever, shortness of breath. Mid chest pain. EXAM: PORTABLE CHEST 1 VIEW COMPARISON:  None FINDINGS: Cardiomegaly. Lungs are clear. No effusions. No acute bony abnormality. IMPRESSION: Cardiomegaly.  No active disease. Electronically Signed   By: Charlett Nose M.D.   On: 02/28/2017 10:37   Ct Head Code Stroke Wo Contrast  Result Date: 02/28/2017 CLINICAL DATA:  Code stroke. 52 year old female with left side paralysis. Unknown time of onset. EXAM: CT HEAD WITHOUT CONTRAST TECHNIQUE: Contiguous axial images were obtained from the base of the skull through the  vertex without intravenous contrast. COMPARISON:  None. FINDINGS: Brain: Multifocal confluent areas of gray and white matter hypodensity are scattered in the right hemisphere, with wedge-shaped peripheral areas of involvement in the right middle frontal  gyrus, right parietal lobe, and superior right occipital lobe. Additional smaller areas of involvement including the superior right peri-Rolandic cortex (series 3, image 25). No acute intracranial hemorrhage identified. No associated intracranial mass effect. Gray-white matter differentiation in the right temporal lobe and right basal ganglia is preserved. Left hemisphere, thalamic, and posterior fossa gray-white matter differentiation appears normal. No ventriculomegaly.  Normal basilar cisterns. Vascular: Calcified atherosclerosis at the skull base. No suspicious intracranial vascular hyperdensity. Skull: Negative. Sinuses/Orbits: Clear. Other: Visualized orbits and scalp soft tissues are within normal limits. ASPECTS [facility]([place] Stroke Program Early CT Score) - Ganglionic level infarction (caudate, lentiform nuclei, internal capsule, insula, M1-M3 cortex): 6 (abnormal M1 segment) - Supraganglionic infarction (M4-M6 cortex): 0 (M4 through and 6 abnormal). Total score (0-10 with 10 being normal): 6 IMPRESSION: 1. Multifocal subacute appearing cortical infarcts in the right hemisphere, primarily the right MCA territory although there is also superior right occipital lobe involvement. The right basal ganglia are spared. 2. No associated hemorrhage or intracranial mass effect. 3. ASPECTS is 6. 4. These results were communicated to Dr. Wilford CornerArora at 9:46 amon 1/23/2019by text page via the [facility]. Electronically Signed   By: Odessa FlemingH  Hall M.D.   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 Does the need for close, 24 hr/day  medical supervision in concert with the patient's rehab needs make it unreasonable for this patient to be served in a less intensive setting? Yes 2. Co-Morbidities requiring supervision/potential complications: Hypertension, tachycardia 3. Due to bladder management, bowel management, safety, skin/wound care, disease management, medication administration, pain management and patient education, does the patient require 24 hr/day rehab nursing? Yes 4. Does the patient require coordinated care of a physician, rehab nurse, PT (1-2 hrs/day, 5 days/week), OT (1-2 hrs/day, 5 days/week) and SLP (.5-1 hrs/day, 5 days/week) to address physical and functional deficits in the context of the above medical diagnosis(es)? Yes Addressing deficits in the following areas: balance, endurance, locomotion, strength, transferring, bowel/bladder control, bathing, dressing, feeding, grooming, toileting, cognition and psychosocial support 5. Can the patient actively participate in an intensive therapy program of at least 3 hrs of therapy per day at least 5 days per week? Yes 6. The potential for patient to make measurable gains while on inpatient rehab is good 7. Anticipated functional outcomes upon discharge from inpatient rehab are min assist  with PT, min assist with OT, min assist with SLP. 8. Estimated rehab length of stay to reach the above functional goals is: 19-21d 9. Anticipated D/C setting: Home 10. Anticipated post D/C treatments: HH therapy 11. Overall Rehab/Functional Prognosis: good  RECOMMENDATIONS: This patient's condition is appropriate for continued rehabilitative care in the following setting: CIR Patient has agreed to participate in recommended program. Yes Note that insurance prior authorization may be required for reimbursement for recommended care.  Comment:   Erick ColaceAndrew E. Kirsteins M.D. [facility] FAAPM&R (Sports Med, Neuromuscular Med) Diplomate Am Board of Electrodiagnostic  Med  Jacquelynn Creeamela S Love, PA-C 03/01/2017          Revision History                        Routing History

## 2017-03-05 NOTE — Progress Notes (Signed)
PT Cancellation Note  Patient Details Name: Patsy BaltimoreHwoc Speyer MRN: [id number] DOB: 03/12/1965   Cancelled Treatment:    Reason Eval/Treat Not Completed: Patient at procedure or test/unavailable Pt off floor at endo. Will follow up as time allows.   Blake DivineShauna A Jandy Brackens 03/05/2017, 12:18 PM Mylo RedShauna Mc Bloodworth, PT, DPT [phone]

## 2017-03-05 NOTE — CV Procedure (Signed)
TEE: 100 fentanyl 6 mg versed  Normal LV EF 65% PFO small with positive bubble Mild MR Normal RV Normal AV No aortic debris No effusion No LAA thrombus  Charlton HawsPeter Arora Coakley

## 2017-03-05 NOTE — PMR Pre-admission (Signed)
PMR Admission Coordinator Pre-Admission Assessment  Patient: Kristen Short is an 52 y.o., female MRN: [id number] DOB: 1965/03/23 Height:   Weight: 73.9 kg (163 lb)     Insurance Information HMO:      PPO: Yes     PCP:       IPA:       80/20:       OTHER:   PRIMARY: [employer]      Policy#: [id number]      Subscriber: Camila Li CM Name: [name]      Phone#: [phone]     Fax#: [phone] Pre-Cert#:  [id number]       Employer: [employer] Benefits:  Phone #: [phone]     Name: On line Eff. Date: 02/06/17     Deduct: $5000 (met $0)      Out of Pocket Max: [phone] (met $0)      Life Max: None CIR: 80% with $500 copay      SNF: 80% with 60 days max Outpatient: medical necessity     Co-Pay: $30/visit Home Health: 80%      Co-Pay: 20% DME: 80%     Co-Pay: 20% Providers: in network  Medicaid Application Date:        Case Manager:   Disability Application Date:        Case Worker:    Emergency Facilities manager Information    Name Relation Home Work Mobile   hwing,ysur Spouse   [phone]   Joaquim Lai Sister   [phone]     Current Medical History  Patient Admitting Diagnosis: R MCA infarct  History of Present Illness: A 52 y.o. female with no known prior medical history and  immigrated from [place] a year ago.  She was admitted on 02/28/17 with reports of HA since last Saturday followed by left sided weakness and difficulty walking.  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  2 D echo  Done today and work up underway. Patient with resultant dense left hemiparesis, left facial weakness, left inattention with right gaze preference, and  cognitive deficits. Therapy evaluations done revealing functional deficits and CIR recommended for follow up therapy. Sister in room helps to translate.  TEE done and Loop recorder placed 03/05/17.    Total: 12=NIH  Past Medical History  Past Medical History:  Diagnosis Date  . CVA (cerebral vascular accident) ([place])   . Itching    chronic issues  . Left wrist fracture     Family History  family history includes Heart Problems in her sister; High blood pressure in her sister.  Prior Rehab/Hospitalizations:  Has the patient had major surgery during 100 days prior to admission? No  Current Medications   Current Facility-Administered Medications:  .  [DISCONTINUED] acetaminophen (TYLENOL) tablet 650 mg, 650 mg, Oral, Q6H PRN, 650 mg at 03/01/17 2245 **OR** acetaminophen (TYLENOL) suppository 650 mg, 650 mg, Rectal, Q6H PRN, Verner Mould, MD, 650 mg at 03/01/17 0527 .  acetaminophen (TYLENOL) tablet 325-650 mg, 325-650 mg, Oral, Q4H PRN, Evans Lance, MD .  aspirin suppository 300 mg, 300 mg, Rectal, Daily **OR** aspirin tablet 325 mg, 325 mg, Oral, Daily, Verner Mould, MD, 325 mg at 03/04/17 0934 .  atorvastatin (LIPITOR) tablet 80 mg, 80 mg, Oral, q1800, Lancaster, Abigail Joseph, MD, 80 mg at 03/04/17 1818 .  camphor-menthol (SARNA) lotion, , Topical, TID, Riccio, Angela C, DO, 1 application at 03/04/17 2159 .  diphenhydrAMINE (BENADRYL) capsule 25 mg, 25 mg, Oral, Q8H PRN, Riccio, Angela C, DO, 25 mg at 03/05/17 1450 .  diphenhydrAMINE-zinc acetate (BENADRYL) 2-0.1 % cream, , Topical, BID PRN, Rumball, Alison, DO .  docusate sodium (COLACE) capsule 100 mg, 100 mg, Oral, BID, Wallace, Catherine Lauren, DO, 100 mg at 03/04/17 2154 .  enoxaparin (LOVENOX) injection 30 mg, 30 mg, Subcutaneous, Q24H, Lancaster, Abigail Joseph, MD, 30 mg at 03/04/17 1819 .  hydrocerin (EUCERIN) cream, , Topical, BID PRN, Rumball, Alison, DO .  insulin aspart (novoLOG) injection  0-9 Units, 0-9 Units, Subcutaneous, TID WC, Rumball, Alison, DO, 1 Units at 03/05/17 0830 .  ondansetron (ZOFRAN) injection 4 mg, 4 mg, Intravenous, Q6H PRN, Taylor, Gregg W, MD .  ondansetron (ZOFRAN) tablet 4 mg, 4 mg, Oral, Q6H PRN **OR** [DISCONTINUED] ondansetron (ZOFRAN) injection 4 mg, 4 mg, Intravenous, Q6H PRN, Lancaster, Abigail Joseph, MD .  polyethylene glycol (MIRALAX / GLYCOLAX) packet 17 g, 17 g, Oral, BID, Wallace, Catherine Lauren, DO, 17 g at 03/04/17 2153  Patients Current Diet: Fall precautions Diet Heart Room service appropriate? Yes; Fluid consistency: Thin  Precautions / Restrictions Precautions Precautions: Fall, Other (comment) Precaution Comments: No UE movement /moderate L LE weakness, L inattention, impulsive  Restrictions Weight Bearing Restrictions: No   Has the patient had 2 or more falls or a fall with injury in the past year?No.  Patient did fall 10 years ago and fracture her left wrist.  Also patient has fallen 1 time recently.    Prior Activity Level Community (5-7x/wk): Worked FT second shift 3:30 p to 10:30p.  Did not drive.  Home Assistive Devices / Equipment None  Prior Device Use: Indicate devices/aids used by the patient prior to current illness, exacerbation or injury? None  Prior Functional Level Prior Function Level of Independence: Independent  Self Care: Did the patient need help bathing, dressing, using the toilet or eating?  Independent  Indoor Mobility: Did the patient need assistance with walking from room to room (with or without device)? Independent  Stairs: Did the patient need assistance with internal or external stairs (with or without device)? Independent  Functional Cognition: Did the patient need help planning regular tasks such as shopping or remembering to take medications? Independent  Current Functional Level Cognition  Arousal/Alertness: Awake/alert Overall Cognitive Status: Impaired/Different from  baseline Current Attention Level: Sustained Orientation Level: Oriented X4 Safety/Judgement: Decreased awareness of safety, Decreased awareness of deficits General Comments: impulsive, patient reveals she is able to speak some English- enough to follow commands-, all other education provided through video interpreter today  Attention: Sustained Sustained Attention: Impaired Sustained Attention Impairment: Verbal complex, Functional complex Memory: Impaired Memory Impairment: Retrieval deficit, Decreased short term memory Decreased Short Term Memory: Verbal basic Awareness: Impaired Awareness Impairment: Intellectual impairment Problem Solving: Impaired Problem Solving Impairment: Verbal complex Executive Function: Reasoning, Decision Making Reasoning: Impaired Reasoning Impairment: Verbal complex, Functional complex Decision Making: Impaired Decision Making Impairment: Verbal complex, Functional complex Behaviors: Impulsive Safety/Judgment: Impaired    Extremity Assessment (includes Sensation/Coordination)  Upper Extremity Assessment: LUE deficits/detail LUE Deficits / Details: Flaccid, PROM WNL, pt reports feels numb, left inattention LUE Coordination: decreased fine motor, decreased gross motor  Lower Extremity Assessment: LLE deficits/detail LLE Deficits / Details: L LE strength approximately 3-/5  LLE Sensation:   decreased proprioception    ADLs  Overall ADL's : Needs assistance/impaired Eating/Feeding: Set up, Supervision/ safety Eating/Feeding Details (indicate cue type and reason): supported sitting Grooming: Moderate assistance Grooming Details (indicate cue type and reason): supported sitting Upper Body Bathing: Moderate assistance Upper Body Bathing Details (indicate cue type and reason): supported sitting Lower Body Bathing: Maximal assistance Lower Body Bathing Details (indicate cue type and reason): Mod +2 sit<>stand for safety Upper Body Dressing : Maximal  assistance, Sitting Upper Body Dressing Details (indicate cue type and reason): EOB without external support, cues for Pt to use RUE to assist LUE into hospital gown Lower Body Dressing: Maximal assistance, Sitting/lateral leans Lower Body Dressing Details (indicate cue type and reason): Pt able to don R sock without assist, OT initiated sock donning on L foot, Pt able to pull it on and up the rest of the way with RUE Toilet Transfer: +2 for safety/equipment, Moderate assistance, Stand-pivot, BSC Toilet Transfer Details (indicate cue type and reason): transferred to the right (strong side) Toileting- Clothing Manipulation and Hygiene: Moderate assistance Toileting - Clothing Manipulation Details (indicate cue type and reason): Mod +2 sit<>stand for safety General ADL Comments: Pt continues to be impulsive throughout session, eager to make progress and desire to get as much function back as possible comes back as impulsivity    Mobility  Overal bed mobility: Needs Assistance Bed Mobility: Supine to Sit Supine to sit: Mod assist, HOB elevated General bed mobility comments: to bring LEs around, bring trunk up at edge of bed    Transfers  Overall transfer level: Needs assistance Equipment used: 2 person hand held assist Transfers: Sit to/from Stand, Stand Pivot Transfers Sit to Stand: Mod assist, +2 safety/equipment Stand pivot transfers: Mod assist, +2 safety/equipment General transfer comment: +2 for safety, patient continues to be impulsive; able to shift weight briefly onto L LE to take steps with R     Ambulation / Gait / Stairs / Wheelchair Mobility  Ambulation/Gait General Gait Details: DNT today     Posture / Balance Dynamic Sitting Balance Sitting balance - Comments: forward lean today requiring min guard; Pt able to long sit at supervision level in bed today Balance Overall balance assessment: Needs assistance Sitting-balance support: Feet supported Sitting balance-Leahy Scale:  Fair Sitting balance - Comments: forward lean today requiring min guard; Pt able to long sit at supervision level in bed today Standing balance support: During functional activity, Bilateral upper extremity supported Standing balance-Leahy Scale: Poor Standing balance comment: reliant on assistance to maintain balance during transfer     Special needs/care consideration BiPAP/CPAP No CPM No Continuous Drip IV KVO Dialysis No        Life Vest No Oxygen No Special Bed No Trach Size no Wound Vac (area) No     Skin: Itchy skin                            Bowel mgmt: Last documented BM 03/01/17 Bladder mgmt:Voiding WDL Diabetic mgmt No     Previous Home Environment Living Arrangements: Spouse/significant other, Children  Lives With: Spouse Available Help at Discharge: Family Type of Home: Apartment Home Layout: One level Additional Comments: unsure, patient independent at baseline however   Discharge Living Setting Plans for Discharge Living Setting: House, Lives with (comment)(Plans to go home with sister and brother-in-law.) Type of Home at Discharge: House Discharge Home Layout: One level Discharge Home Access: Stairs to enter Entrance Stairs-Number of Steps: 4   steps Does the patient have any problems obtaining your medications?: No  Social/Family/Support Systems Patient Roles: Spouse, Parent, Other (Comment) Contact Information: Phillipe Hernandez - brother-in-law - [phone] Anticipated Caregiver: Hlir Chi - sister Anticipated Caregiver's Contact Information: Hlir Chi - sister - [phone] Ability/Limitations of Caregiver: Sister stays at home and can provide supervision and light assistance after rehab discharge Caregiver Availability: 24/7 Discharge Plan Discussed with Primary Caregiver: Yes(Talked with husband and sister.) Is Caregiver In Agreement with Plan?: Yes Does Caregiver/Family have Issues with Lodging/Transportation while Pt is in Rehab?: No(Patient lived  in [place].  Has family in [place].)  Goals/Additional Needs Patient/Family Goal for Rehab: PT/OT/SLP min assist goals Expected length of stay: 19-21 days Cultural Considerations: Speaks [place], is [place]. Dietary Needs: Dys 3, thin liquids Equipment Needs: TBD Additional Information: Lives in [place], has sister here in [place].  Has 4 children total.  Two are in [place] and the other 2 are here is [place].  One sone here is 24 and another child is ? 8th grade.  Will go home with sister here in [place].  Husband does not speak much English.  Patient has only been here about 1 year. Pt/Family Agrees to Admission and willing to participate: Yes Program Orientation Provided & Reviewed with Pt/Caregiver Including Roles  & Responsibilities: Yes  Decrease burden of Care through IP rehab admission: N/A  Possible need for SNF placement upon discharge: Not planned  Patient Condition: This patient's medical and functional status has changed since the consult dated: 03/01/17 in which the Rehabilitation Physician determined and documented that the patient's condition is appropriate for intensive rehabilitative care in an inpatient rehabilitation facility. See "History of Present Illness" (above) for medical update. Functional changes are: Currently requiring mod to max assist +2 for mobility and ADLs. Patient's medical and functional status update has been discussed with the Rehabilitation physician and patient remains appropriate for inpatient rehabilitation. Will admit to inpatient rehab today.  Preadmission Screen Completed By:  ,  M, 03/05/2017 3:47 PM ______________________________________________________________________   Discussed status with Dr. Patel on 03/05/17 at 1543 and received telephone approval for admission today.  Admission Coordinator:  ,  M, time 1547/Date 03/05/17    

## 2017-03-05 NOTE — Progress Notes (Signed)
Pt. Is alert and Oriented   C/o pain in her Left arm and Itching all over, not relieved by Any of the cream Orders.   MD informed of both complaints and spoke with the Patient.

## 2017-03-05 NOTE — Progress Notes (Signed)
SLP Cancellation Note  Patient Details Name: Kristen Short MRN: [id number] DOB: May 23, 1965   Cancelled treatment:       Reason Eval/Treat Not Completed: Patient at procedure or test/unavailable   Kristen Short, Kristen Short 03/05/2017, 12:13 PM

## 2017-03-05 NOTE — Progress Notes (Signed)
52 y.o. Female received from to Inpatient Rehab @ approximately 1815. Post (R) CVA. Patient's native language is [place]. Patient alert and oriented to self. (L) Sided Weakness. (L) Arm is flaccid. Skin assessment rendered  with Scratch marks noted to upper chest;  Scab to (R) Arm bend of elbow. Per nursing report Loop Recorded placed 03/05/2017. Minimal drainage around site. IV Site to (R) Hand. Resp. even and unlabored. No s/s distress noted. Spouse at bedside. Safety precautions initiated per unit protocol.

## 2017-03-05 NOTE — Interval H&P Note (Signed)
History and Physical Interval Note:  03/05/2017 12:29 PM  Kristen Short  has presented today for surgery, with the diagnosis of STROKE  The various methods of treatment have been discussed with the patient and family. After consideration of risks, benefits and other options for treatment, the patient has consented to  Procedure(s): TRANSESOPHAGEAL ECHOCARDIOGRAM (TEE) (N/A) as a surgical intervention .  The patient's history has been reviewed, patient examined, no change in status, stable for surgery.  I have reviewed the patient's chart and labs.  Questions were answered to the patient's satisfaction.     [place] HawsPeter Joakim Huesman

## 2017-03-05 NOTE — Progress Notes (Signed)
Kristen Diones, RN  Rehab Admission Coordinator  Physical Medicine and Rehabilitation  PMR Pre-admission  Cosign Needed  Date of Service:  03/05/2017 12:54 PM       Related encounter: ED to Hosp-Admission (Current) from 02/28/2017 in [place] Needed            '[]' Hide copied text  '[]' Hover for details   PMR Admission Coordinator Pre-Admission Assessment  Patient: Kristen Short is an 52 y.o., female MRN: [id number] DOB: [birth year] Height:   Weight: 73.9 kg (163 lb)                          Insurance Information HMO:      PPO: Yes     PCP:       IPA:       80/20:       OTHER:   PRIMARY: UHC      Policy#: [id number]      Subscriber: [place] Name: Kristen Short      Phone#: [phone]     Fax#: [phone] Pre-Cert#:  [id number]       Employer: [employer] Benefits:  Phone #: [phone]     Name: On line Eff. Date: 02/06/17     Deduct: $5000 (met $0)      Out of Pocket Max: [phone] (met $0)      Life Max: None CIR: 80% with $500 copay      SNF: 80% with 60 days max Outpatient: medical necessity     Co-Pay: $30/visit Home Health: 80%      Co-Pay: 20% DME: 80%     Co-Pay: 20% Providers: in network  Medicaid Application Date:        Case Manager:   Disability Application Date:        Case Worker:    Emergency Tax adviser Information    Name Relation Home Work Mobile   hwing,ysur Spouse   [phone]   Kristen Short Sister   [place] History  Patient Admitting Diagnosis: R MCA infarct  History of Present Illness: A 52 y.o.femalewith no known prior medical historyandimmigrated from [place] a year ago.She was admitted on 02/28/17 with reports of HA since last Saturday followed by left sided weakness and difficulty walking. CTA/P head and neck showed large vessel occlusion in right ICA siphon with short segment thrombosis with reconstitution, R-MCA subacute infarct and moderate  bilateral ICA siphon atherosclerosis and no carotid stenosis. 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 2 D echo Done today and work up underway. Patient with resultant dense left hemiparesis, left facial weakness, left inattention with right gaze preference, and cognitive deficits. Therapy evaluations done revealing functional deficits and CIR recommended for follow up therapy. Sister in room helps to translate.  TEE done and Loop recorder placed 03/05/17.    Total: 12=NIH  Past Medical History  Past Medical History:  Diagnosis Date  . CVA (cerebral vascular accident) ([place])   . Itching    chronic issues  . Left wrist fracture     Family History  family history includes Heart Problems in her sister; High blood pressure in her sister.  Prior Rehab/Hospitalizations:  Has the patient had major surgery during 100 days prior to admission? No  Current Medications   Current Facility-Administered Medications:  .  [DISCONTINUED] acetaminophen (TYLENOL) tablet 650 mg, 650 mg, Oral, Q6H PRN, 650 mg at 03/01/17 2245 **OR** acetaminophen (TYLENOL) suppository 650 mg, 650 mg, Rectal, Q6H PRN, Verner Mould, MD, 650 mg at 03/01/17 0527 .  acetaminophen (TYLENOL) tablet 325-650 mg, 325-650 mg, Oral, Q4H PRN, Evans Lance, MD .  aspirin suppository 300 mg, 300 mg, Rectal, Daily **OR** aspirin tablet 325 mg, 325 mg, Oral, Daily, Verner Mould, MD, 325 mg at 03/04/17 0934 .  atorvastatin (LIPITOR) tablet 80 mg, 80 mg, Oral, q1800, Verner Mould, MD, 80 mg at 03/04/17 1818 .  camphor-menthol (SARNA) lotion, , Topical, TID, Steve Rattler, DO, 1 application at 39/03/00 2159 .  diphenhydrAMINE (BENADRYL) capsule 25 mg, 25 mg, Oral, Q8H PRN, Riccio, Angela C, DO, 25 mg at 03/05/17 1450 .  diphenhydrAMINE-zinc acetate  (BENADRYL) 2-0.1 % cream, , Topical, BID PRN, Rumball, Alison, DO .  docusate sodium (COLACE) capsule 100 mg, 100 mg, Oral, BID, Nicolette Bang, DO, 100 mg at 03/04/17 2154 .  enoxaparin (LOVENOX) injection 30 mg, 30 mg, Subcutaneous, Q24H, Verner Mould, MD, 30 mg at 03/04/17 1819 .  hydrocerin (EUCERIN) cream, , Topical, BID PRN, Rory Percy, DO .  insulin aspart (novoLOG) injection 0-9 Units, 0-9 Units, Subcutaneous, TID WC, Rumball, Alison, DO, 1 Units at 03/05/17 0830 .  ondansetron (ZOFRAN) injection 4 mg, 4 mg, Intravenous, Q6H PRN, Evans Lance, MD .  ondansetron [facility]) tablet 4 mg, 4 mg, Oral, Q6H PRN **OR** [DISCONTINUED] ondansetron (ZOFRAN) injection 4 mg, 4 mg, Intravenous, Q6H PRN, Verner Mould, MD .  polyethylene glycol (MIRALAX / GLYCOLAX) packet 17 g, 17 g, Oral, BID, Nicolette Bang, DO, 17 g at 03/04/17 2153  Patients Current Diet: Fall precautions Diet Heart Room service appropriate? Yes; Fluid consistency: Thin  Precautions / Restrictions Precautions Precautions: Fall, Other (comment) Precaution Comments: No UE movement /moderate L LE weakness, L inattention, impulsive  Restrictions Weight Bearing Restrictions: No   Has the patient had 2 or more falls or a fall with injury in the past year?No.  Patient did fall 10 years ago and fracture her left wrist.  Also patient has fallen 1 time recently.    Prior Activity Level Community (5-7x/wk): Worked FT second shift 3:30 p to 10:30p.  Did not drive.  Home Assistive Devices / Equipment None  Prior Device Use: Indicate devices/aids used by the patient prior to current illness, exacerbation or injury? None  Prior Functional Level Prior Function Level of Independence: Independent  Self Care: Did the patient need help bathing, dressing, using the toilet or eating?  Independent  Indoor Mobility: Did the patient need assistance with walking from room to room  (with or without device)? Independent  Stairs: Did the patient need assistance with internal or external stairs (with or without device)? Independent  Functional Cognition: Did the patient need help planning regular tasks such as shopping or remembering to take medications? Independent  Current Functional Level Cognition  Arousal/Alertness: Awake/alert Overall Cognitive Status: Impaired/Different from baseline Current Attention Level: Sustained Orientation Level: Oriented X4 Safety/Judgement: Decreased awareness of safety, Decreased awareness of deficits General Comments: impulsive, patient reveals she is able to speak some English- enough to follow commands-, all other education provided through video interpreter today  Attention: Sustained Sustained Attention: Impaired Sustained Attention Impairment: Verbal complex, Functional complex Memory: Impaired Memory Impairment: Retrieval deficit, Decreased short term memory Decreased Short Term Memory: Verbal basic Awareness:  Impaired Awareness Impairment: Intellectual impairment Problem Solving: Impaired Problem Solving Impairment: Verbal complex Executive Function: Reasoning, Decision Making Reasoning: Impaired Reasoning Impairment: Verbal complex, Functional complex Decision Making: Impaired Decision Making Impairment: Verbal complex, Functional complex Behaviors: Impulsive Safety/Judgment: Impaired    Extremity Assessment (includes Sensation/Coordination)  Upper Extremity Assessment: LUE deficits/detail LUE Deficits / Details: Flaccid, PROM WNL, pt reports feels numb, left inattention LUE Coordination: decreased fine motor, decreased gross motor  Lower Extremity Assessment: LLE deficits/detail LLE Deficits / Details: L LE strength approximately 3-/5  LLE Sensation: decreased proprioception    ADLs  Overall ADL's : Needs assistance/impaired Eating/Feeding: Set up, Supervision/ safety Eating/Feeding Details (indicate  cue type and reason): supported sitting Grooming: Moderate assistance Grooming Details (indicate cue type and reason): supported sitting Upper Body Bathing: Moderate assistance Upper Body Bathing Details (indicate cue type and reason): supported sitting Lower Body Bathing: Maximal assistance Lower Body Bathing Details (indicate cue type and reason): Mod +2 sit<>stand for safety Upper Body Dressing : Maximal assistance, Sitting Upper Body Dressing Details (indicate cue type and reason): EOB without external support, cues for Pt to use RUE to assist LUE into hospital gown Lower Body Dressing: Maximal assistance, Sitting/lateral leans Lower Body Dressing Details (indicate cue type and reason): Pt able to don R sock without assist, OT initiated sock donning on L foot, Pt able to pull it on and up the rest of the way with RUE Toilet Transfer: +2 for safety/equipment, Moderate assistance, Stand-pivot, BSC Toilet Transfer Details (indicate cue type and reason): transferred to the right (strong side) Toileting- Clothing Manipulation and Hygiene: Moderate assistance Toileting - Clothing Manipulation Details (indicate cue type and reason): Mod +2 sit<>stand for safety General ADL Comments: Pt continues to be impulsive throughout session, eager to make progress and desire to get as much function back as possible comes back as impulsivity    Mobility  Overal bed mobility: Needs Assistance Bed Mobility: Supine to Sit Supine to sit: Mod assist, HOB elevated General bed mobility comments: to bring LEs around, bring trunk up at edge of bed    Transfers  Overall transfer level: Needs assistance Equipment used: 2 person hand held assist Transfers: Sit to/from Stand, Stand Pivot Transfers Sit to Stand: Mod assist, +2 safety/equipment Stand pivot transfers: Mod assist, +2 safety/equipment General transfer comment: +2 for safety, patient continues to be impulsive; able to shift weight briefly onto L LE  to take steps with R     Ambulation / Gait / Stairs / Wheelchair Mobility  Ambulation/Gait General Gait Details: DNT today     Posture / Balance Dynamic Sitting Balance Sitting balance - Comments: forward lean today requiring min guard; Pt able to long sit at supervision level in bed today Balance Overall balance assessment: Needs assistance Sitting-balance support: Feet supported Sitting balance-Leahy Scale: Fair Sitting balance - Comments: forward lean today requiring min guard; Pt able to long sit at supervision level in bed today Standing balance support: During functional activity, Bilateral upper extremity supported Standing balance-Leahy Scale: Poor Standing balance comment: reliant on assistance to maintain balance during transfer     Special needs/care consideration BiPAP/CPAP No CPM No Continuous Drip IV KVO Dialysis No        Life Vest No Oxygen No Special Bed No Trach Size no Wound Vac (area) No     Skin: Itchy skin  Bowel mgmt: Last documented BM 03/01/17 Bladder mgmt:Voiding WDL Diabetic mgmt No     Previous Home Environment Living Arrangements: Spouse/significant other, Children  Lives With: Spouse Available Help at Discharge: Family Type of Home: Apartment Home Layout: One level Additional Comments: unsure, patient independent at baseline however   Discharge Living Setting Plans for Discharge Living Setting: House, Lives with (comment)(Plans to go home with sister and brother-in-law.) Type of Home at Discharge: House Discharge Home Layout: One level Discharge Home Access: Stairs to enter Entrance Stairs-Number of Steps: 4 steps Does the patient have any problems obtaining your medications?: No  Social/Family/Support Systems Patient Roles: Spouse, Parent, Other (Comment) Contact Information: Alphia Kava - brother-in-law - [phone] Anticipated Caregiver: Hlir Chi - sister Anticipated Caregiver's Contact  Information: Hlir Chi - sister - [phone] Ability/Limitations of Caregiver: Sister stays at home and can provide supervision and light assistance after rehab discharge Caregiver Availability: 24/7 Discharge Plan Discussed with Primary Caregiver: Yes(Talked with husband and sister.) Is Caregiver In Agreement with Plan?: Yes Does Caregiver/Family have Issues with Lodging/Transportation while Pt is in Rehab?: No(Patient lived in [place].  Has family in [place].)  Goals/Additional Needs Patient/Family Goal for Rehab: PT/OT/SLP min assist goals Expected length of stay: 19-21 days Cultural Considerations: Speaks [place], is [place]. Dietary Needs: Dys 3, thin liquids Equipment Needs: TBD Additional Information: Lives in [place], has sister here in [place].  Has 4 children total.  Two are in [place] and the other 2 are here is [place].  One sone here is 24 and another child is ? 8th grade.  Will go home with sister here in [place].  Husband does not speak much [place].  Patient has only been here about 1 year. Pt/Family Agrees to Admission and willing to participate: Yes Program Orientation Provided & Reviewed with Pt/Caregiver Including Roles  & Responsibilities: Yes  Decrease burden of Care through IP rehab admission: N/A  Possible need for SNF placement upon discharge: Not planned  Patient Condition: This patient's medical and functional status has changed since the consult dated: 03/01/17 in which the Rehabilitation Physician determined and documented that the patient's condition is appropriate for intensive rehabilitative care in an inpatient rehabilitation facility. See "History of Present Illness" (above) for medical update. Functional changes are: Currently requiring mod to max assist +2 for mobility and ADLs. Patient's medical and functional status update has been discussed with the Rehabilitation physician and patient remains appropriate for inpatient rehabilitation. Will  admit to inpatient rehab today.  Preadmission Screen Completed By:  Kristen Short, 03/05/2017 3:47 PM ______________________________________________________________________   Discussed status with Dr. Posey Pronto on 03/05/17 at 1543 and received telephone approval for admission today.  Admission Coordinator:  Kristen Short, time 1547/Date 03/05/17

## 2017-03-05 NOTE — Progress Notes (Signed)
Endoscopy pre procedure completed using stratus interpreters Lyna [phone]#[id number].

## 2017-03-06 ENCOUNTER — Inpatient Hospital Stay (HOSPITAL_COMMUNITY): Payer: 59

## 2017-03-06 ENCOUNTER — Inpatient Hospital Stay (HOSPITAL_COMMUNITY): Payer: 59 | Admitting: Occupational Therapy

## 2017-03-06 ENCOUNTER — Inpatient Hospital Stay (HOSPITAL_COMMUNITY): Payer: 59 | Admitting: Speech Pathology

## 2017-03-06 DIAGNOSIS — R414 Neurologic neglect syndrome: Secondary | ICD-10-CM

## 2017-03-06 DIAGNOSIS — M7989 Other specified soft tissue disorders: Secondary | ICD-10-CM

## 2017-03-06 DIAGNOSIS — I63511 Cerebral infarction due to unspecified occlusion or stenosis of right middle cerebral artery: Secondary | ICD-10-CM

## 2017-03-06 DIAGNOSIS — G8194 Hemiplegia, unspecified affecting left nondominant side: Secondary | ICD-10-CM

## 2017-03-06 LAB — CBC WITH DIFFERENTIAL/PLATELET
BASOS ABS: 0 10*3/uL (ref 0.0–0.1)
BASOS PCT: 0 %
EOS PCT: 4 %
Eosinophils Absolute: 0.3 10*3/uL (ref 0.0–0.7)
HCT: 48.3 % — ABNORMAL HIGH (ref 36.0–46.0)
Hemoglobin: 15.5 g/dL — ABNORMAL HIGH (ref 12.0–15.0)
Lymphocytes Relative: 25 %
Lymphs Abs: 1.8 10*3/uL (ref 0.7–4.0)
MCH: 25 pg — ABNORMAL LOW (ref 26.0–34.0)
MCHC: 32.1 g/dL (ref 30.0–36.0)
MCV: 77.8 fL — ABNORMAL LOW (ref 78.0–100.0)
MONO ABS: 0.4 10*3/uL (ref 0.1–1.0)
Monocytes Relative: 6 %
Neutro Abs: 4.8 10*3/uL (ref 1.7–7.7)
Neutrophils Relative %: 65 %
PLATELETS: 169 10*3/uL (ref 150–400)
RBC: 6.21 MIL/uL — AB (ref 3.87–5.11)
RDW: 14.6 % (ref 11.5–15.5)
WBC: 7.4 10*3/uL (ref 4.0–10.5)

## 2017-03-06 LAB — COMPREHENSIVE METABOLIC PANEL
ALBUMIN: 3.5 g/dL (ref 3.5–5.0)
ALT: 19 U/L (ref 14–54)
AST: 27 U/L (ref 15–41)
Alkaline Phosphatase: 69 U/L (ref 38–126)
Anion gap: 11 (ref 5–15)
BUN: 25 mg/dL — AB (ref 6–20)
CHLORIDE: 107 mmol/L (ref 101–111)
CO2: 22 mmol/L (ref 22–32)
CREATININE: 1.14 mg/dL — AB (ref 0.44–1.00)
Calcium: 9.3 mg/dL (ref 8.9–10.3)
GFR calc Af Amer: >60 mL/min (ref >60)
GFR calc non Af Amer: 54 mL/min — ABNORMAL LOW (ref >60)
GLUCOSE: 152 mg/dL — AB (ref 65–99)
Potassium: 4.2 mmol/L (ref 3.5–5.1)
SODIUM: 140 mmol/L (ref 135–145)
Total Bilirubin: 0.5 mg/dL (ref 0.3–1.2)
Total Protein: 7.7 g/dL (ref 6.5–8.1)

## 2017-03-06 NOTE — Progress Notes (Signed)
Social Work  Social Work Assessment and Plan  Patient Details  Name: Kristen Short MRN: [id number] Date of Birth: September 20, 1965  Today's Date: 03/06/2017  Problem List:  Patient Active Problem List   Diagnosis Date Noted  . Acute ischemic right MCA stroke (HCC) 03/05/2017  . Cognitive deficit, post-stroke   . Itching   . Diabetes mellitus, new onset (HCC)   . AKI (acute kidney injury) (HCC)   . Slow transit constipation   . Left hemiplegia (HCC)   . Type 2 diabetes mellitus without complication, without long-term current use of insulin (HCC)   . Hyperlipidemia   . CVA (cerebral vascular accident) (HCC) 02/28/2017   Past Medical History:  Past Medical History:  Diagnosis Date  . CVA (cerebral vascular accident) (HCC)   . Itching    chronic issues  . Left wrist fracture    Past Surgical History:  Past Surgical History:  Procedure Laterality Date  . LOOP RECORDER INSERTION N/A 03/05/2017   Procedure: LOOP RECORDER INSERTION;  Surgeon: Marinus Mawaylor, Gregg W, MD;  [facility];  Service: Cardiovascular;  Laterality: N/A;   Social History:  has no tobacco, alcohol, and drug history on file.  Family / Support Systems Marital Status: Married Patient Roles: Spouse, Parent, Other (Comment)(employee) Spouse/Significant Other: [name]-[phone] speaks very little English Children: 2 here and 2 in [place] Other Supports: Hlir Chi-sister [phone]  Phillip-brother in-law [phone] speaks English Anticipated Caregiver: Sister-Hlir Ability/Limitations of Caregiver: Sister is not employed and can provide care to pt-pt's husband and brother in-law work Engineer, structuralCaregiver Availability: 24/7 Family Dynamics: Close knit family who will pull together when there is diffculty. Sister and brother in-law sponsored them to come here from [place]. They were doing well until this happened. All plan to help pt and get her through this.  Social History Preferred language: Sports administratorMontagnard  [place] Religion:  Cultural Background: Only been here for one year from [place] Education: Some education in [place] Read: Yes(native language) Write: Yes(native language) Employment Status: Employed Length of Employment: 1 Return to Work Plans: probably not due to deficits from her stroke Fish farm managerLegal Hisotry/Current Legal Issues: been here for one year Guardian/Conservator: none-according to MD pt is not fully capable of making her own decisions while here. Will look toward her husband since he is next of kin, but will need interpreter if any decisions need to be made while here.   Abuse/Neglect Abuse/Neglect Assessment Can Be Completed: Yes Physical Abuse: Denies Verbal Abuse: Denies Sexual Abuse: Denies Exploitation of patient/patient's resources: Denies Self-Neglect: Denies  Emotional Status Pt's affect, behavior adn adjustment status: Pt is motivated to do well here. She is having a reaction to her medications and is itching all over, nurse is aware and has lotion for her. Husband is here to observe pt in therapies today and provide support. Disucssed having him work and get off once she is closer to going home. Recent Psychosocial Issues: recent move here and health issues-not aware she had HTN Pyschiatric History: No history deferred depression screening due to adjusting to the new unit and learning the process here. Would benefit from seeing neuro-psych while here due to young age and all that she has been through in a short period of time. Will ask team when appropriate to see and arrange interpreter Substance Abuse History: No issues  Patient / Family Perceptions, Expectations & Goals Pt/Family understanding of illness & functional limitations: Pt and husband have a basic understanding of her stroke. Aware she can not move her left side  but still learning and pt is trying to move and try to will herself back. Much education is needed and will ask PA and RN while interpreter here to  explain condition. Premorbid pt/family roles/activities: Wife, Mom, sibling, employee, friend, etc Anticipated changes in roles/activities/participation: resume Pt/family expectations/goals: Pt states: " I need to quit itching I can't focus. But I need to get better soon."  Husband states: " I hope she gets well here."  [employer]: None Premorbid Home Care/DME Agencies: None Transportation available at discharge: Family members Resource referrals recommended: Neuropsychology, Support group (specify)  Discharge Planning Living Arrangements: Spouse/significant other, Children Support Systems: Spouse/significant other, Children, Other relatives, Friends/neighbors, Psychologist, clinical community Type of Residence: Private residence Insurance Resources: Media planner (specify)(UHC) Financial Screen Referred: Yes Living Expenses: Rent Money Management: Spouse Does the patient have any problems obtaining your medications?: Yes (Describe)(Doesn't have a PCP here, will need one at discharge) Home Management: Self and family Patient/Family Preliminary Plans: Plans to go to sister and brother in-law's home here where sister is not working and can provide assist. Informed she will require 24 hr care upon discharge from here. Discussed whoever will be providing care will need to come in and learn her care prior to discharge. Pt is destracted by her itching and hopefully once resolved will be able to focus on therapies and progress. [place] Barriers to Discharge: Other (comments)(language barrier due to status not eligible for services-disability and medicaid) Sw Barriers to Discharge Comments: has not been here long enough to be eligible for disability and medicaid Social Work Anticipated Follow Up Needs: HH/OP, Support Group  Clinical Impression Pleasant distracted female who is focused on her itching. Her husband is here and watching her in therapies and providing support. Obtained  information with interpreter present and will continue to talk with family members. Will see if eligible for any services but do not feel she has been in [place] long enough to be eligible for disability and medicaid. Will work with on discharge needs and obtaining a PCP to follow upon discharge. See when appropriate for neuro-psych to see.  Lucy Chris 03/06/2017, 1:22 PM
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
## 2017-03-06 NOTE — Progress Notes (Signed)
Occupational Therapy Assessment and Plan  Patient Details  Name: Kristen Short MRN: [id number] Date of Birth: 12-Sep-1965  OT Diagnosis: abnormal posture, cognitive deficits, hemiplegia affecting non-dominant side and muscle weakness (generalized) Rehab Potential: Rehab Potential (ACUTE ONLY): Excellent ELOS: 14-18 days   Today's Date: 03/06/2017 OT Individual Time: 9476-5465 OT Individual Time Calculation (min): 73 min     Problem List:  Patient Active Problem List   Diagnosis Date Noted  . Acute ischemic right MCA stroke ([place]) 03/05/2017  . Cognitive deficit, post-stroke   . Itching   . Diabetes mellitus, new onset ([place])   . AKI (acute kidney injury) ([place])   . Slow transit constipation   . Left hemiplegia ([place])   . Type 2 diabetes mellitus without complication, without long-term current use of insulin ([place])   . Hyperlipidemia   . CVA (cerebral vascular accident) ([place]) 02/28/2017    Past Medical History:  Past Medical History:  Diagnosis Date  . CVA (cerebral vascular accident) ([place])   . Itching    chronic issues  . Left wrist fracture    Past Surgical History:  Past Surgical History:  Procedure Laterality Date  . LOOP RECORDER INSERTION N/A 03/05/2017   Procedure: LOOP RECORDER INSERTION;  Surgeon: Evans Lance, MD;  Location: [facility];  Service: Cardiovascular;  Laterality: N/A;    Assessment & Plan Clinical Impression: Patient is a 52 y.o. year old female with recent admission to the hospital on 02/28/17 with reports of HA since last Saturday followed by left sided weakness and difficulty walking.  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  MRI brain reviewed, showing right MCA.  Per report, acute large right frontal MCA and right posterior watershed territory infarcts and subacute small  right frontal lobe/MCA infarct with regional mass effect. Patient transferred to CIR on 03/05/2017 .    Patient currently requires max with basic self-care skills secondary to muscle weakness, abnormal tone, unbalanced muscle activation and decreased motor planning, decreased attention to left and decreased motor planning, decreased initiation, decreased attention, decreased awareness, decreased problem solving, decreased safety awareness, decreased memory and delayed processing and decreased sitting balance, decreased standing balance, decreased postural control, hemiplegia and decreased balance strategies.  Prior to hospitalization, patient could complete BADL with independent .  Patient will benefit from skilled intervention to increase independence with basic self-care skills prior to discharge home with care partner.  Anticipate patient will require 24 hour supervision and follow up home health.  OT - End of Session Endurance Deficit: Yes Endurance Deficit Description: Rest breaks required within BADLs OT Assessment Rehab Potential (ACUTE ONLY): Excellent OT Patient demonstrates impairments in the following area(s): Balance;Cognition;Endurance;Motor;Pain;Perception;Safety;Sensory OT Basic ADL's Functional Problem(s): Grooming;Eating;Bathing;Toileting;Dressing OT Transfers Functional Problem(s): Toilet;Tub/Shower OT Additional Impairment(s): Fuctional Use of Upper Extremity OT Plan OT Intensity: Minimum of 1-2 x/day, 45 to 90 minutes OT Frequency: 5 out of 7 days OT Duration/Estimated Length of Stay: 14-18 days OT Treatment/Interventions: Balance/vestibular training;Cognitive remediation/compensation;Community reintegration;Discharge planning;Disease mangement/prevention;DME/adaptive equipment instruction;Functional electrical stimulation;Functional mobility training;Neuromuscular re-education;Pain management;Patient/family education;Self Care/advanced ADL retraining;Skin care/wound  managment;Therapeutic Activities;Splinting/orthotics;Therapeutic Exercise;UE/LE Strength taining/ROM;UE/LE Coordination activities;Wheelchair propulsion/positioning;Visual/perceptual remediation/compensation OT Self Feeding Anticipated Outcome(s): Supervision  OT Basic Self-Care Anticipated Outcome(s): Supervision OT Toileting Anticipated Outcome(s): Supervision OT Bathroom Transfers Anticipated Outcome(s): Supervision OT Recommendation Patient destination: Home Follow Up Recommendations: Home health OT Equipment Recommended: To be determined  Skilled Therapeutic Intervention Initial eval completed with treatment provided to address functional transfers, functional use of L UE, improved sit<>stand, standing tolerance, and adapted bathing/dressing skills. Pt came to sitting EOB with Max A Stand-pivot transfers bed>wc>shower chair with overall Mod/max  A. Incorporated L NMR weight bearing techniques within bathing tasks with hand over hand A to grasp wash cloth. Mod A for balance when reaching to wash buttocks. LB/UB dressing completed wc level at the sink with assistance to thread L LE into clothing, cues for sequencing, and assist to thread L LE and pull up pants. UB dressing with mod A overall 2/2 R hemiplegia. Incorporate weight bearing through L UE when standing at the sink. Assisted with hair brushing task 2/2 fatigue. Pt transferred back to bed at end of session with mod A to stand pivot. Pt left with bed alarm on and needs met.   OT Evaluation Precautions/Restrictions  Precautions Precautions: Fall;Other (comment) Precaution Comments: L hemiplegia; decreased L attention Restrictions Weight Bearing Restrictions: No Home Living/Prior Functioning Home Living Family/patient expects to be discharged to:: Private residence Living Arrangements: Spouse/significant other, Children Available Help at Discharge: Family Type of Home: Apartment Home Layout: One level Additional Comments: unable  to confirm information without interpreter present  Lives With: Spouse IADL History Type of Occupation: Works at a [place]? IADL Comments: Enjoys spending time with family- has 4 children ages 12-24 Prior Function Level of Independence: Independent with basic ADLs, Independent with homemaking with ambulation ADL ADL ADL Comments: Please see functional navigator Vision Baseline Vision/History: No visual deficits Vision Assessment?: Yes Eye Alignment: Within Functional Limits Alignment/Gaze Preference: Gaze right Tracking/Visual Pursuits: Decreased smoothness of eye movement to LEFT superior field;Decreased smoothness of eye movement to LEFT inferior field;Impaired - to be further tested in functional context;Other (comment)(Pt is able to track past midline) Perception  Perception: Impaired Inattention/Neglect: Does not attend to left visual field;Does not attend to left side of body Cognition Overall Cognitive Status: Impaired/Different from baseline Arousal/Alertness: Awake/alert Orientation Level: Person;Place;Situation Person: Oriented Place: Oriented Situation: Oriented Year: 2019 Month: January Day of Week: Incorrect Memory: Impaired Memory Impairment: Retrieval deficit;Decreased short term memory Decreased Short Term Memory: Verbal basic Immediate Memory Recall: Blue;Sock;Bed Memory Recall: Blue Memory Recall Blue: Without Cue Attention: Sustained Sustained Attention: Impaired Sustained Attention Impairment: Verbal complex;Functional complex Awareness: Impaired Awareness Impairment: Intellectual impairment Problem Solving: Impaired Problem Solving Impairment: Verbal complex;Functional complex Behaviors: Impulsive Safety/Judgment: Impaired Comments:  impulsive and demonstrates decreased overall safety awareness Sensation Sensation Light Touch: Impaired Detail Light Touch Impaired Details: Impaired LUE Proprioception: Impaired Detail Proprioception Impaired  Details: Impaired LUE;Impaired LLE Coordination Gross Motor Movements are Fluid and Coordinated: No Fine Motor Movements are Fluid and Coordinated: No Motor  Motor Motor: Hemiplegia;Abnormal tone;Abnormal postural alignment and control Mobility  Bed Mobility Bed Mobility: Right Sidelying to Sit;Sitting - Scoot to Edge of Bed Right Sidelying to Sit: 2: Max assist;3: Mod assist Sitting - Scoot to Edge of Bed: 3: Mod assist Transfers Sit to Stand: 3: Mod assist  Trunk/Postural Assessment  Postural Control Postural Control: Deficits on evaluation Trunk Control: impaired Protective Responses: decreased to L   Balance Balance Balance Assessed: Yes Static Sitting Balance Static Sitting - Level of Assistance: 4: Min assist;5: Stand by assistance Dynamic Sitting Balance Dynamic Sitting - Level of Assistance: 4: Min assist;3: Mod assist Static Standing Balance Static Standing - Balance Support: During functional activity Static Standing - Level of Assistance: 3: Mod assist;4: Min assist Dynamic Standing Balance Dynamic Standing - Balance Support: During  functional activity Dynamic Standing - Level of Assistance: 3: Mod assist Extremity/Trunk Assessment RUE Assessment RUE Assessment: Within Functional Limits LUE Assessment LUE Assessment: Exceptions to WFL LUE Tone LUE Tone: Modified Ashworth Modified Ashworth Scale for Grading Hypertonia LUE: Slight increase in muscle tone, manifested by a catch and release or by minimal resistance at the end of the range of motion when the affected part(s) is moved in flexion or extension LUE Tone Comments: Brunnstrom level II   See Function Navigator for Current Functional Status.   Refer to Care Plan for Long Term Goals  Recommendations for other services: None    Discharge Criteria: Patient will be discharged from OT if patient refuses treatment 3 consecutive times without medical reason, if treatment goals not met, if there is a change in  medical status, if patient makes no progress towards goals or if patient is discharged from hospital.  The above assessment, treatment plan, treatment alternatives and goals were discussed and mutually agreed upon: by patient and by family  Valma Cava 03/06/2017, 8:18 PM

## 2017-03-06 NOTE — Care Management Note (Signed)
[facility]patient [facility] Individual Statement of Services  Patient Name:  Kristen Short  Date:  03/06/2017  Welcome to the [facility].  Our goal is to provide you with an individualized program based on your diagnosis and situation, designed to meet your specific needs.  With this comprehensive rehabilitation program, you will be expected to participate in at least 3 hours of rehabilitation therapies Monday-Friday, with modified therapy programming on the weekends.  Your rehabilitation program will include the following services:  Physical Therapy (PT), Occupational Therapy (OT), Speech Therapy (ST), 24 hour per day rehabilitation nursing, Neuropsychology, Case Management (Social Worker), Rehabilitation Medicine, Nutrition Services and Pharmacy Services  Weekly team conferences will be held on Wednesday to discuss your progress.  Your Social Worker will talk with you frequently to get your input and to update you on team discussions.  Team conferences with you and your family in attendance may also be held.  Expected length of stay: 14-18 days  Overall anticipated outcome: supervision level with cueing  Depending on your progress and recovery, your program may change. Your Social Worker will coordinate services and will keep you informed of any changes. Your Social Worker's name and contact numbers are listed  below.  The following services may also be recommended but are not provided by the Inpatient [facility]:    Home Health Rehabiltiation Services  Outpatient Rehabilitation Services  Vocational Rehabilitation   Arrangements will be made to provide these services after discharge if needed.  Arrangements include referral to agencies that provide these services.  Your insurance has been verified to be:  [facility] [employer] Your primary doctor is:  None  Pertinent information will be shared with your doctor and your insurance company.  Social Worker:  Dossie DerBecky Giovan Pinsky,  SW [phone] or ([phone]  Information discussed with and copy given to patient by: Lucy Chrisupree, Beila Purdie G, 03/06/2017, 11:10 AM

## 2017-03-06 NOTE — Evaluation (Signed)
Physical Therapy Assessment and Plan  Patient Details  Name: Kristen Short MRN: [id number] Date of Birth: 09-19-1965  PT Diagnosis: Difficulty walking, Edema, Hemiplegia non-dominant, Hypotonia, Impaired cognition, Impaired sensation, Muscle weakness and Pain in joint Rehab Potential: Good ELOS: 14-18 days   Today's Date: 03/06/2017 PT Individual Time: 0940-1040 PT Individual Time Calculation (min): 60 min    Problem List:  Patient Active Problem List   Diagnosis Date Noted  . Acute ischemic right MCA stroke ([place]) 03/05/2017  . Cognitive deficit, post-stroke   . Itching   . Diabetes mellitus, new onset ([place])   . AKI (acute kidney injury) ([place])   . Slow transit constipation   . Left hemiplegia ([place])   . Type 2 diabetes mellitus without complication, without long-term current use of insulin ([place])   . Hyperlipidemia   . CVA (cerebral vascular accident) ([place]) 02/28/2017    Past Medical History:  Past Medical History:  Diagnosis Date  . CVA (cerebral vascular accident) ([employer])   . Itching    chronic issues  . Left wrist fracture    Past Surgical History:  Past Surgical History:  Procedure Laterality Date  . LOOP RECORDER INSERTION N/A 03/05/2017   Procedure: LOOP RECORDER INSERTION;  Surgeon: Evans Lance, MD;  Location: [facility];  Service: Cardiovascular;  Laterality: N/A;    Assessment & Plan Clinical Impression: Patient is a 52 y.o. year old female with recent admission to the hospital with no known prior medical historyandimmigrated from [place] a year ago.History taken from chart review. She was admitted on 02/28/17 with reports of HA since last Saturday followed by left sided weakness and difficulty walking. 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 MRI brain reviewed, showing right MCA.   Per report, acute large right frontal MCA and right posterior watershed territory infarcts and subacute small right frontal lobe/MCA infarct with regional mass effect. 2 D echo revealed EF 55-60% with no wall abnormality.   Dr. Leonie Man recommended full workup to rule out embolic source. TEE done today and no thrombus or aortic debris but small PFO with positive bubble study. Loop recorder placed.  Patient with resultant dense left hemiparesis, left facial weakness, left inattention with right gaze preference, diplopia with question of Left HH as well as impulsivity with cognitive deficits. CIR recommended for follow up therapy.  Patient transferred to CIR on 03/05/2017 .   Patient currently requires mod to max assist with mobility secondary to muscle weakness, muscle joint tightness and muscle paralysis, decreased cardiorespiratoy endurance, impaired timing and sequencing, abnormal tone and decreased coordination, decreased midline orientation and decreased attention to left, decreased attention, decreased awareness, decreased problem solving, decreased safety awareness and decreased memory and decreased sitting balance, decreased standing balance, decreased postural control, hemiplegia and decreased balance strategies.  Prior to hospitalization, patient was independent  with mobility and lived with Spouse in a [place] home.  Home access still needs to be assessed as unable to confirm details due to language barrier. Information in chart states 4 steps to enter home with plan to d/c to brother/sister-inlaws home.   Patient will benefit from skilled PT intervention to maximize safe functional mobility, minimize fall risk and decrease caregiver burden for planned discharge home with 24 hour supervision.  Anticipate patient will benefit from follow up [facility] at discharge.  PT - End of Session Activity Tolerance:  Decreased this session Endurance Deficit: Yes PT Assessment Rehab Potential (ACUTE/IP ONLY):  Good PT Barriers to Discharge: Other (comments) PT Barriers to Discharge Comments: need to confirm availability of caregiver; recommending 24/7 supervision PT Patient demonstrates impairments in the following area(s): Balance;Behavior;Edema;Endurance;Motor;Pain;Perception;Safety;Sensory;Skin Integrity PT Transfers Functional Problem(s): Bed Mobility;Bed to Chair;Car;Furniture PT Locomotion Functional Problem(s): Ambulation;Wheelchair Mobility;Stairs PT Plan PT Intensity: Minimum of 1-2 x/day ,45 to 90 minutes PT Frequency: 5 out of 7 days PT Duration Estimated Length of Stay: 14-18 days PT Treatment/Interventions: Ambulation/gait training;Balance/vestibular training;Cognitive remediation/compensation;Community reintegration;Discharge planning;Disease management/prevention;DME/adaptive equipment instruction;Functional mobility training;Neuromuscular re-education;Functional electrical stimulation;Pain management;Patient/family education;Psychosocial support;Skin care/wound management;Splinting/orthotics;Stair training;Therapeutic Activities;Therapeutic Exercise;UE/LE Strength taining/ROM;UE/LE Coordination activities;Visual/perceptual remediation/compensation;Wheelchair propulsion/positioning PT Transfers Anticipated Outcome(s): supervision basic transfers; min assist car PT Locomotion Anticipated Outcome(s): supervision gait controlled environment, min gait in home environment and min stairs PT Recommendation Recommendations for Other Services: Neuropsych consult Follow Up Recommendations: Home health PT;24 hour supervision/assistance Patient destination: Home Equipment Recommended: To be determined  Skilled Therapeutic Intervention Evaluation completed (see details above and below) with education on PT POC and goals and individual treatment initiated with focus on NMR to address postural control re-training, functional transfers throughout session including basic transfers, simulated car transfer  (max assist due to assist with transfer and pt self selecting to step into the car leading with LLE) and toilet transfers, bed mobility, gait, stairs, and cognitive remediation. Pt demonstrates decreased attention and awareness to L requiring cues throughout with mobility for safety. Gait initiated with rail in hallway for RUE support with min to mod assist for facilitation of weightshift, cues for upright posture, and widening BOS. Progressed to trial with RW and L hand orthosis to increase proprioceptive feedback and weightbearing to LUE with mod assist overall for weigthshift as well as for steering of RW - attribute to weakness in LUE as well as decreased awareness to L side x 30' each trial. During stair negotiation requiring min to mod assist for stabilization of L knee in single limb stance but pt self chosen reciprocal stepping pattern and assist for postural control. Cues for safety noted throughout. End of session request to use bathroom and perform mod assist transfers to toilet with husband assisting with pericare per pt request. RN made aware of positive results for urine and bowel and RN assisting with donning of safety belt and chair alarm.   PT Evaluation Precautions/Restrictions Precautions Precautions: Fall;Other (comment) Precaution Comments: flaccid LUE; decreased L attention Restrictions Weight Bearing Restrictions: No  Pain Did not rate with # RN aware. Pain Type: Acute pain Pain Location: Chest Pain Orientation: Left Pain Radiating Towards: left arm and shoulder Home Living/Prior Functioning Home Living Available Help at Discharge: Family Type of Home: Apartment Additional Comments: unable to confirm information without interpreter present  Lives With: Spouse  Per chart, AC reports pt to d/c to home with 4 steps to enter Vision/Perception  Perception Perception: Impaired Inattention/Neglect: Does not attend to left visual field;Does not attend to left side of body   Cognition Overall Cognitive Status: Impaired/Different from baseline Orientation Level: Oriented to person;Oriented to place Awareness: Impaired Problem Solving: Impaired Behaviors: Impulsive Safety/Judgment: Impaired Comments: difficult to assess full cognition due to language barrier; pt appears impulsive and demonstrates decreased overall safety awareness Sensation Sensation Light Touch: Appears Intact Proprioception: Impaired Detail Proprioception Impaired Details: Impaired LUE;Impaired LLE Additional Comments: Pt reports sensation feels the same on R and L Coordination Gross Motor Movements are Fluid and Coordinated: No Motor  Motor Motor: Hemiplegia;Abnormal tone;Abnormal postural alignment and control  Mobility Bed Mobility Bed Mobility: Rolling Right;Right Sidelying to Sit;Sitting - Scoot to Edge of Bed Rolling Right: 3: Mod assist Right Sidelying to Sit: 2: Max assist;3: Mod assist Sitting - Scoot to Edge of Bed: 3: Mod assist Transfers Transfers: Yes Sit to Stand: 3: Mod assist Stand Pivot Transfers: 3: Mod assist;2: Max assist Stand Pivot Transfer Details: Tactile cues for posture;Tactile cues for weight beaing;Tactile cues for weight shifting;Tactile cues for sequencing;Manual facilitation for weight shifting Locomotion  Ambulation Ambulation: Yes Ambulation/Gait Assistance: 3: Mod assist Ambulation Distance (Feet): 30 Feet  Trunk/Postural Assessment  Cervical Assessment Cervical Assessment: Exceptions to WFL(maintains R rotation) Thoracic Assessment Thoracic Assessment: Exceptions to WFL(mild flexion at trunk) Lumbar Assessment Lumbar Assessment: Exceptions to WFL(decreased awareness to neutral positioning on L) Postural Control Postural Control: Deficits on evaluation Trunk Control: impaired Protective Responses: decreased to L   Balance Balance Balance Assessed: Yes Static Sitting Balance Static Sitting - Level of Assistance: 4: Min assist;5: Stand  by assistance Dynamic Sitting Balance Dynamic Sitting - Level of Assistance: 4: Min assist;3: Mod assist Static Standing Balance Static Standing - Level of Assistance: 3: Mod assist;4: Min assist Dynamic Standing Balance Dynamic Standing - Level of Assistance: 3: Mod assist Extremity Assessment      RLE Assessment RLE Assessment: Within Functional Limits LLE Assessment LLE Assessment: Exceptions to WFL LLE Strength LLE Overall Strength Comments: grossly 3+/5 functionally   See Function Navigator for Current Functional Status.   Refer to Care Plan for Long Term Goals  Recommendations for other services: Neuropsych  Discharge Criteria: Patient will be discharged from PT if patient refuses treatment 3 consecutive times without medical reason, if treatment goals not met, if there is a change in medical status, if patient makes no progress towards goals or if patient is discharged from hospital.  The above assessment, treatment plan, treatment alternatives and goals were discussed and mutually agreed upon: by patient and by family  Juanna Cao, PT, DPT  03/06/2017, 11:41 AM

## 2017-03-06 NOTE — Evaluation (Addendum)
Speech Language Pathology Assessment and Plan  Patient Details  Name: Kristen Short MRN: [id number] Date of Birth: 1966/01/07  SLP Diagnosis: Cognitive Impairments;Dysphagia  Rehab Potential: Excellent ELOS: 14-18 days    Today's Date: 03/06/2017 SLP Individual Time: 1100-1200 SLP Individual Time Calculation (min): 60 min   Problem List:  Patient Active Problem List   Diagnosis Date Noted  . Acute ischemic right MCA stroke ([place]) 03/05/2017  . Cognitive deficit, post-stroke   . Itching   . Diabetes mellitus, new onset ([place])   . AKI (acute kidney injury) ([place])   . Slow transit constipation   . Left hemiplegia ([place])   . Type 2 diabetes mellitus without complication, without long-term current use of insulin ([place])   . Hyperlipidemia   . CVA (cerebral vascular accident) ([place]) 02/28/2017   Past Medical History:  Past Medical History:  Diagnosis Date  . CVA (cerebral vascular accident) ([place])   . Itching    chronic issues  . Left wrist fracture    Past Surgical History:  Past Surgical History:  Procedure Laterality Date  . LOOP RECORDER INSERTION N/A 03/05/2017   Procedure: LOOP RECORDER INSERTION;  Surgeon: Evans Lance, MD;  Location: [facility];  Service: Cardiovascular;  Laterality: N/A;    Assessment / Plan / Recommendation Clinical Impression Patient is a 52 y.o. female with no known prior medical history and immigrated from [place] a year ago.  History taken from chart review. She was admitted on 02/28/17 with reports of HA since last Saturday followed by left sided weakness and difficulty walking.  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  MRI brain reviewed, showing right MCA.  Per report, acute large right frontal MCA and right posterior watershed territory infarcts and subacute small right  frontal lobe/MCA infarct with regional mass effect.  2 D echo revealed EF 55-60% with no wall abnormality. Dr. Leonie Man recommended full workup to rule out embolic source. TEE done today and no thrombus or aortic debris but small PFO with positive bubble study. Loop recorder placed. Patient with resultant dense left hemiparesis, left facial weakness, left inattention with right gaze preference, diplopia with question of Left HH as well as impulsivity with cognitive deficits. CIR recommended for follow up therapy and patient admitted 03/05/17.      Patient administered a BSE without interpreter present. Patient presents with mild oral dysphagia secondary to mild left lingual and labial weakness, however, patient demonstrated difficulty following verbal and visual commands, so difficulty to fully assess.  Patient demonstrated efficient mastication with mild oral residue that she independently cleared with a liquid wash. Intermittent anterior spillage of liquids noted with use of a straw without overt s/s of aspiration. Recommend patient continue current diet of Dys. 3 textures with thin liquids with full supervision due to impulsivity.  Interpreter present for cognitive-linguistic evaluation.  Patient presents with impaired attention, attention to left field of environment, awareness, and problem solving with impulsivity that impacts her safety with functional and familiar tasks. Patient would benefit from skilled SLP intervention to maximize her cognitive and swallowing function and overall functional independence prior to discharge.    Skilled Therapeutic Interventions          Administered a BSE and cognitive-linguistic evaluation. Please see above for details.   SLP Assessment  Patient will need skilled Speech Lanaguage Pathology Services during CIR admission  Recommendations  SLP Diet Recommendations: Dysphagia 3 (Mech soft);Thin Liquid Administration via: Cup;Straw Medication Administration: Whole meds  with puree Supervision: Patient able to self feed;Full supervision/cueing for compensatory strategies Compensations: Slow rate;Small sips/bites;Lingual sweep for clearance of pocketing Postural Changes and/or Swallow Maneuvers: Seated upright 90 degrees Oral Care Recommendations: Oral care BID Recommendations for Other Services: Neuropsych consult Patient destination: Home Follow up Recommendations: Home Health SLP;24 hour supervision/assistance;Outpatient SLP Equipment Recommended: None recommended by SLP    SLP Frequency 3 to 5 out of 7 days   SLP Duration  SLP Intensity  SLP Treatment/Interventions 14-18 days  Minumum of 1-2 x/day, 30 to 90 minutes  Cognitive remediation/compensation;Environmental controls;Internal/external aids;Therapeutic Activities;Patient/family education;Functional tasks;Dysphagia/aspiration precaution training;Cueing hierarchy    Pain No/Denies Pain   Prior Functioning Type of Home: Apartment  Lives With: Spouse Available Help at Discharge: Family  Function:  Eating Eating   Modified Consistency Diet: Yes Eating Assist Level: Set up assist for;Supervision or verbal cues   Eating Set Up Assist For: Opening containers       Cognition Comprehension Comprehension assist level: Understands basic 75 - 89% of the time/ requires cueing 10 - 24% of the time  Expression   Expression assist level: Expresses basic 75 - 89% of the time/requires cueing 10 - 24% of the time. Needs helper to occlude trach/needs to repeat words.  Social Interaction Social Interaction assist level: Interacts appropriately 75 - 89% of the time - Needs redirection for appropriate language or to initiate interaction.  Problem Solving Problem solving assist level: Solves basic 50 - 74% of the time/requires cueing 25 - 49% of the time  Memory Memory assist level: Recognizes or recalls 50 - 74% of the time/requires cueing 25 - 49% of the time   Short Term Goals: Week 1: SLP Short  Term Goal 1 (Week 1): Patient will demonstrate efficient mastication and complete oral clearance with trials of regular textures over 2 sessions prior to upgrade with supervision verbal cues.  SLP Short Term Goal 2 (Week 1): Patient will identify 2 physical and 2 cognitive impairments with Min A verbal cues.  SLP Short Term Goal 3 (Week 1): Patient will demonstrate functional problem solving for mildly complex and familiar tasks with Min A verbal cues.  SLP Short Term Goal 4 (Week 1): Patient will attend to left field of enviornment during tasks with Mod A verbal and visual cues.  SLP Short Term Goal 5 (Week 1): Patient will demonstrate selective attention to functional tasks in a mildly distracting enviornment for 30 minutes with Min A verbal cues for redirection.  SLP Short Term Goal 6 (Week 1): Patient will recall new, daily information with Min A verbal and visual cues.   Refer to Care Plan for Long Term Goals  Recommendations for other services: Neuropsych  Discharge Criteria: Patient will be discharged from SLP if patient refuses treatment 3 consecutive times without medical reason, if treatment goals not met, if there is a change in medical status, if patient makes no progress towards goals or if patient is discharged from hospital.  The above assessment, treatment plan, treatment alternatives and goals were discussed and mutually agreed upon: by patient and by family  Billie Trager 03/06/2017, 12:35 PM

## 2017-03-06 NOTE — Progress Notes (Signed)
Bilateral lower extremity venous duplex completed. No evidence of DVT, superficial thrombosis, or Baker cyst. [name], RVS 03/06/2017, 9:03 A<

## 2017-03-07 ENCOUNTER — Encounter (HOSPITAL_COMMUNITY): Payer: Self-pay | Admitting: Cardiovascular Disease

## 2017-03-07 ENCOUNTER — Inpatient Hospital Stay (HOSPITAL_COMMUNITY): Payer: 59 | Admitting: Occupational Therapy

## 2017-03-07 ENCOUNTER — Inpatient Hospital Stay (HOSPITAL_COMMUNITY): Payer: 59 | Admitting: Speech Pathology

## 2017-03-07 ENCOUNTER — Ambulatory Visit (HOSPITAL_COMMUNITY): Payer: 59 | Admitting: *Deleted

## 2017-03-07 MED ORDER — HYDROXYZINE HCL 10 MG PO TABS
10.0000 mg | ORAL_TABLET | Freq: Three times a day (TID) | ORAL | Status: DC | PRN
  Administered 2017-03-07 – 2017-03-15 (×7): 10 mg via ORAL
  Filled 2017-03-07 (×11): qty 1

## 2017-03-07 MED ORDER — MEGESTROL ACETATE 400 MG/10ML PO SUSP
200.0000 mg | Freq: Two times a day (BID) | ORAL | Status: DC
  Administered 2017-03-07 – 2017-03-17 (×21): 200 mg via ORAL
  Filled 2017-03-07 (×21): qty 10

## 2017-03-07 NOTE — Evaluation (Signed)
Recreational Therapy Assessment and Plan  Patient Details  Name: Kristen Short MRN: [id number] Date of Birth: September 04, 1965 Today's Date: 03/07/2017  Rehab Potential: Good ELOS: discharge 2/12   Assessment  Problem List:      Patient Active Problem List   Diagnosis Date Noted  . Acute ischemic right MCA stroke ([place]) 03/05/2017  . Cognitive deficit, post-stroke   . Itching   . Diabetes mellitus, new onset ([place])   . AKI (acute kidney injury) ([place])   . Slow transit constipation   . Left hemiplegia ([place])   . Type 2 diabetes mellitus without complication, without long-term current use of insulin ([place])   . Hyperlipidemia   . CVA (cerebral vascular accident) ([place]) 02/28/2017    Past Medical History:      Past Medical History:  Diagnosis Date  . CVA (cerebral vascular accident) ([place])   . Itching    chronic issues  . Left wrist fracture    Past Surgical History:       Past Surgical History:  Procedure Laterality Date  . LOOP RECORDER INSERTION N/A 03/05/2017   Procedure: LOOP RECORDER INSERTION;  Surgeon: Evans Lance, MD;  Location: [facility];  Service: Cardiovascular;  Laterality: N/A;    Assessment & Plan Clinical Impression: Patient is a 52 y.o. year old female with recent admission to the hospital with no known prior medical historyandimmigrated from [place] a year ago.History taken from chart review. She was admitted on 02/28/17 with reports of HA since last Saturday followed by left sided weakness and difficulty walking. 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 MRI brain reviewed, showing right MCA. Per report, acute large right frontal MCA and right posterior watershed territory infarcts and subacute small right frontal lobe/MCA infarct with regional mass effect. 2 D  echo revealed EF 55-60% with no wall abnormality. Dr. Leonie Man recommended full workup to rule out embolic source. TEE done today and no thrombus or aortic debris but small PFO with positive bubble study. Loop recorder placed.  Patient with resultant dense left hemiparesis, left facial weakness, left inattention with right gaze preference, diplopia with question of Left HH as well as impulsivity with cognitive deficits.  CIR recommended for follow up therapy.  Patient transferred to CIR on 03/05/2017 .   Pt presents with decreased activity tolerance, decreased functional mobility, decreased balance, decreased coordination, left inattention, decreased attention, decreased awareness, decreased problem solving, decreased safety awareness and decreased memory Limiting pt's independence with leisure/community pursuits.  Leisure History/Participation Premorbid leisure interest/current participation: Medical laboratory scientific officer - Building control surveyor - Shopping mall Premorbid leisure interest/no interest in trying: Medical laboratory scientific officer - Building control surveyor - Designer, jewellery Expression Interests: Music (Comment) Other Leisure Interests: Risk manager Leisure Participation Style: With Family/Friends Awareness of Community Resources: Good-identify 3 post discharge leisure resources Psychosocial / Spiritual Patient agreeable to Pet Therapy: Yes Social interaction - Mood/Behavior: Cooperative Academic librarian Appropriate for Education?: Yes Recreational Therapy Orientation Orientation -Reviewed with patient: Available activity resources Strengths/Weaknesses Patient Strengths/Abilities: Willingness to participate;Active premorbidly Patient weaknesses: Physical limitations TR Patient demonstrates impairments in the following area(s): Endurance;Motor;Pain;Safety  Plan Rec Therapy Plan Is patient appropriate for Therapeutic Recreation?: Yes Rehab Potential: Good Treatment times per week: Min 1 TR  session/group>20 minutes during LOS Estimated Length of Stay: discharge 2/12 TR Treatment/Interventions: Adaptive equipment instruction;Group participation (Comment);Therapeutic exercise;Wheelchair propulsion/positioning;1:1 session;Community reintegration;Recreation/leisure participation;UE/LE Chartered certified accountant training;Functional mobility training;Patient/family education;Therapeutic  activities  Recommendations for other services: None   Discharge Criteria: Patient will be discharged from TR if patient refuses treatment 3 consecutive times without medical reason.  If treatment goals not met, if there is a change in medical status, if patient makes no progress towards goals or if patient is discharged from hospital.  The above assessment, treatment plan, treatment alternatives and goals were discussed and mutually agreed upon: by patient  [place] 03/07/2017, 2:31 PM

## 2017-03-07 NOTE — Progress Notes (Signed)
Occupational Therapy Session Note  Patient Details  Name: Kristen Short MRN: [id number] Date of Birth: 18-May-1965  Today's Date: 03/07/2017 OT Individual Time: 2703-5009 OT Individual Time Calculation (min): 86 min    Short Term Goals: Week 1:  OT Short Term Goal 1 (Week 1): Pt will recall hemi techniques with min questioning cues  OT Short Term Goal 2 (Week 1): Pt will complete toilet transfer with min A OT Short Term Goal 3 (Week 1): Pt will locate 2/4 grooming items located on L side of the sink with min questioning cues.  Skilled Therapeutic Interventions/Progress Updates:     OT treatment session focused on L NMR and NMES. Stand-pivot bed>wc>theapy mat>wc>bed with min A overall.  Pt brought to therapy gym and transferred into gravity eliminated side lying with hand placed in UE ranger. Performed gentle scapular mobilizations, elbow/flex/extL and shoulder flex/ext for neuro re-ed.  Provided gentle massage to chest, supraspinatus and upper trap 2/2 tone, then applied 1:1 NMES applied to [facility] 1 supraspinatus and middle deltoid  CH 2 wrist extensors.   Ratio 1:1 Rate 35 pps Waveform- Asymmetric Ramp 1.0 Pulse 300 CH2 Intensity- 25 Duration- 15  CH1 Intensity- 18 Duration -  15   Report of pain at the beginning of session 5 Report of pain at the end of session 5  No adverse reactions after treatment and is skin intact.   OT applied K tape to shoulder for pain management. Upon return to room, pt reported need for bathroom. Stand-pivot to toilet with min A. Voided bladder, then set-up for peri-care and assist to manage clothing. Pt returned to bed and left semi-reclined with needs met and bed alarm on.   Therapy Documentation Precautions:  Precautions Precautions: Fall, Other (comment) Precaution Comments: L hemiplegia; decreased L attention Restrictions Weight Bearing Restrictions: No General:   Vital Signs:   Pain:   ADL: ADL ADL Comments: Please see functional  navigator  See Function Navigator for Current Functional Status.   Therapy/Group: Individual Therapy  Valma Cava 03/07/2017, 2:39 PM

## 2017-03-07 NOTE — Progress Notes (Signed)
Social Work Patient ID: Kristen Short, female   DOB: October 18, 1965, 52 y.o.   MRN: [id number]  Met with pt, sister and nephew who speaks English to inform of team conference goals supervision level and target discharge 2/12. Pt pleased with the date and hopes she will do better than the therapist's feel she will. Will work on discharge needs and informed sister she will need to come in and go through therapies with pt prior to discharge to her home. She was in agreement.

## 2017-03-07 NOTE — Progress Notes (Signed)
Subjective/Complaints: Doppler results reviewed, no complaints of left lower extremity pain  Review of systems difficult to obtain secondary to language.  She does answer some yes/no questions.  Answers no to breathing problems bowel problems or bladder problems.  Objective: Vital Signs: Blood pressure 114/66, pulse 85, temperature 98.1 F (36.7 C), temperature source Axillary, resp. rate 16, weight 75.5 kg (166 lb 7.2 oz), SpO2 99 %. No results found. Results for orders placed or performed during the hospital encounter of 03/05/17 (from the past 72 hour(s))  Glucose, capillary     Status: Abnormal   Collection Time: 03/05/17  6:46 PM  Result Value Ref Range   Glucose-Capillary 119 (H) 65 - 99 mg/dL  Glucose, capillary     Status: Abnormal   Collection Time: 03/05/17  8:46 PM  Result Value Ref Range   Glucose-Capillary 135 (H) 65 - 99 mg/dL   Comment 1 Notify RN   CBC WITH DIFFERENTIAL     Status: Abnormal   Collection Time: 03/06/17  7:06 AM  Result Value Ref Range   WBC 7.4 4.0 - 10.5 K/uL   RBC 6.21 (H) 3.87 - 5.11 MIL/uL   Hemoglobin 15.5 (H) 12.0 - 15.0 g/dL   HCT 48.3 (H) 36.0 - 46.0 %   MCV 77.8 (L) 78.0 - 100.0 fL   MCH 25.0 (L) 26.0 - 34.0 pg   MCHC 32.1 30.0 - 36.0 g/dL   RDW 14.6 11.5 - 15.5 %   Platelets 169 150 - 400 K/uL   Neutrophils Relative % 65 %   Neutro Abs 4.8 1.7 - 7.7 K/uL   Lymphocytes Relative 25 %   Lymphs Abs 1.8 0.7 - 4.0 K/uL   Monocytes Relative 6 %   Monocytes Absolute 0.4 0.1 - 1.0 K/uL   Eosinophils Relative 4 %   Eosinophils Absolute 0.3 0.0 - 0.7 K/uL   Basophils Relative 0 %   Basophils Absolute 0.0 0.0 - 0.1 K/uL  Comprehensive metabolic panel     Status: Abnormal   Collection Time: 03/06/17  7:06 AM  Result Value Ref Range   Sodium 140 135 - 145 mmol/L   Potassium 4.2 3.5 - 5.1 mmol/L   Chloride 107 101 - 111 mmol/L   CO2 22 22 - 32 mmol/L   Glucose, Bld 152 (H) 65 - 99 mg/dL   BUN 25 (H) 6 - 20 mg/dL   Creatinine, Ser 1.14  (H) 0.44 - 1.00 mg/dL   Calcium 9.3 8.9 - 10.3 mg/dL   Total Protein 7.7 6.5 - 8.1 g/dL   Albumin 3.5 3.5 - 5.0 g/dL   AST 27 15 - 41 U/L   ALT 19 14 - 54 U/L   Alkaline Phosphatase 69 38 - 126 U/L   Total Bilirubin 0.5 0.3 - 1.2 mg/dL   GFR calc non Af Amer 54 (L) >60 mL/min   GFR calc Af Amer >60 >60 mL/min    Comment: (NOTE) The eGFR has been calculated using the CKD EPI equation. This calculation has not been validated in all clinical situations. eGFR's persistently <60 mL/min signify possible Chronic Kidney Disease.    Anion gap 11 5 - 15     HEENT: normal Cardio: RRR and No murmurs or extra sounds Resp: CTA B/L and Unlabored GI: BS positive and Nontender and nondistended Extremity:  Pulses positive and No Edema Skin:   Other Loop recorder site clean dry and intact Neuro: Lethargic, Flat, Abnormal Sensory Difficult to assess sensation secondary to language., Abnormal Motor 0/5  in the triceps grip 3- in the left hip flexion as well as knee extension hip extension synergy, 0 at the ankle and Inattention Musc/Skel:  Swelling Left calf with mild tenderness to palpation.  No evidence of knee effusion or left thigh swelling General no acute distress   Assessment/Plan: 1. 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 Physiatrist is providing close team supervision and 24 hour management of active medical problems listed below. Physiatrist and rehab team continue to assess barriers to discharge/monitor patient progress toward functional and medical goals. FIM: Function - Bathing Position: Shower Body parts bathed by patient: Left arm, Chest, Abdomen, Front perineal area, Right upper leg, Left upper leg Body parts bathed by helper: Right lower leg, Left lower leg, Buttocks, Right arm Assist Level: Touching or steadying assistance(Pt  > 75%)  Function- Upper Body Dressing/Undressing What is the patient wearing?: Pull over shirt/dress Pull over shirt/dress - Perfomed by patient: Thread/unthread right sleeve Pull over shirt/dress - Perfomed by helper: Thread/unthread left sleeve, Pull shirt over trunk, Put head through opening Assist Level: Touching or steadying assistance(Pt > 75%) Function - Lower Body Dressing/Undressing What is the patient wearing?: Pants, Underwear, Non-skid slipper socks Position: Wheelchair/chair at sink Underwear - Performed by patient: Thread/unthread right underwear leg Underwear - Performed by helper: Thread/unthread left underwear leg, Pull underwear up/down Pants- Performed by patient: Thread/unthread right pants leg Pants- Performed by helper: Thread/unthread left pants leg, Pull pants up/down Non-skid slipper socks- Performed by patient: Don/doff right sock Non-skid slipper socks- Performed by helper: Don/doff left sock Assist for footwear: Partial/moderate assist Assist for lower body dressing: Touching or steadying assistance (Pt > 75%)  Function - Toileting Toileting steps completed by helper: Adjust clothing prior to toileting, Adjust clothing after toileting, Performs perineal hygiene Toileting Assistive Devices: Grab bar or rail Assist level: Two helpers  Function - Air cabin crew transfer assistive device: Grab bar Assist level to toilet: Moderate assist (Pt 50 - 74%/lift or lower) Assist level from toilet: Moderate assist (Pt 50 - 74%/lift or lower)  Function - Chair/bed transfer Chair/bed transfer activity did not occur: Safety/medical concerns Chair/bed transfer method: Stand pivot Chair/bed transfer assist level: Touching or steadying assistance (Pt > 75%) Chair/bed transfer assistive device: Bedrails, Armrests  Function - Locomotion: Wheelchair Will patient use wheelchair at discharge?: (TBD for community use; primary ambulator) Type: Manual Assist Level:  Dependent (Pt equals 0%) Assist Level: Dependent (Pt equals 0%) Assist Level: Dependent (Pt equals 0%) Turns around,maneuvers to table,bed, and toilet,negotiates 3% grade,maneuvers on rugs and over doorsills: No Function - Locomotion: Ambulation Assistive device: Rail in hallway Max distance: 30' Assist level: Moderate assist (Pt 50 - 74%) Assist level: Moderate assist (Pt 50 - 74%) Walk 50 feet with 2 turns activity did not occur: Safety/medical concerns Walk 150 feet activity did not occur: Safety/medical concerns Walk 10 feet on uneven surfaces activity did not occur: Safety/medical concerns  Function - Comprehension Comprehension: Auditory Comprehension assist level: Other (comment)(Primary language other than English.)  Function - Expression Expression: Verbal Expression assist level: Expresses basic 75 - 89% of the time/requires cueing 10 - 24% of the time. Needs helper to occlude trach/needs to repeat words.  Function - Social Interaction Social Interaction assist level: Interacts appropriately 75 - 89% of the time - Needs redirection for appropriate language or to initiate interaction.  Function - Problem Solving Problem solving assist  level: Solves basic 50 - 74% of the time/requires cueing 25 - 49% of the time  Function - Memory Memory assist level: Recognizes or recalls 50 - 74% of the time/requires cueing 25 - 49% of the time Patient normally able to recall (first 3 days only): That he or she is in a hospital  Medical Problem List and Plan: 1.  Dense left hemiparesis, left facial weakness, left inattention with right gaze preference, diplopia with question of Left HH as well as impulsivity with cognitive deficits secondary to right MCA infarct.  CIR PT OT speech.  Team conference today please see physician documentation under team conference tab, met with team face-to-face to discuss problems,progress, and goals. Formulized individual treatment plan based on medical  history, underlying problem and comorbidities. 2.  DVT Prophylaxis/Anticoagulation: Pharmaceutical: Lovenox. Check dopplers as positive for small PFO.  3. Pain Management: tylenol prn for HA.  4. Mood: LCSW to follow for evaluation and support as mentation improves.  5. Neuropsych: This patient is not fully  capable of making decisions on her  own behalf. 6. Skin/Wound Care: routine pressure relief measures.  7. Fluids/Electrolytes/Nutrition: Monitor I/O.  8. Chronic pruritis: will add low dose gabapentin.,  This was a problem at home, will use topical cream which seems to be helping.  May use Atarax at night 9. New diagnosis T2DM:  Hgb A1C- 7.5--educate patient on CM diet. Will monitor BS ac/hs. May need oral agent.   CBG (last 3)  Recent Labs    03/05/17 1713 03/05/17 1846 03/05/17 2046  GLUCAP 130* 119* 135*  Controlled 03/07/2017 10. Acute kidney injury: due to poor po intake reported by family --in part due to lethargy. Offer fluids during the day. Encourage intake.  BUN mildly elevated creatinine mildly elevated  intake 240 mL recorded yesterday 11. Constipation:  Start bowel program-- add Miralax daily.  12. Dyslipidemia: On Lipitor.    LOS (Days) 2 A FACE TO FACE EVALUATION WAS PERFORMED  Charlett Blake 03/07/2017, 8:53 AM

## 2017-03-07 NOTE — Progress Notes (Signed)
Speech Language Pathology Daily Session Note  Patient Details  Name: Kristen Short MRN: [id number] Date of Birth: 12/13/1965  Today's Date: 03/07/2017 SLP Individual Time: 1000-1100 SLP Individual Time Calculation (min): 60 min  Short Term Goals: Week 1: SLP Short Term Goal 1 (Week 1): Patient will demonstrate efficient mastication and complete oral clearance with trials of regular textures over 2 sessions prior to upgrade with supervision verbal cues.  SLP Short Term Goal 2 (Week 1): Patient will identify 2 physical and 2 cognitive impairments with Min A verbal cues.  SLP Short Term Goal 3 (Week 1): Patient will demonstrate functional problem solving for mildly complex and familiar tasks with Min A verbal cues.  SLP Short Term Goal 4 (Week 1): Patient will attend to left field of enviornment during tasks with Mod A verbal and visual cues.  SLP Short Term Goal 5 (Week 1): Patient will demonstrate selective attention to functional tasks in a mildly distracting enviornment for 30 minutes with Min A verbal cues for redirection.  SLP Short Term Goal 6 (Week 1): Patient will recall new, daily information with Min A verbal and visual cues.   Skilled Therapeutic Interventions: Skilled treatment session focused on cognitive goals. Upon arrival, pt was sitting in wheelchair consuming breakfast. SLP facilitated session by providing supervision verbal cues for problem solving with self-feeding. Patient consumed meal without overt s/s of aspiration. SLP further facilitated session by administering MOCA version 7.2 with interpreter present. Pt demonstrated deficits in attention, problem solving, recall, visual spatial deficits. Due to time constraints, MOCA was not finished and will be completed next session. Towards end of session, pt began to cry out in discomfort due to severe itching. Pt became impulsive and required Max A verbal cues for problem solving and safety. SLP applied lotion and transferred pt back to  bed at end of session. Pt left with alarm on, all needs within reach, calm and comfortable. Continue with current plan of care.    Function:  Eating Eating   Modified Consistency Diet: Yes Eating Assist Level: Set up assist for;Supervision or verbal cues   Eating Set Up Assist For: Opening containers       Cognition Comprehension Comprehension assist level: Understands basic 75 - 89% of the time/ requires cueing 10 - 24% of the time  Expression   Expression assist level: Expresses basic 75 - 89% of the time/requires cueing 10 - 24% of the time. Needs helper to occlude trach/needs to repeat words.  Social Interaction Social Interaction assist level: Interacts appropriately 50 - 74% of the time - May be physically or verbally inappropriate.  Problem Solving Problem solving assist level: Solves basic 25 - 49% of the time - needs direction more than half the time to initiate, plan or complete simple activities  Memory Memory assist level: Recognizes or recalls 50 - 74% of the time/requires cueing 25 - 49% of the time    Pain Pain Assessment Pain Assessment: No/denies pain  Therapy/Group: Individual Therapy  Roxan HockeyYahui Rosalia Mcavoy 03/07/2017, 1:29 PM

## 2017-03-07 NOTE — Progress Notes (Signed)
Physical Therapy Session Note  Patient Details  Name: Kristen BaltimoreHwoc Dowen MRN: [id number] Date of Birth: 20-Mar-1965  Today's Date: 03/07/2017 PT Individual Time: 0900-0955 PT Individual Time Calculation (min): 55 min   Short Term Goals: Week 1:  PT Short Term Goal 1 (Week 1): Pt will perform functional transfers with overall min assist PT Short Term Goal 2 (Week 1): Pt will be able to gait x 50' with min assist with LRAD PT Short Term Goal 3 (Week 1): Pt will be able to go up/down 4 stairs with rail with min assist PT Short Term Goal 4 (Week 1): Pt will demonstrate awareness to LUE placement during mobility 50% of the time during transfers/gait  Skilled Therapeutic Interventions/Progress Updates:    no c/o pain.  Session focus on visual scanning, balance, and LLE NMR during functional activities.    Pt taken to and from therapy gym in w/c total assist.  Stand/pivot throughout session with RW and min assist for balance.  Pt engaged in dynamic sitting balance tasks with tapping ball with RUE and kicking with LLE.  PT provided assist for weight bearing through LUE, but pt c/o soreness, so progressed to PROM stretching to shoulder/elbow/wrist in all planes.   Gait with RW x150' with L hand orthosis, min assist to maintain optimum positioning of L hand on Rw.  Progress to gait training with SBQC and overall mod assist for R weight shift and balance.  nustep x8 minutes at level 3 for reciprocal stepping pattern retraining and activity tolerance.  Pt returned to room at end of session, left upright in w/c with QRB in place, call bell in reach and interpreter present.   Therapy Documentation Precautions:  Precautions Precautions: Fall, Other (comment) Precaution Comments: L hemiplegia; decreased L attention Restrictions Weight Bearing Restrictions: No   See Function Navigator for Current Functional Status.   Therapy/Group: Individual Therapy  Stephania FragminCaitlin E Ivet Guerrieri 03/07/2017, 11:07 AM

## 2017-03-07 NOTE — Patient Care Conference (Signed)
Inpatient RehabilitationTeam Conference and Plan of Care Update Date: 03/07/2017   Time: 11:00 AM    Patient Name: Kristen Short      Medical Record Number: [id number]  Date of Birth: 12-27-1965 Sex: Female         Room/Bed: 4W17C/4W17C-01 Payor Info: Payor: [employer] / Plan: [employer] OTHER / Product Type: *No Product type* /    Admitting Diagnosis: R CVA  Admit Date/Time:  03/05/2017  6:05 PM Admission Comments: No comment available   Primary Diagnosis:  <principal problem not specified> Principal Problem: <principal problem not specified>  Patient Active Problem List   Diagnosis Date Noted  . Acute ischemic right MCA stroke (HCC) 03/05/2017  . Cognitive deficit, post-stroke   . Itching   . Diabetes mellitus, new onset (HCC)   . AKI (acute kidney injury) (HCC)   . Slow transit constipation   . Left hemiplegia (HCC)   . Type 2 diabetes mellitus without complication, without long-term current use of insulin (HCC)   . Hyperlipidemia   . CVA (cerebral vascular accident) (HCC) 02/28/2017    Expected Discharge Date: Expected Discharge Date: 03/20/17  Team Members Present: Physician leading conference: Dr. Claudette LawsAndrew Kirsteins Social Worker Present: Dossie DerBecky Sachi Boulay, LCSW Nurse Present: Kennyth ArnoldStacey Jennings, RN PT Present: Teodoro Kilaitlin Penven-Crew, PT OT Present: Kearney HardElisabeth Doe, OT SLP Present: Feliberto Gottronourtney Payne, SLP PPS Coordinator present : Tora DuckMarie Noel, RN, CRRN     Current Status/Progress Goal Weekly Team Focus  Medical   poor po intake, Left hemiparesis  Reduced fall risk  improve intake   Bowel/Bladder   Continent of B&B.  Maintain continence.  Asess for toileting needs q shift and PRN.   Swallow/Nutrition/ Hydration   Dys. 3 textures with thin liquids, Supervision  Mod I  trials of regular textures, use of swallowing compensatory strategies    ADL's   Mod A overall  Supervision goals  L NMR, functional transfers, L attention, NMES, modified bathing/dressing   Mobility   min assist overall, gait with quad cane up to 150'  supervision   balance, L awareness, functional mobility progression, safety awareness   Communication             Safety/Cognition/ Behavioral Observations  Mod A  Supervision  attention, problem solving, recall and awareness, attention to left    Pain   Occassional surgical site pain at loop recorder site at 6.  <3.  Assess for pain q shift and PRN.   Skin   Scratch marks from itching and surgical site at loop recorder.  Maintain skin integrity.  Focus on releiving itching.      *See Care Plan and progress notes for long and short-term goals.     Barriers to Discharge  Current Status/Progress Possible Resolutions Date Resolved   Physician    Nutrition means;Other (comments)  itching  progressing slowly  family to bring in food      Nursing                  PT  Other (comments)  need to confirm availability of caregiver; recommending 24/7 supervision              OT IV antibiotics                SLP                SW Other (comments)(language barrier due to status not eligible for services-disability and medicaid) has not been here long enough to be eligible for  disability and medicaid            Discharge Planning/Teaching Needs:  Home to sister's and brother in-law's home where sister can provide care. Husband has been here observing and providing support. Encouraged him to go back to work while pt is here.      Team Discussion:  Goals supervision level, currently min-mod. Itching caused by anxiety, according to MD. Prescribed meds for night time and lotion during the day. Tone in arm-OT stretching. Poor po intake encourage and have food brought from home. L-inattention working on this.  Revisions to Treatment Plan:  DC 2/12    Continued Need for Acute Rehabilitation Level of Care: The patient requires daily medical management by a physician with specialized training in physical medicine and rehabilitation for the  following conditions: Daily direction of a multidisciplinary physical rehabilitation program to ensure safe treatment while eliciting the highest outcome that is of practical value to the patient.: Yes Daily medical management of patient stability for increased activity during participation in an intensive rehabilitation regime.: Yes Daily analysis of laboratory values and/or radiology reports with any subsequent need for medication adjustment of medical intervention for : Other;Neurological problems  Lucy Chris 03/07/2017, 12:04 PM

## 2017-03-08 ENCOUNTER — Inpatient Hospital Stay (HOSPITAL_COMMUNITY): Payer: 59 | Admitting: Occupational Therapy

## 2017-03-08 ENCOUNTER — Inpatient Hospital Stay (HOSPITAL_COMMUNITY): Payer: 59 | Admitting: Speech Pathology

## 2017-03-08 ENCOUNTER — Inpatient Hospital Stay (HOSPITAL_COMMUNITY): Payer: 59 | Admitting: *Deleted

## 2017-03-08 NOTE — Progress Notes (Signed)
Recreational Therapy Session Note  Patient Details  Name: Kristen Short MRN: [id number] Date of Birth: 14-Nov-1965 Today's Date: 03/08/2017  Pain: c/o of intermittent L shoulder/arm pain, relief with rest & positioning Skilled Therapeutic Interventions/Progress Updates:  Session focused on safety awareness, visual scanning, dynamic standing balance, hand over hand glided movements of LUE, ambulation with SBQC during co-treat with PT. Pt stood at counter, wet washcloth at sink, practiced sidestepping/ambulation to clean countertop and cabinets (task pt enjoyed and did for work).  Pt also returned items to cabinet shelves and located items in pantry at standing level.  t required multi-modal cues for safety throughout session and min-mod assist for balance.  Therapy/Group: Co-Treatment   Tiegan Terpstra 03/08/2017, 11:40 AM

## 2017-03-08 NOTE — Progress Notes (Signed)
Physical Therapy Session Note  Patient Details  Name: Patsy BaltimoreHwoc Wallen MRN: [id number] Date of Birth: 1965/06/10  Today's Date: 03/08/2017 PT Individual Time: [id number] PT Individual Time Calculation (min): 60 min   Short Term Goals: Week 1:  PT Short Term Goal 1 (Week 1): Pt will perform functional transfers with overall min assist PT Short Term Goal 2 (Week 1): Pt will be able to gait x 50' with min assist with LRAD PT Short Term Goal 3 (Week 1): Pt will be able to go up/down 4 stairs with rail with min assist PT Short Term Goal 4 (Week 1): Pt will demonstrate awareness to LUE placement during mobility 50% of the time during transfers/gait  Skilled Therapeutic Interventions/Progress Updates:    Pt in bed upon arrival, agreeable to PT session. Interpreter present throughout session. Bed mobility, min assist with LLE and cues or attending to Lt side. Pt attempting to stand with LLE still on the bed. Able to assist LLE once aware. Transfers: sit<>stand from w/c, couch performed with armrest and SBQC. Repeated cues needed for sequence. Functional activity: in kitchen working on reaching at various heights and distances for balance and functional training. Cues for safety required as well as attending to Lt side. Gait: ambulating multiple short distances in kitchen, 17120ft with SBQC and min assist. Balance: standing to taps with step at variable angles. Static standing and reaching for balance challenge. Following session, pt returned to room (total assist in w/c to/from). Up in w/c with family present, all needs in reach and chair alarm on. Left in care of nursing for lunch.  Therapy Documentation Precautions:  Precautions Precautions: Fall, Other (comment) Precaution Comments: L hemiplegia; decreased L attention Restrictions Weight Bearing Restrictions: No   Pain:  Reports tenderness in Lt shoulder intermittently, monitored throughout session.   See Function Navigator for Current Functional  Status.   Therapy/Group: Individual Therapy  Delton SeeBenjamin Laureano Hetzer, PT 03/08/2017, 12:23 PM

## 2017-03-08 NOTE — IPOC Note (Signed)
Overall Plan of Care [facility](IPOC) Patient Details Name: Patsy BaltimoreHwoc Caccamo MRN: [id number] DOB: [birth year]  Admitting Diagnosis: <principal problem not specified>  Hospital Problems: Active Problems:   Acute ischemic right MCA stroke [facility](HCC)     Functional Problem List: Nursing Endurance, Medication Management, Safety  PT Balance, Behavior, Edema, Endurance, Motor, Pain, Perception, Safety, Sensory, Skin Integrity  OT Balance, Cognition, Endurance, Motor, Pain, Perception, Safety, Sensory  SLP Cognition  TR Endurance, Motor, Pain, Safety       Basic ADL's: OT Grooming, Eating, Bathing, Toileting, Dressing     Advanced  ADL's: OT       Transfers: PT Bed Mobility, Bed to Chair, Car, Occupational psychologisturniture  OT Toilet, Research scientist (life sciences)Tub/Shower     Locomotion: PT Ambulation, Psychologist, prison and probation servicesWheelchair Mobility, Stairs     Additional Impairments: OT Fuctional Use of Upper Extremity  SLP Swallowing, Social Cognition   Social Interaction, Problem Solving, Memory, Attention, Awareness  TR      Anticipated Outcomes Item Anticipated Outcome  Self Feeding Supervision   Swallowing   Mod I   Basic self-care  Supervision  Toileting  Supervision   Bathroom Transfers Supervision  Bowel/Bladder  Continue to be continent of Bowel and Bladder.  Transfers  supervision basic transfers; min assist car  Locomotion  supervision gait controlled environment, min gait in home environment and min stairs  Communication     Cognition  Supervision   Pain  Pain will be 3 or less.   Safety/Judgment  Patient will adhere to safety plans and be free from falls.    Therapy Plan: PT Intensity: Minimum of 1-2 x/day ,45 to 90 minutes PT Frequency: 5 out of 7 days PT Duration Estimated Length of Stay: 14-18 days OT Intensity: Minimum of 1-2 x/day, 45 to 90 minutes OT Frequency: 5 out of 7 days OT Duration/Estimated Length of Stay: 14-18 days SLP Intensity: Minumum of 1-2 x/day, 30 to 90 minutes SLP Frequency: 3 to 5 out of 7 days SLP  Duration/Estimated Length of Stay: 14-18 days    Team Interventions: Nursing Interventions Disease Management/Prevention, Medication Management, Patient/Family Education  PT interventions Ambulation/gait training, Warden/rangerBalance/vestibular training, Cognitive remediation/compensation, Community reintegration, Discharge planning, Disease management/prevention, DME/adaptive equipment instruction, Functional mobility training, Neuromuscular re-education, Functional electrical stimulation, Pain management, Patient/family education, Psychosocial support, Skin care/wound management, Splinting/orthotics, Stair training, Therapeutic Activities, Therapeutic Exercise, UE/LE Strength taining/ROM, UE/LE Coordination activities, Visual/perceptual remediation/compensation, Wheelchair propulsion/positioning  OT Interventions Warden/rangerBalance/vestibular training, Cognitive remediation/compensation, Community reintegration, Discharge planning, Disease mangement/prevention, DME/adaptive equipment instruction, Functional electrical stimulation, Functional mobility training, Neuromuscular re-education, Pain management, Patient/family education, Self Care/advanced ADL retraining, Skin care/wound managment, Therapeutic Activities, Splinting/orthotics, Therapeutic Exercise, UE/LE Strength taining/ROM, UE/LE Coordination activities, Wheelchair propulsion/positioning, Visual/perceptual remediation/compensation  SLP Interventions Cognitive remediation/compensation, Environmental controls, Internal/external aids, Therapeutic Activities, Patient/family education, Functional tasks, Dysphagia/aspiration precaution training, Cueing hierarchy  TR Interventions Adaptive equipment instruction, Group participation (Comment), Therapeutic exercise, Wheelchair propulsion/positioning, 1:1 session, FirefighterCommunity reintegration, Recreation/leisure participation, UE/LE Coordination activities, Warden/rangerBalance/vestibular training, Functional mobility training, Patient/family  education, Therapeutic activities  SW/CM Interventions Discharge Planning, Patient/Family Education, Psychosocial Support   Barriers to Discharge MD  Medical stability and fluid intake  Nursing      PT Other (comments) need to confirm availability of caregiver; recommending 24/7 supervision  OT IV antibiotics    SLP      SW Other (comments)(language barrier due to status not eligible for services-disability and medicaid) has not been here long enough to be eligible for disability and medicaid   Team Discharge Planning: Destination: PT-Home ,OT- Home , SLP-Home  Projected Follow-up: PT-Home health PT, 24 hour supervision/assistance, OT-  Home health OT, SLP-Home Health SLP, 24 hour supervision/assistance, Outpatient SLP Projected Equipment Needs: PT-To be determined, OT- To be determined, SLP-None recommended by SLP Equipment Details: PT- , OT-  Patient/family involved in discharge planning: PT- Patient, Family member/caregiver,  OT-Patient, Family member/caregiver, SLP-Patient, Family member/caregiver  MD ELOS: 12-14d Medical Rehab Prognosis:  Good Assessment:  52 y.o.femalewith no known prior medical historyandimmigrated from [place] a year ago.History taken from chart review. She was admitted on 02/28/17 with reports of HA since last Saturday followed by left sided weakness and difficulty walking. 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 MRI brain reviewed, showing right MCA.  Per report, acute large right frontal MCA and right posterior watershed territory infarcts and subacute small right frontal lobe/MCA infarct with regional mass effect. 2 D echo revealed EF 55-60% with no wall abnormality.   Dr. Pearlean Brownie recommended full workup to rule out embolic source. TEE done today and no thrombus or aortic debris  but small PFO with positive bubble study. Loop recorder placed.  Patient with resultant dense left hemiparesis, left facial weakness, left inattention with right gaze preference, diplopia with question of Left HH as well as impulsivity with cognitive deficits.  Now requiring 24/7 Rehab RN,MD, as well as CIR level PT, OT and SLP.  Treatment team will focus on ADLs and mobility with goals set at [facility]  See Team Conference Notes for weekly updates to the plan of care

## 2017-03-08 NOTE — Progress Notes (Signed)
Subjective/Complaints: Itching a little better with Sarna, states atarax caused upset stomach  Review of systems difficult to obtain secondary to language.  She does answer some yes/no questions.  Answers no to breathing problems bowel problems or bladder problems.  Objective: Vital Signs: Blood pressure (!) 115/97, pulse 69, temperature 97.6 F (36.4 C), temperature source Oral, resp. rate 18, weight 75.5 kg (166 lb 7.2 oz), SpO2 100 %. No results found. Results for orders placed or performed during the hospital encounter of 03/05/17 (from the past 72 hour(s))  Glucose, capillary     Status: Abnormal   Collection Time: 03/05/17  6:46 PM  Result Value Ref Range   Glucose-Capillary 119 (H) 65 - 99 mg/dL  Glucose, capillary     Status: Abnormal   Collection Time: 03/05/17  8:46 PM  Result Value Ref Range   Glucose-Capillary 135 (H) 65 - 99 mg/dL   Comment 1 Notify RN   CBC WITH DIFFERENTIAL     Status: Abnormal   Collection Time: 03/06/17  7:06 AM  Result Value Ref Range   WBC 7.4 4.0 - 10.5 K/uL   RBC 6.21 (H) 3.87 - 5.11 MIL/uL   Hemoglobin 15.5 (H) 12.0 - 15.0 g/dL   HCT 48.3 (H) 36.0 - 46.0 %   MCV 77.8 (L) 78.0 - 100.0 fL   MCH 25.0 (L) 26.0 - 34.0 pg   MCHC 32.1 30.0 - 36.0 g/dL   RDW 14.6 11.5 - 15.5 %   Platelets 169 150 - 400 K/uL   Neutrophils Relative % 65 %   Neutro Abs 4.8 1.7 - 7.7 K/uL   Lymphocytes Relative 25 %   Lymphs Abs 1.8 0.7 - 4.0 K/uL   Monocytes Relative 6 %   Monocytes Absolute 0.4 0.1 - 1.0 K/uL   Eosinophils Relative 4 %   Eosinophils Absolute 0.3 0.0 - 0.7 K/uL   Basophils Relative 0 %   Basophils Absolute 0.0 0.0 - 0.1 K/uL  Comprehensive metabolic panel     Status: Abnormal   Collection Time: 03/06/17  7:06 AM  Result Value Ref Range   Sodium 140 135 - 145 mmol/L   Potassium 4.2 3.5 - 5.1 mmol/L   Chloride 107 101 - 111 mmol/L   CO2 22 22 - 32 mmol/L   Glucose, Bld 152 (H) 65 - 99 mg/dL   BUN 25 (H) 6 - 20 mg/dL   Creatinine, Ser 1.14  (H) 0.44 - 1.00 mg/dL   Calcium 9.3 8.9 - 10.3 mg/dL   Total Protein 7.7 6.5 - 8.1 g/dL   Albumin 3.5 3.5 - 5.0 g/dL   AST 27 15 - 41 U/L   ALT 19 14 - 54 U/L   Alkaline Phosphatase 69 38 - 126 U/L   Total Bilirubin 0.5 0.3 - 1.2 mg/dL   GFR calc non Af Amer 54 (L) >60 mL/min   GFR calc Af Amer >60 >60 mL/min    Comment: (NOTE) The eGFR has been calculated using the CKD EPI equation. This calculation has not been validated in all clinical situations. eGFR's persistently <60 mL/min signify possible Chronic Kidney Disease.    Anion gap 11 5 - 15     HEENT: normal Cardio: RRR and No murmurs or extra sounds Resp: CTA B/L and Unlabored GI: BS positive and Nontender and nondistended Extremity:  Pulses positive and No Edema Skin:   Other Loop recorder site clean dry and intact Neuro: Lethargic, Flat, Abnormal Sensory Difficult to assess sensation secondary to language., Abnormal  Motor 0/5 in the triceps grip 3- in the left hip flexion as well as knee extension hip extension synergy, 0 at the ankle and Inattention Musc/Skel:  Swelling Left calf with mild tenderness to palpation.  No evidence of knee effusion or left thigh swelling General no acute distress   Assessment/Plan: 1. 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 Physiatrist is providing close team supervision and 24 hour management of active medical problems listed below. Physiatrist and rehab team continue to assess barriers to discharge/monitor patient progress toward functional and medical goals. FIM: Function - Bathing Position: Shower Body parts bathed by patient: Left arm, Chest, Abdomen, Front perineal area, Right upper leg, Left upper leg Body parts bathed by helper: Right lower leg, Left lower leg, Buttocks, Right arm Assist Level: Touching or steadying assistance(Pt  > 75%)  Function- Upper Body Dressing/Undressing What is the patient wearing?: Pull over shirt/dress Pull over shirt/dress - Perfomed by patient: Thread/unthread right sleeve Pull over shirt/dress - Perfomed by helper: Thread/unthread left sleeve, Pull shirt over trunk, Put head through opening Assist Level: Touching or steadying assistance(Pt > 75%) Function - Lower Body Dressing/Undressing What is the patient wearing?: Pants, Underwear, Non-skid slipper socks Position: Wheelchair/chair at sink Underwear - Performed by patient: Thread/unthread right underwear leg Underwear - Performed by helper: Thread/unthread left underwear leg, Pull underwear up/down Pants- Performed by patient: Thread/unthread right pants leg Pants- Performed by helper: Thread/unthread left pants leg, Pull pants up/down Non-skid slipper socks- Performed by patient: Don/doff right sock Non-skid slipper socks- Performed by helper: Don/doff left sock Assist for footwear: Partial/moderate assist Assist for lower body dressing: Touching or steadying assistance (Pt > 75%)  Function - Toileting Toileting steps completed by patient: Performs perineal hygiene Toileting steps completed by helper: Adjust clothing prior to toileting, Adjust clothing after toileting Toileting Assistive Devices: Grab bar or rail Assist level: Touching or steadying assistance (Pt.75%)  Function - Air cabin crew transfer assistive device: Grab bar Assist level to toilet: Touching or steadying assistance (Pt > 75%) Assist level from toilet: Touching or steadying assistance (Pt > 75%)  Function - Chair/bed transfer Chair/bed transfer activity did not occur: Safety/medical concerns Chair/bed transfer method: Stand pivot Chair/bed transfer assist level: Touching or steadying assistance (Pt > 75%) Chair/bed transfer assistive device: Armrests, Walker, Orthosis Chair/bed transfer details: Verbal cues for precautions/safety, Verbal cues for  safe use of DME/AE  Function - Locomotion: Wheelchair Will patient use wheelchair at discharge?: (TBD for community use; primary ambulator) Type: Manual Assist Level: Dependent (Pt equals 0%) Assist Level: Dependent (Pt equals 0%) Assist Level: Dependent (Pt equals 0%) Turns around,maneuvers to table,bed, and toilet,negotiates 3% grade,maneuvers on rugs and over doorsills: No Function - Locomotion: Ambulation Assistive device: Walker-rolling, Cane-quad Max distance: 150' Assist level: Moderate assist (Pt 50 - 74%)(mod with quad cane, min with RW) Assist level: Moderate assist (Pt 50 - 74%) Walk 50 feet with 2 turns activity did not occur: Safety/medical concerns Assist level: Moderate assist (Pt 50 - 74%) Walk 150 feet activity did not occur: Safety/medical concerns Assist level: Moderate assist (Pt 50 - 74%) Walk 10 feet on uneven surfaces activity did not occur: Safety/medical concerns  Function - Comprehension Comprehension: Auditory Comprehension assist level: Understands basic 75 - 89% of the time/ requires cueing 10 - 24% of the time  Function - Expression Expression: Verbal Expression assist level: Expresses basic 75 -  89% of the time/requires cueing 10 - 24% of the time. Needs helper to occlude trach/needs to repeat words.  Function - Social Interaction Social Interaction assist level: Interacts appropriately 50 - 74% of the time - May be physically or verbally inappropriate.  Function - Problem Solving Problem solving assist level: Solves basic 25 - 49% of the time - needs direction more than half the time to initiate, plan or complete simple activities  Function - Memory Memory assist level: Recognizes or recalls 50 - 74% of the time/requires cueing 25 - 49% of the time Patient normally able to recall (first 3 days only): Current season, Location of own room, Staff names and faces, That he or she is in a hospital  Medical Problem List and Plan: 1.  Dense left  hemiparesis, left facial weakness, left inattention with right gaze preference, diplopia with question of Left HH as well as impulsivity with cognitive deficits secondary to right MCA infarct.  CIR PT OT speech.  2.  DVT Prophylaxis/Anticoagulation: Pharmaceutical: Lovenox. Check dopplers as positive for small PFO.  3. Pain Management: tylenol prn for HA.  4. Mood: LCSW to follow for evaluation and support as mentation improves.  5. Neuropsych: This patient is not fully  capable of making decisions on her  own behalf. 6. Skin/Wound Care: routine pressure relief measures.  7. Fluids/Electrolytes/Nutrition: Monitor I/O.  8. Chronic pruritis: This was a problem at home, Cont. topical cream which seems to be helping.  Cont Atarax pr1 at night 9. New diagnosis T2DM:  Hgb A1C- 7.5--educate patient on CM diet. Will monitor BS ac/hs. May need oral agent.   CBG (last 3)  Recent Labs    03/05/17 1713 03/05/17 1846 03/05/17 2046  GLUCAP 130* 119* 135*  Controlled 03/07/2017 10. Acute kidney injury: due to poor po intake reported by family --in part due to lethargy. Offer fluids during the day. Encourage intake.  BUN mildly elevated creatinine mildly elevated  intake 240 mL recorded yesterday 11. Constipation:  Start bowel program-- add Miralax daily.  12. Dyslipidemia: On Lipitor.    LOS (Days) 3 A FACE TO FACE EVALUATION WAS PERFORMED  Charlett Blake 03/08/2017, 7:46 AM

## 2017-03-08 NOTE — Progress Notes (Signed)
Occupational Therapy Session Note  Patient Details  Name: Kristen Short MRN: [id number] Date of Birth: [birth year]  Today's Date: 03/08/2017  Session 1 OT Individual Time: 0900-1000 OT Individual Time Calculation (min): 60 min   Session 2 OT Individual Time: 1345-1415 OT Individual Time Calculation (min): 30 min    Short Term Goals: Week 1:  OT Short Term Goal 1 (Week 1): Pt will recall hemi techniques with min questioning cues  OT Short Term Goal 2 (Week 1): Pt will complete toilet transfer with min A OT Short Term Goal 3 (Week 1): Pt will locate 2/4 grooming items located on L side of the sink with min questioning cues.  Skilled Therapeutic Interventions/Progress Updates:     Session 1 OT treatment session focused on modified bathing/dressin, L NMR, and L attention. Pt greeted semi-reclined in bed and agreeable to OT. Pt with itching spells throughout session. Stand pivot transfers bed>wc>tub bench>wc with min A overall. Pt provided with wash mit and bathing completed with hand over hand A to integrate L UE into bathing tasks for neuro re-ed. Pt needed mod A for UB dressing and instructional cues for hemi-dressing techniques. LB dressing completed with min A overall and encouragement as pt would prefer family to asssist with all BADL. Pt brought to dynavision room and worked on L attention with improved initiation to look to L with repetition. Pt returned to room and left seated in wc with safety belt on and family present.   Session 2 OT treatment session focused on L UE NMES. 1:1 NMES applied to  CH1 supraspinatus and middle deltoid to help approximate shoulder joint to and reduce pain. CH2. Wrist extensors. Pt returned to room at end of session and left semi-reclined in bed with needs met.  Ratio 1:3 Rate 35 pps Waveform- Asymmetric Ramp 1.0 Pulse 300 Intensity- 20 Duration - 15    Report of pain at the beginning of session 5/6 Report of pain at the end of session 4  No adverse  reactions after treatment and is skin intact.    Therapy Documentation Precautions:  Precautions Precautions: Fall, Other (comment) Precaution Comments: L hemiplegia; decreased L attention Restrictions Weight Bearing Restrictions: No ADL: ADL ADL Comments: Please see functional navigator  See Function Navigator for Current Functional Status.   Therapy/Group: Individual Therapy  Valma Cava 03/08/2017, 2:21 PM

## 2017-03-08 NOTE — Progress Notes (Signed)
Speech Language Pathology Daily Session Note  Patient Details  Name: Kristen Short MRN: [id number] Date of Birth: [birth year]  Today's Date: 03/08/2017 SLP Individual Time: 1300-1345 SLP Individual Time Calculation (min): 45 min  Short Term Goals: Week 1: SLP Short Term Goal 1 (Week 1): Patient will demonstrate efficient mastication and complete oral clearance with trials of regular textures over 2 sessions prior to upgrade with supervision verbal cues.  SLP Short Term Goal 2 (Week 1): Patient will identify 2 physical and 2 cognitive impairments with Min A verbal cues.  SLP Short Term Goal 3 (Week 1): Patient will demonstrate functional problem solving for mildly complex and familiar tasks with Min A verbal cues.  SLP Short Term Goal 4 (Week 1): Patient will attend to left field of enviornment during tasks with Mod A verbal and visual cues.  SLP Short Term Goal 5 (Week 1): Patient will demonstrate selective attention to functional tasks in a mildly distracting enviornment for 30 minutes with Min A verbal cues for redirection.  SLP Short Term Goal 6 (Week 1): Patient will recall new, daily information with Min A verbal and visual cues.   Skilled Therapeutic Interventions: Skilled treatment session focused on cognitive goals. SLP facilitated session by continuing to [facility] with interpreter present. Pt scored 15 out of 30 points with a score of 26 and above considered normal. Pt demonstrated deficits in problem solving, recall, attention, and visual-spatial functioning. However, difficult to determine if language barrier/interpreter impacted function on assessment. SLP educated pt and family in regards to current cognitive deficits and goals of skilled SLP intervention. Pt and family verbalized understanding. Pt left upright in wheelchair with all needs within reach and sister present. Continue with current plan of care.    Function:  Cognition Comprehension Comprehension assist  level: Understands basic 50 - 74% of the time/ requires cueing 25 - 49% of the time  Expression   Expression assist level: Expresses basic 75 - 89% of the time/requires cueing 10 - 24% of the time. Needs helper to occlude trach/needs to repeat words.  Social Interaction Social Interaction assist level: Interacts appropriately 50 - 74% of the time - May be physically or verbally inappropriate.  Problem Solving Problem solving assist level: Solves basic 25 - 49% of the time - needs direction more than half the time to initiate, plan or complete simple activities  Memory Memory assist level: Recognizes or recalls 50 - 74% of the time/requires cueing 25 - 49% of the time    Pain Pain Assessment Pain Assessment: No/denies pain  Therapy/Group: Individual Therapy  Roxan HockeyYahui Marl Seago  SLP - Student 03/08/2017, 2:03 PM

## 2017-03-09 ENCOUNTER — Inpatient Hospital Stay (HOSPITAL_COMMUNITY): Payer: 59 | Admitting: Occupational Therapy

## 2017-03-09 ENCOUNTER — Inpatient Hospital Stay (HOSPITAL_COMMUNITY): Payer: 59

## 2017-03-09 ENCOUNTER — Inpatient Hospital Stay (HOSPITAL_COMMUNITY): Payer: 59 | Admitting: Speech Pathology

## 2017-03-09 NOTE — Progress Notes (Signed)
Physical Therapy Session Note  Patient Details  Name: Kristen Short Giovannini MRN: [id number] Date of Birth: [birth year]  Today's Date: 03/09/2017 PT Individual Time: 0903-1005 PT Individual Time Calculation (min): 62 min   Short Term Goals: Week 1:  PT Short Term Goal 1 (Week 1): Pt will perform functional transfers with overall min assist PT Short Term Goal 2 (Week 1): Pt will be able to gait x 50' with min assist with LRAD PT Short Term Goal 3 (Week 1): Pt will be able to go up/down 4 stairs with rail with min assist PT Short Term Goal 4 (Week 1): Pt will demonstrate awareness to LUE placement during mobility 50% of the time during transfers/gait  Skilled Therapeutic Interventions/Progress Updates:    Educated on trial of giv mohr sling during upright mobility to provide support and stability to LUE and fit to patient while utilizing NBQC. Fitted patient with sling and used throughout session with pt report of improved comfort. Focused on NMR to address postural control, functional balance, L attention and perceptual deficits, and coordination/motor control during gait and transfers. Utilizing NBQC for gait training with focus on gait pattern, sequencing, and widening BOS requiring min to mod physical assist and max verbal and demonstrational cues with poor carryover throughout. Dynamic gait through obstacle course to focus on home environment mobility and L attention with max cues as described above and up to mod assist for balance recovery during turns. Card matching activity in standing (and during seated rest breaks due to fatigue) to address visual scanning, balance, and L attention requiring mod verbal cues. Instructed in gait training back to pt room x 150' with min to mod assist and poor carryover of cueing provided. May recommend to return to RW for improved stability and LUE support if carryover of gait with NBQC does not improve. Continue to monitor.   Difficult to assess cognition vs language  barrier even with interpreter present.  Medical interpreter present during session.  Therapy Documentation Precautions:  Precautions Precautions: Fall, Other (comment) Precaution Comments: L hemiplegia; decreased L attention Restrictions Weight Bearing Restrictions: No   Pain:  Reports itchiness (husband applying lotion) and some discomfort in LUE with positioning.    See Function Navigator for Current Functional Status.   Therapy/Group: Individual Therapy  Karolee StampsGray, Najmah Carradine Darrol PokeBrescia  Emiya Loomer B. [place] Mokry, PT, DPT  03/09/2017, 10:13 AM

## 2017-03-09 NOTE — Progress Notes (Signed)
Subjective/Complaints:  Has some abd pain when she gets hungry, last BM yesterday , slept until 4 am woke up with itching, tolerated atarx last noc, relief with SARNA  Review of systems difficult to obtain secondary to language.  She does answer some yes/no questions.  Answers no to breathing problems bowel problems or bladder problems.  Objective: Vital Signs: Blood pressure 123/88, pulse 83, temperature 97.8 F (36.6 C), temperature source Oral, resp. rate 17, weight 75.5 kg (166 lb 7.2 oz), SpO2 99 %. No results found. No results found for this or any previous visit (from the past 72 hour(s)).   HEENT: normal Cardio: RRR and No murmurs or extra sounds Resp: CTA B/L and Unlabored GI: BS positive and Nontender and nondistended Extremity:  Pulses positive and No Edema Skin:   Other Loop recorder site clean dry and intact Neuro: Lethargic, Flat, Abnormal Sensory Difficult to assess sensation secondary to language., Abnormal Motor 0/5 in the triceps grip 3- in the left hip flexion as well as knee extension hip extension synergy, 0 at the ankle and Inattention Musc/Skel:  Swelling Left calf with mild tenderness to palpation.  No evidence of knee effusion or left thigh swelling General no acute distress   Assessment/Plan: 1. 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 Physiatrist is providing close team supervision and 24 hour management of active medical problems listed below. Physiatrist and rehab team continue to assess barriers to discharge/monitor patient progress toward functional and medical goals. FIM: Function - Bathing Position: Shower Body parts bathed by patient: Left arm, Chest, Abdomen, Front perineal area, Right upper leg, Left upper leg Body parts bathed by helper: Right lower leg, Left lower leg, Buttocks, Right  arm Assist Level: (60%, moderate assist level)  Function- Upper Body Dressing/Undressing What is the patient wearing?: Pull over shirt/dress Pull over shirt/dress - Perfomed by patient: Thread/unthread right sleeve Pull over shirt/dress - Perfomed by helper: Thread/unthread left sleeve, Pull shirt over trunk, Put head through opening Assist Level: (25%, maximal assist level) Function - Lower Body Dressing/Undressing What is the patient wearing?: Pants, Underwear, Non-skid slipper socks Position: Wheelchair/chair at sink Underwear - Performed by patient: Thread/unthread right underwear leg Underwear - Performed by helper: Thread/unthread left underwear leg, Pull underwear up/down Pants- Performed by patient: Thread/unthread right pants leg Pants- Performed by helper: Thread/unthread left pants leg, Pull pants up/down Non-skid slipper socks- Performed by patient: Don/doff right sock Non-skid slipper socks- Performed by helper: Don/doff left sock Assist for footwear: Partial/moderate assist Assist for lower body dressing: (38%, maximal assist)  Function - Toileting Toileting steps completed by patient: Performs perineal hygiene Toileting steps completed by helper: Adjust clothing prior to toileting, Adjust clothing after toileting Toileting Assistive Devices: Grab bar or rail Assist level: Touching or steadying assistance (Pt.75%)  Function - ArchivistToilet Transfers Toilet transfer activity did not occur: Safety/medical concerns Toilet transfer assistive device: Grab bar Assist level to toilet: Touching or steadying assistance (Pt > 75%) Assist level from toilet: Touching or steadying assistance (Pt > 75%)  Function - Chair/bed transfer Chair/bed transfer activity did not occur: Safety/medical concerns Chair/bed transfer method: Ambulatory Chair/bed transfer assist level: Touching or steadying assistance (Pt > 75%) Chair/bed transfer assistive device: Armrests, Cane Chair/bed transfer  details: Verbal cues for precautions/safety, Verbal cues for safe use of DME/AE  Function - Locomotion: Wheelchair Will patient use wheelchair at discharge?: (TBD for community  use; primary ambulator) Type: Manual Assist Level: Dependent (Pt equals 0%) Assist Level: Dependent (Pt equals 0%) Assist Level: Dependent (Pt equals 0%) Turns around,maneuvers to table,bed, and toilet,negotiates 3% grade,maneuvers on rugs and over doorsills: No Function - Locomotion: Ambulation Assistive device: Cane-quad Max distance: 120 ft Assist level: Touching or steadying assistance (Pt > 75%) Assist level: Touching or steadying assistance (Pt > 75%) Walk 50 feet with 2 turns activity did not occur: Safety/medical concerns Assist level: Touching or steadying assistance (Pt > 75%) Walk 150 feet activity did not occur: Safety/medical concerns Assist level: Moderate assist (Pt 50 - 74%) Walk 10 feet on uneven surfaces activity did not occur: Safety/medical concerns  Function - Comprehension Comprehension: Auditory Comprehension assist level: Understands basic 50 - 74% of the time/ requires cueing 25 - 49% of the time  Function - Expression Expression: Verbal Expression assist level: Expresses basic 75 - 89% of the time/requires cueing 10 - 24% of the time. Needs helper to occlude trach/needs to repeat words.  Function - Social Interaction Social Interaction assist level: Interacts appropriately 50 - 74% of the time - May be physically or verbally inappropriate.  Function - Problem Solving Problem solving assist level: Solves basic 25 - 49% of the time - needs direction more than half the time to initiate, plan or complete simple activities  Function - Memory Memory assist level: Recognizes or recalls 50 - 74% of the time/requires cueing 25 - 49% of the time Patient normally able to recall (first 3 days only): Current season, Location of own room, Staff names and faces, That he or she is in a  hospital  Medical Problem List and Plan: 1.  Dense left hemiparesis, left facial weakness, left inattention with right gaze preference, diplopia with question of Left HH as well as impulsivity with cognitive deficits secondary to right MCA infarct.  CIR PT OT speech.  2.  DVT Prophylaxis/Anticoagulation: Pharmaceutical: Lovenox. Check dopplers as positive for small PFO.  3. Pain Management: tylenol prn for HA.  4. Mood: LCSW to follow for evaluation and support as mentation improves.  5. Neuropsych: This patient is not fully  capable of making decisions on her  own behalf. 6. Skin/Wound Care: routine pressure relief measures.  7. Fluids/Electrolytes/Nutrition: Monitor I/O.  8. Chronic pruritis: This was a problem at home, Cont. topical cream which seems to be helping.  Cont Atarax pr1 at night 9. New diagnosis T2DM:  Hgb A1C- 7.5--educate patient on CM diet. Will monitor BS ac/hs. May need oral agent.   CBG (last 3)  No results for input(s): GLUCAP in the last 72 hours.Controlled 03/07/2017 10. Acute kidney injury: due to poor po intake reported by family --in part due to lethargy. Offer fluids during the day. Encourage intake.  BUN mildly elevated creatinine mildly elevated  intake 240 mL recorded yesterday 11. Constipation:  Start bowel program-- add Miralax daily.  12. Dyslipidemia: On Lipitor.    LOS (Days) 4 A FACE TO FACE EVALUATION WAS PERFORMED  Erick Colace 03/09/2017, 8:00 AM

## 2017-03-09 NOTE — Progress Notes (Signed)
Speech Language Pathology Daily Session Note  Patient Details  Name: Kristen Short MRN: [id number] Date of Birth: [birth year]  Today's Date: 03/09/2017 SLP Individual Time: 1300-1400 SLP Individual Time Calculation (min): 60 min  Short Term Goals: Week 1: SLP Short Term Goal 1 (Week 1): Patient will demonstrate efficient mastication and complete oral clearance with trials of regular textures over 2 sessions prior to upgrade with supervision verbal cues.  SLP Short Term Goal 2 (Week 1): Patient will identify 2 physical and 2 cognitive impairments with Min A verbal cues.  SLP Short Term Goal 3 (Week 1): Patient will demonstrate functional problem solving for mildly complex and familiar tasks with Min A verbal cues.  SLP Short Term Goal 4 (Week 1): Patient will attend to left field of enviornment during tasks with Mod A verbal and visual cues.  SLP Short Term Goal 5 (Week 1): Patient will demonstrate selective attention to functional tasks in a mildly distracting enviornment for 30 minutes with Min A verbal cues for redirection.  SLP Short Term Goal 6 (Week 1): Patient will recall new, daily information with Min A verbal and visual cues.   Skilled Therapeutic Interventions: Skilled treatment session focused on cognitive goals. Upon arrival, patient was supine in bed. SLP facilitated session by providing Max A verbal cues for problem solving due to impulsivity when transferring her from the bed to the wheelchair. SLP further facilitated session by providing Max A verbal cues for problem solving with a novel, basic task (peg board) and Max A verbal cues for inattention to left field of environment throughout task. Pt required Min A verbal cues for selective attention to task in a moderately distractive environment for ~50 minutes. Pt declined trials of regular textures. Pt left supine in bed with alarm on and all needs within reach. Continue current plan of care.    Function:  Cognition Comprehension  Comprehension assist level: Understands basic 50 - 74% of the time/ requires cueing 25 - 49% of the time  Expression   Expression assist level: Expresses basic 75 - 89% of the time/requires cueing 10 - 24% of the time. Needs helper to occlude trach/needs to repeat words.  Social Interaction Social Interaction assist level: Interacts appropriately 75 - 89% of the time - Needs redirection for appropriate language or to initiate interaction.  Problem Solving Problem solving assist level: Solves basic 25 - 49% of the time - needs direction more than half the time to initiate, plan or complete simple activities  Memory Memory assist level: Recognizes or recalls 50 - 74% of the time/requires cueing 25 - 49% of the time    Pain Pain Assessment Pain Assessment: No/denies pain  Therapy/Group: Individual Therapy  Roxan HockeyYahui Lester Platas  SLP - Student 03/09/2017, 3:53 PM

## 2017-03-09 NOTE — Progress Notes (Signed)
Occupational Therapy Session Note  Patient Details  Name: Kristen Short MRN: [id number] Date of Birth: 07-06-1965  Today's Date: 03/09/2017 OT Individual Time: 1100-1200 OT Individual Time Calculation (min): 60 min   Short Term Goals: Week 1:  OT Short Term Goal 1 (Week 1): Pt will recall hemi techniques with min questioning cues  OT Short Term Goal 2 (Week 1): Pt will complete toilet transfer with min A OT Short Term Goal 3 (Week 1): Pt will locate 2/4 grooming items located on L side of the sink with min questioning cues.  Skilled Therapeutic Interventions/Progress Updates:    OT treatment session focused on modified bathing/dressing, L NMR, and NMES. Pt ambulated with cane bed to shower with min guard A. Bathing completed with min A overall and hand over hand A to integrate L UE. Difficulty teaching hemi-dressing techniques for UB 2/2 language barrier. Pt unable to orient shirt in order to thread L UE. Towel pushes on even surface, then progressed to small wedge surface. 1:1 NMES applied to supraspinatus and middle deltoid to help approximate shoulder joint and reduce pain.   Ratio 1:1 Rate 35 pps Waveform- Asymmetric Ramp 1.0 Pulse 300 Intensity- 18 Duration -   10  Report of pain at the beginning of session 4 Report of pain at the end of session 3  No adverse reactions after treatment and is skin intact.    Therapy Documentation Precautions:  Precautions Precautions: Fall, Other (comment) Precaution Comments: L hemiplegia; decreased L attention Restrictions Weight Bearing Restrictions: No ADL: ADL ADL Comments: Please see functional navigator  See Function Navigator for Current Functional Status.   Therapy/Group: Individual Therapy  Mal Amabilelisabeth S Cyan Clippinger 03/09/2017, 12:25 PM

## 2017-03-10 ENCOUNTER — Inpatient Hospital Stay (HOSPITAL_COMMUNITY): Payer: 59 | Admitting: Speech Pathology

## 2017-03-10 ENCOUNTER — Inpatient Hospital Stay (HOSPITAL_COMMUNITY): Payer: 59 | Admitting: Occupational Therapy

## 2017-03-10 ENCOUNTER — Inpatient Hospital Stay (HOSPITAL_COMMUNITY): Payer: 59 | Admitting: Physical Therapy

## 2017-03-10 LAB — GLUCOSE, CAPILLARY
GLUCOSE-CAPILLARY: 227 mg/dL — AB (ref 65–99)
Glucose-Capillary: 143 mg/dL — ABNORMAL HIGH (ref 65–99)
Glucose-Capillary: 178 mg/dL — ABNORMAL HIGH (ref 65–99)

## 2017-03-10 NOTE — Progress Notes (Signed)
Subjective/Complaints:  Interpreter here No issues overnite except Left shoulder pain, points to trap, no falls or trauma to that area Needing Mod A for transfer with SLP Review of systems difficult to obtain secondary to language.  She does answer some yes/no questions.  Answers no to breathing problems bowel problems or bladder problems.  Objective: Vital Signs: Blood pressure 112/66, pulse 70, temperature (!) 97 F (36.1 C), temperature source Oral, resp. rate 18, weight 75.5 kg (166 lb 7.2 oz), SpO2 100 %. No results found. No results found for this or any previous visit (from the past 72 hour(s)).   HEENT: normal Cardio: RRR and No murmurs or extra sounds Resp: CTA B/L and Unlabored GI: BS positive and Nontender and nondistended Extremity:  Pulses positive and No Edema Skin:   Other Loop recorder site clean dry and intact Neuro: Lethargic, Flat, Abnormal Sensory Difficult to assess sensation secondary to language., Abnormal Motor 0/5 in the triceps grip 3- in the left hip flexion as well as knee extension hip extension synergy, 0 at the ankle and Inattention Musc/Skel:  Swelling Left calf with mild tenderness to palpation.  No evidence of knee effusion or left thigh swelling General no acute distress   Assessment/Plan: 1. 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 Physiatrist is providing close team supervision and 24 hour management of active medical problems listed below. Physiatrist and rehab team continue to assess barriers to discharge/monitor patient progress toward functional and medical goals. FIM: Function - Bathing Position: Shower Body parts bathed by patient: Left arm, Chest, Abdomen, Front perineal area, Right upper leg, Left upper leg, Right lower leg, Left lower leg, Buttocks Body parts bathed by helper: Right  arm Assist Level: Touching or steadying assistance(Pt > 75%)  Function- Upper Body Dressing/Undressing What is the patient wearing?: Pull over shirt/dress Pull over shirt/dress - Perfomed by patient: Thread/unthread right sleeve, Put head through opening Pull over shirt/dress - Perfomed by helper: Thread/unthread left sleeve, Pull shirt over trunk Assist Level: Touching or steadying assistance(Pt > 75%) Function - Lower Body Dressing/Undressing What is the patient wearing?: Pants, Underwear, Non-skid slipper socks Position: Wheelchair/chair at sink Underwear - Performed by patient: Thread/unthread left underwear leg, Thread/unthread right underwear leg Underwear - Performed by helper: Pull underwear up/down Pants- Performed by patient: Thread/unthread left pants leg, Thread/unthread right pants leg Pants- Performed by helper: Pull pants up/down Non-skid slipper socks- Performed by patient: Don/doff left sock, Don/doff right sock Non-skid slipper socks- Performed by helper: Don/doff left sock Assist for footwear: Supervision/touching assist Assist for lower body dressing: Touching or steadying assistance (Pt > 75%)  Function - Toileting Toileting steps completed by patient: Adjust clothing prior to toileting, Performs perineal hygiene Toileting steps completed by helper: Adjust clothing after toileting Toileting Assistive Devices: Grab bar or rail Assist level: Touching or steadying assistance (Pt.75%)  Function - ArchivistToilet Transfers Toilet transfer activity did not occur: Safety/medical concerns Toilet transfer assistive device: Grab bar Assist level to toilet: Touching or steadying assistance (Pt > 75%) Assist level from toilet: Touching or steadying assistance (Pt > 75%)  Function - Chair/bed transfer Chair/bed transfer activity did not occur: Safety/medical concerns Chair/bed transfer method: Ambulatory, Stand pivot Chair/bed transfer assist level: Touching or steadying assistance  (Pt > 75%) Chair/bed transfer assistive device: Armrests, Cane Chair/bed transfer details: Verbal cues for precautions/safety, Verbal cues for safe use of DME/AE  Function -  Locomotion: Wheelchair Will patient use wheelchair at discharge?: (TBD for community use; primary ambulator) Type: Manual Assist Level: Dependent (Pt equals 0%) Assist Level: Dependent (Pt equals 0%) Assist Level: Dependent (Pt equals 0%) Turns around,maneuvers to table,bed, and toilet,negotiates 3% grade,maneuvers on rugs and over doorsills: No Function - Locomotion: Ambulation Assistive device: Cane-quad, Orthosis Max distance: 150' Assist level: Touching or steadying assistance (Pt > 75%) Assist level: Touching or steadying assistance (Pt > 75%) Walk 50 feet with 2 turns activity did not occur: Safety/medical concerns Assist level: Moderate assist (Pt 50 - 74%) Walk 150 feet activity did not occur: Safety/medical concerns Assist level: Moderate assist (Pt 50 - 74%) Walk 10 feet on uneven surfaces activity did not occur: Safety/medical concerns  Function - Comprehension Comprehension: Auditory Comprehension assist level: Understands basic 50 - 74% of the time/ requires cueing 25 - 49% of the time  Function - Expression Expression: Verbal Expression assist level: Expresses basic 75 - 89% of the time/requires cueing 10 - 24% of the time. Needs helper to occlude trach/needs to repeat words.  Function - Social Interaction Social Interaction assist level: Interacts appropriately 75 - 89% of the time - Needs redirection for appropriate language or to initiate interaction.  Function - Problem Solving Problem solving assist level: Solves basic 25 - 49% of the time - needs direction more than half the time to initiate, plan or complete simple activities  Function - Memory Memory assist level: Recognizes or recalls 50 - 74% of the time/requires cueing 25 - 49% of the time Patient normally able to recall (first 3  days only): Current season, Location of own room, Staff names and faces, That he or she is in a hospital  Medical Problem List and Plan: 1.  Dense left hemiparesis, left facial weakness, left inattention with right gaze preference, diplopia with question of Left HH as well as impulsivity with cognitive deficits secondary to right MCA infarct.  CIR PT OT speech.  2.  DVT Prophylaxis/Anticoagulation: Pharmaceutical: Lovenox. Check dopplers as positive for small PFO.  3. Pain Management: tylenol prn for HA. Left trap myofascial pain 4. Mood: LCSW to follow for evaluation and support as mentation improves.  5. Neuropsych: This patient is not fully  capable of making decisions on her  own behalf. 6. Skin/Wound Care: routine pressure relief measures.  7. Fluids/Electrolytes/Nutrition: Monitor I/O.  8. Chronic pruritis: This was a problem at home, Cont. topical cream which seems to be helping.  Cont Atarax prn at night 9. New diagnosis T2DM:  Hgb A1C- 7.5--educate patient on CM diet. Will monitor BS ac/hs. May need oral agent.  CBG not recorded in EPIC   No results for input(s): GLUCAP in the last 72 hours. 10. Acute kidney injury: due to poor po intake reported by family --in part due to lethargy. Offer fluids during the day. Encourage intake.  BUN mildly elevated creatinine mildly elevated  intake 240 mL recorded yesterday 11. Constipation:  Start bowel program-- add Miralax daily.  12. Dyslipidemia: On Lipitor.    LOS (Days) 5 A FACE TO FACE EVALUATION WAS PERFORMED  Erick Colace 03/10/2017, 9:22 AM

## 2017-03-10 NOTE — Progress Notes (Signed)
Speech Language Pathology Daily Session Note  Patient Details  Name: Kristen Short MRN: [id number] Date of Birth: 05-11-1965  Today's Date: 03/10/2017 SLP Individual Time: 0915-1000 SLP Individual Time Calculation (min): 45 min  Short Term Goals: Week 1: SLP Short Term Goal 1 (Week 1): Patient will demonstrate efficient mastication and complete oral clearance with trials of regular textures over 2 sessions prior to upgrade with supervision verbal cues.  SLP Short Term Goal 2 (Week 1): Patient will identify 2 physical and 2 cognitive impairments with Min A verbal cues.  SLP Short Term Goal 3 (Week 1): Patient will demonstrate functional problem solving for mildly complex and familiar tasks with Min A verbal cues.  SLP Short Term Goal 4 (Week 1): Patient will attend to left field of enviornment during tasks with Mod A verbal and visual cues.  SLP Short Term Goal 5 (Week 1): Patient will demonstrate selective attention to functional tasks in a mildly distracting enviornment for 30 minutes with Min A verbal cues for redirection.  SLP Short Term Goal 6 (Week 1): Patient will recall new, daily information with Min A verbal and visual cues.   Skilled Therapeutic Interventions: Skilled treatment session focused on dysphagia and cognition goals. SLP received pt sitting EOB with husband and interpreter present. Pt immediately provided intellectual awareness/description of her facial weakness, chewing and speech/dysarthria d/t facial weakness. Interpreter present to facilitate session however his comprehension/expression of English required multiple attempts before correct message was perceived to be conveyed. MD in during session and pt with appropriate understanding and expression of basic to semi-complex information and expression of wants/needs to MD. SLP further facilitated by providing supervision during transfer for toileting. Pt was continent of bowel and bladder. SLP further facilitated session by  providing skilled observation of pt consuming graham crackers with peanut butter. Pt was very aware that this texture required more lingual manipulation that current diet. After 4 bites, pt refused further intake d/t fact that her "tongue was tired." Pt with good oral ability during consumption and appeared to self-limit herself. Pt handed off to husband to return pt to room.      Function:  Eating Eating   Modified Consistency Diet: No Eating Assist Level: Set up assist for;Supervision or verbal cues   Eating Set Up Assist For: Opening containers       Cognition Comprehension Comprehension assist level: Understands basic 50 - 74% of the time/ requires cueing 25 - 49% of the time;Understands basic 75 - 89% of the time/ requires cueing 10 - 24% of the time  Expression   Expression assist level: Expresses basic 75 - 89% of the time/requires cueing 10 - 24% of the time. Needs helper to occlude trach/needs to repeat words.  Social Interaction    Problem Solving Problem solving assist level: Solves basic 25 - 49% of the time - needs direction more than half the time to initiate, plan or complete simple activities  Memory Memory assist level: Recognizes or recalls 50 - 74% of the time/requires cueing 25 - 49% of the time    Pain    Therapy/Group: Individual Therapy  Shon Indelicato 03/10/2017, 10:34 AM

## 2017-03-10 NOTE — Progress Notes (Signed)
Physical Therapy Session Note  Patient Details  Name: Kristen Short MRN: [id number] Date of Birth: 02-14-1965  Today's Date: 03/10/2017 PT Individual Time: 1415-1510 PT Individual Time Calculation (min): 55 min   Short Term Goals: Week 1:  PT Short Term Goal 1 (Week 1): Pt will perform functional transfers with overall min assist PT Short Term Goal 2 (Week 1): Pt will be able to gait x 50' with min assist with LRAD PT Short Term Goal 3 (Week 1): Pt will be able to go up/down 4 stairs with rail with min assist PT Short Term Goal 4 (Week 1): Pt will demonstrate awareness to LUE placement during mobility 50% of the time during transfers/gait  Skilled Therapeutic Interventions/Progress Updates:   Pt sitting up in bed upon arrival w/ family, agreeable to therapy and no c/o pain. Transferred to EOB w/ supervision and assisted w/ donning socks and shoes, mod assist for LLE. Ambulated to/from therapy gym w/ min assist using quad cane, 150' each way. Verbal cues for gait pattern and manual facilitation of weight shifting. Focused on dynamic standing balance w/o UE support while in gym. Performed RUE reaching tasks emphasizing lateral weight shifting while performing trunk rotation. Min guard for safety, 5 -10 reps at a time. Stood on both firm and foam surfaces, min assist on foam. Worked on L quad control during seated rest breaks w/ emphasis on prolonged contraction. Ambulated 125' to day room via same technique above. Pt fades to min-mod assist using quad cane when performing step-through pattern vs step-to. Verbal cues for step-to pattern. Performed NuStep 5 min x2 @ L3 to work on LE strengthening and reciprocal movement pattern. Occasional verbal and visual cues for technique. Total assist w/c transport back to room 2/2 fatigue. Transferred to EOB and to supine w/ min assist. Ended session in supine and in care of family. All needs met.  Therapy Documentation Precautions:  Precautions Precautions: Fall,  Other (comment) Precaution Comments: L hemiplegia; decreased L attention Restrictions Weight Bearing Restrictions: No Vital Signs: Therapy Vitals Pulse Rate: 83 Resp: 18 BP: 117/67 Patient Position (if appropriate): Lying Oxygen Therapy SpO2: 100 % O2 Device: Not Delivered  See Function Navigator for Current Functional Status.   Therapy/Group: Individual Therapy  Uriel Dowding K Arnette 03/10/2017, 3:12 PM

## 2017-03-10 NOTE — Progress Notes (Signed)
Occupational Therapy Session Note  Patient Details  Name: Kristen Short MRN: [id number] Date of Birth: 1965/12/14  Today's Date: 03/10/2017 OT Individual Time: 1000-1100 OT Individual Time Calculation (min): 60 min    Short Term Goals: Week 1:  OT Short Term Goal 1 (Week 1): Pt will recall hemi techniques with min questioning cues  OT Short Term Goal 2 (Week 1): Pt will complete toilet transfer with min A OT Short Term Goal 3 (Week 1): Pt will locate 2/4 grooming items located on L side of the sink with min questioning cues.  Skilled Therapeutic Interventions/Progress Updates:    Pt received in bed with spouse and interpretor in the room. Spouse initially began assisting pt to EOB by having her pull up on him.  Educated spouse and pt with demonstration how to guide her to sidelying with pt using R hand to support L arm at elbow and then pt could push herself to sit with R hand with guiding A.  Pt did not want to shower but did want to change pants. Ambulated with cane with min A to sit on tub seat in bathroom to doff old pants and don new ones. Guiding A to place L foot in first. Steadying A as she was reaching forward to don pants.  Pt ambulated to sink with min A for LUE wt bearing on sink as she brushed teeth and hair.  With entering and exiting bathroom pt needs max cues to turn head to L as she turns her body towards the R demonstrating significant L neglect. Pt taken to gym via w/c and transferred to therapy mat.  On mat worked on L scapular ROM and light massage as pt states she feels tightness behind her shoulder.  A/arom of LUE in gravity eliminated table top slides. Pt does have some active shoulder to assist movement.  Guiding grasp and release of cups.  Once hand placed on cup by therapist pt could squeeze hand slightly and activate wrist for the motion needed for stacking cups. Pt needed frequent cues to attend to her hand and shift her gaze.  Pt then ambulated back to room with min-mod A  using quad cane with support through L shoulder. Pt wanted to lay down. Resting in bed with bed alarm set and all needs met.    Therapy Documentation Precautions:  Precautions Precautions: Fall, Other (comment) Precaution Comments: L hemiplegia; decreased L attention Restrictions Weight Bearing Restrictions: No    Pain: Pain Assessment Pain Score: 0-No pain ADL: ADL ADL Comments: Please see functional navigator   See Function Navigator for Current Functional Status.   Therapy/Group: Individual Therapy  [place] 03/10/2017, 12:08 PM

## 2017-03-10 NOTE — Progress Notes (Signed)
Occupational Therapy Session Note  Patient Details  Name: Kristen Short MRN: [id number] Date of Birth: 1965-10-19  Today's Date: 03/10/2017 OT Individual Time: 1300-1345 OT Individual Time Calculation (min): 45 min   Short Term Goals: Week 1:  OT Short Term Goal 1 (Week 1): Pt will recall hemi techniques with min questioning cues  OT Short Term Goal 2 (Week 1): Pt will complete toilet transfer with min A OT Short Term Goal 3 (Week 1): Pt will locate 2/4 grooming items located on L side of the sink with min questioning cues.  Skilled Therapeutic Interventions/Progress Updates:    Pt greeted in bedside chair with safety belt fastened. Tx focus Lt scanning, attention, and perceptual skills. Had pt engage in new learning task of assembling 24 piece puzzle. Facilitated Lt forearm weightbearing on table during task for NMR. Pt requiring max multimodal cues for sustained attention, basic problem solving, and scanning to Lt side for puzzle pieces. Noted spatial awareness and visual closure deficits as well. At end of tx pt remained in bedside chair with safety belt fastened and family present.   Interpretor present during tx    Therapy Documentation Precautions:  Precautions Precautions: Fall, Other (comment) Precaution Comments: L hemiplegia; decreased L attention Restrictions Weight Bearing Restrictions: No Vital Signs: Therapy Vitals Pulse Rate: 83 Resp: 18 BP: 117/67 Patient Position (if appropriate): Lying Oxygen Therapy SpO2: 100 % O2 Device: Not Delivered Pain: No c/o pain during tx   ADL: ADL ADL Comments: Please see functional navigator     See Function Navigator for Current Functional Status.   Therapy/Group: Individual Therapy  Kristen Short A Samarah Hogle 03/10/2017, 3:58 PM

## 2017-03-11 LAB — GLUCOSE, CAPILLARY
GLUCOSE-CAPILLARY: 275 mg/dL — AB (ref 65–99)
GLUCOSE-CAPILLARY: 295 mg/dL — AB (ref 65–99)
Glucose-Capillary: 160 mg/dL — ABNORMAL HIGH (ref 65–99)
Glucose-Capillary: 172 mg/dL — ABNORMAL HIGH (ref 65–99)

## 2017-03-12 ENCOUNTER — Inpatient Hospital Stay (HOSPITAL_COMMUNITY): Payer: 59 | Admitting: Speech Pathology

## 2017-03-12 ENCOUNTER — Inpatient Hospital Stay (HOSPITAL_COMMUNITY): Payer: 59 | Admitting: Physical Therapy

## 2017-03-12 ENCOUNTER — Inpatient Hospital Stay (HOSPITAL_COMMUNITY): Payer: 59 | Admitting: Occupational Therapy

## 2017-03-12 LAB — BASIC METABOLIC PANEL
ANION GAP: 11 (ref 5–15)
BUN: 14 mg/dL (ref 6–20)
CHLORIDE: 106 mmol/L (ref 101–111)
CO2: 21 mmol/L — AB (ref 22–32)
Calcium: 9.7 mg/dL (ref 8.9–10.3)
Creatinine, Ser: 0.97 mg/dL (ref 0.44–1.00)
GFR calc non Af Amer: >60 mL/min (ref >60)
GLUCOSE: 205 mg/dL — AB (ref 65–99)
Potassium: 4 mmol/L (ref 3.5–5.1)
Sodium: 138 mmol/L (ref 135–145)

## 2017-03-12 LAB — GLUCOSE, CAPILLARY
GLUCOSE-CAPILLARY: 117 mg/dL — AB (ref 65–99)
GLUCOSE-CAPILLARY: 164 mg/dL — AB (ref 65–99)
GLUCOSE-CAPILLARY: 253 mg/dL — AB (ref 65–99)
Glucose-Capillary: 201 mg/dL — ABNORMAL HIGH (ref 65–99)

## 2017-03-12 LAB — CBC
HCT: 44.2 % (ref 36.0–46.0)
HEMOGLOBIN: 14.3 g/dL (ref 12.0–15.0)
MCH: 24.8 pg — ABNORMAL LOW (ref 26.0–34.0)
MCHC: 32.4 g/dL (ref 30.0–36.0)
MCV: 76.6 fL — AB (ref 78.0–100.0)
Platelets: 191 10*3/uL (ref 150–400)
RBC: 5.77 MIL/uL — AB (ref 3.87–5.11)
RDW: 14.2 % (ref 11.5–15.5)
WBC: 7.1 10*3/uL (ref 4.0–10.5)

## 2017-03-12 MED ORDER — GLIMEPIRIDE 2 MG PO TABS
1.0000 mg | ORAL_TABLET | Freq: Every day | ORAL | Status: DC
  Administered 2017-03-12 – 2017-03-14 (×3): 1 mg via ORAL
  Filled 2017-03-12 (×3): qty 1

## 2017-03-12 NOTE — Progress Notes (Signed)
Speech Language Pathology Daily Session Note  Patient Details  Name: Patsy BaltimoreHwoc Zemaitis MRN: [id number] Date of Birth: 27-Nov-1965  Today's Date: 03/12/2017 SLP Individual Time: 1300-1415 SLP Individual Time Calculation (min): 75 min  Short Term Goals: Week 1: SLP Short Term Goal 1 (Week 1): Patient will demonstrate efficient mastication and complete oral clearance with trials of regular textures over 2 sessions prior to upgrade with supervision verbal cues.  SLP Short Term Goal 2 (Week 1): Patient will identify 2 physical and 2 cognitive impairments with Min A verbal cues.  SLP Short Term Goal 3 (Week 1): Patient will demonstrate functional problem solving for mildly complex and familiar tasks with Min A verbal cues.  SLP Short Term Goal 4 (Week 1): Patient will attend to left field of enviornment during tasks with Mod A verbal and visual cues.  SLP Short Term Goal 5 (Week 1): Patient will demonstrate selective attention to functional tasks in a mildly distracting enviornment for 30 minutes with Min A verbal cues for redirection.  SLP Short Term Goal 6 (Week 1): Patient will recall new, daily information with Min A verbal and visual cues.   Skilled Therapeutic Interventions: Skilled treatment session focused on cognitive goals. SLP facilitated session by providing Max A verbal cues for problem solving due to impulsivity when transferring from the bed to the wheelchair. Pt required Max A verbal cues for problem solving during a mildly complex 4-step picture sequencing task and attention to the left field of environment. Patient demonstrated selective attention to task in a moderately distractive environment with supervision verbal cues for ~60 minutes. At end of session, SLP provided Max A verbal cues for safety due to impulsivity with transfer. Patient left supine in bed with alarm on and all needs within reach. Continue with current plan of care.    Function:  Cognition Comprehension Comprehension  assist level: Understands basic 75 - 89% of the time/ requires cueing 10 - 24% of the time  Expression   Expression assist level: Expresses basic 75 - 89% of the time/requires cueing 10 - 24% of the time. Needs helper to occlude trach/needs to repeat words.  Social Interaction Social Interaction assist level: Interacts appropriately 75 - 89% of the time - Needs redirection for appropriate language or to initiate interaction.  Problem Solving Problem solving assist level: Solves basic 25 - 49% of the time - needs direction more than half the time to initiate, plan or complete simple activities  Memory Memory assist level: Recognizes or recalls 50 - 74% of the time/requires cueing 25 - 49% of the time    Pain Pain Assessment Pain Assessment: No/denies pain  Therapy/Group: Individual Therapy  Roxan HockeyYahui Cyndia Degraff  SLP - Student 03/12/2017, 3:42 PM

## 2017-03-12 NOTE — Progress Notes (Signed)
Subjective/Complaints:  No issues overnite, pt has some mild Left shoulder pain Bright and alert this am Review of systems difficult to obtain secondary to language.  She does answer some yes/no questions.  Answers no to breathing problems bowel problems or bladder problems.  Objective: Vital Signs: Blood pressure (!) 109/50, pulse 72, temperature 99 F (37.2 C), temperature source Oral, resp. rate 18, weight 75.5 kg (166 lb 7.2 oz), SpO2 98 %. No results found. Results for orders placed or performed during the hospital encounter of 03/05/17 (from the past 72 hour(s))  Glucose, capillary     Status: Abnormal   Collection Time: 03/10/17 11:54 AM  Result Value Ref Range   Glucose-Capillary 143 (H) 65 - 99 mg/dL  Glucose, capillary     Status: Abnormal   Collection Time: 03/10/17  4:32 PM  Result Value Ref Range   Glucose-Capillary 178 (H) 65 - 99 mg/dL  Glucose, capillary     Status: Abnormal   Collection Time: 03/10/17  8:59 PM  Result Value Ref Range   Glucose-Capillary 227 (H) 65 - 99 mg/dL  Glucose, capillary     Status: Abnormal   Collection Time: 03/11/17  6:22 AM  Result Value Ref Range   Glucose-Capillary 275 (H) 65 - 99 mg/dL  Glucose, capillary     Status: Abnormal   Collection Time: 03/11/17 11:56 AM  Result Value Ref Range   Glucose-Capillary 160 (H) 65 - 99 mg/dL  Glucose, capillary     Status: Abnormal   Collection Time: 03/11/17  4:37 PM  Result Value Ref Range   Glucose-Capillary 172 (H) 65 - 99 mg/dL  Glucose, capillary     Status: Abnormal   Collection Time: 03/11/17  9:09 PM  Result Value Ref Range   Glucose-Capillary 295 (H) 65 - 99 mg/dL  Glucose, capillary     Status: Abnormal   Collection Time: 03/12/17  6:54 AM  Result Value Ref Range   Glucose-Capillary 201 (H) 65 - 99 mg/dL     HEENT: normal Cardio: RRR and No murmurs or extra sounds Resp: CTA B/L and Unlabored GI: BS positive and Nontender and nondistended Extremity:  Pulses positive and No  Edema Skin:   Other Loop recorder site clean dry and intact Neuro: Lethargic, Flat, Abnormal Sensory Difficult to assess sensation secondary to language., Abnormal Motor 0/5 in the triceps grip 3- in the left hip flexion as well as knee extension hip extension synergy, 0 at the ankle and Inattention Musc/Skel:  Swelling Left calf with mild tenderness to palpation.  No evidence of knee effusion or left thigh swelling General no acute distress   Assessment/Plan: 1. 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 Physiatrist is providing close team supervision and 24 hour management of active medical problems listed below. Physiatrist and rehab team continue to assess barriers to discharge/monitor patient progress toward functional and medical goals. FIM: Function - Bathing Position: Shower Body parts bathed by patient: Left arm, Chest, Abdomen, Front perineal area, Right upper leg, Left upper leg, Right lower leg, Left lower leg, Buttocks Body parts bathed by helper: Right arm Assist Level: Touching or steadying assistance(Pt > 75%)  Function- Upper Body Dressing/Undressing What is the patient wearing?: Pull over shirt/dress Pull over shirt/dress - Perfomed by patient: Thread/unthread right sleeve, Put head through opening Pull over shirt/dress - Perfomed by helper: Thread/unthread left sleeve, Pull shirt over  trunk Assist Level: Touching or steadying assistance(Pt > 75%) Function - Lower Body Dressing/Undressing What is the patient wearing?: Pants, Underwear, Non-skid slipper socks Position: Wheelchair/chair at sink Underwear - Performed by patient: Thread/unthread left underwear leg, Thread/unthread right underwear leg Underwear - Performed by helper: Pull underwear up/down Pants- Performed by patient: Thread/unthread left pants leg,  Thread/unthread right pants leg Pants- Performed by helper: Pull pants up/down Non-skid slipper socks- Performed by patient: Don/doff left sock, Don/doff right sock Non-skid slipper socks- Performed by helper: Don/doff left sock Assist for footwear: Supervision/touching assist Assist for lower body dressing: Touching or steadying assistance (Pt > 75%)  Function - Toileting Toileting steps completed by patient: Performs perineal hygiene Toileting steps completed by helper: Adjust clothing prior to toileting, Adjust clothing after toileting Toileting Assistive Devices: Grab bar or rail Assist level: Touching or steadying assistance (Pt.75%)  Function - Archivist transfer activity did not occur: Safety/medical concerns Toilet transfer assistive device: Grab bar Assist level to toilet: Touching or steadying assistance (Pt > 75%) Assist level from toilet: Touching or steadying assistance (Pt > 75%)  Function - Chair/bed transfer Chair/bed transfer activity did not occur: Safety/medical concerns Chair/bed transfer method: Ambulatory, Stand pivot Chair/bed transfer assist level: Touching or steadying assistance (Pt > 75%) Chair/bed transfer assistive device: Armrests, Cane Chair/bed transfer details: Verbal cues for precautions/safety, Verbal cues for safe use of DME/AE  Function - Locomotion: Wheelchair Will patient use wheelchair at discharge?: (TBD for community use; primary ambulator) Type: Manual Assist Level: Dependent (Pt equals 0%) Assist Level: Dependent (Pt equals 0%) Assist Level: Dependent (Pt equals 0%) Turns around,maneuvers to table,bed, and toilet,negotiates 3% grade,maneuvers on rugs and over doorsills: No Function - Locomotion: Ambulation Assistive device: Cane-quad Max distance: 150' Assist level: Touching or steadying assistance (Pt > 75%) Assist level: Touching or steadying assistance (Pt > 75%) Walk 50 feet with 2 turns activity did not occur:  Safety/medical concerns Assist level: Touching or steadying assistance (Pt > 75%) Walk 150 feet activity did not occur: Safety/medical concerns Assist level: Touching or steadying assistance (Pt > 75%) Walk 10 feet on uneven surfaces activity did not occur: Safety/medical concerns  Function - Comprehension Comprehension: Auditory Comprehension assist level: Understands basic 75 - 89% of the time/ requires cueing 10 - 24% of the time  Function - Expression Expression: Verbal Expression assist level: Expresses basic 75 - 89% of the time/requires cueing 10 - 24% of the time. Needs helper to occlude trach/needs to repeat words.  Function - Social Interaction Social Interaction assist level: Interacts appropriately 75 - 89% of the time - Needs redirection for appropriate language or to initiate interaction.  Function - Problem Solving Problem solving assist level: Solves basic problems with no assist  Function - Memory Memory assist level: Recognizes or recalls 90% of the time/requires cueing < 10% of the time Patient normally able to recall (first 3 days only): Current season, Location of own room, Staff names and faces, That he or she is in a hospital  Medical Problem List and Plan: 1.  Dense left hemiparesis, left facial weakness, left inattention with right gaze preference, diplopia with question of Left HH as well as impulsivity with cognitive deficits secondary to right MCA infarct.  CIR PT OT speech.  2.  DVT Prophylaxis/Anticoagulation: Pharmaceutical: Lovenox. Check dopplers as positive for small PFO.  3. Pain Management: tylenol prn for HA. Left trap myofascial pain inproving with local care 4. Mood: LCSW to follow for evaluation and support as mentation improves.  5. Neuropsych: This patient is not fully  capable of making decisions on her  own behalf. 6. Skin/Wound Care: routine pressure relief measures.  7. Fluids/Electrolytes/Nutrition: Monitor I/O.  8. Chronic pruritis: This  was a problem at home, Cont. topical cream which seems to be helping.  Cont Atarax prn at night 9. New diagnosis T2DM:  Hgb A1C- 7.5--educate patient on CM diet. Will monitor BS ac/hs. May need oral agent.     Recent Labs    03/11/17 1637 03/11/17 2109 03/12/17 0654  GLUCAP 172* 295* 201*  uncontrolled, start glimipride 10. Acute kidney injury: due to poor po intake reported by family --in part due to lethargy. Offer fluids during the day. Encourage intake.  BUN mildly elevated creatinine mildly elevated  intake 240 mL recorded yesterday 11. Constipation:  Start bowel program-- add Miralax daily.  12. Dyslipidemia: On Lipitor.    LOS (Days) 7 A FACE TO FACE EVALUATION WAS PERFORMED  Erick Colace 03/12/2017, 7:50 AM

## 2017-03-12 NOTE — Progress Notes (Signed)
Physical Therapy Session Note  Patient Details  Name: Kristen Short MRN: [id number] Date of Birth: 1965/05/09  Today's Date: 03/12/2017 PT Individual Time: 0900-1000 PT Individual Time Calculation (min): 60 min   Short Term Goals: Week 1:  PT Short Term Goal 1 (Week 1): Pt will perform functional transfers with overall min assist PT Short Term Goal 2 (Week 1): Pt will be able to gait x 50' with min assist with LRAD PT Short Term Goal 3 (Week 1): Pt will be able to go up/down 4 stairs with rail with min assist PT Short Term Goal 4 (Week 1): Pt will demonstrate awareness to LUE placement during mobility 50% of the time during transfers/gait  Skilled Therapeutic Interventions/Progress Updates:    No c/o pain but does c/o pruritis throughout session.  Pt easily distracted throughout session and difficult to redirect with interpreter.  Session focus on balance and mobility with functional ADLs and gait training.    Pt ambulated into bathroom with SBQC and min guard, pt requires max cues for therapist to remain in bathroom for safety during toileting.  Pt able to complete 3/3 toileting steps with min guard and verbal cues to maximize independence in reaching for tissue.  Pt donned shoes with more than a reasonable amount of time, attempted to don socks but pt unable 2/2 distractions from RN and interpreter and poor frustration tolerance.    Gait training with SBQC to therapy gym with close supervision/min guard, no LOB.  NMR for LUE via weight bearing through hand with rotational cup stacking activity in standing at mat.  Attempted quadruped for forced LUE weight bearing but pt c/o pain in knees, also attempted weight bearing through L elbow with lateral lean but pt again refused 2/2 pain.  Provided education via interpreter for pt to attempt NMR activities in order to reduce pain in hemiparetic arm during ambulation with [facility] but no understanding evident.  Transition back to Rw with L hand splint for some  support, pt resistant stating that the hand splint hurt her arm, but PT reinforced need for LUE to be supported during gait.  Pt returned to room at end of session with Rw and min guard with max cues for walker positioning and safety as well as attention to L side.  Positioned upright in w/c with QRB in place, call bell in reach and needs met.   Therapy Documentation Precautions:  Precautions Precautions: Fall, Other (comment) Precaution Comments: L hemiplegia; decreased L attention Restrictions Weight Bearing Restrictions: No   See Function Navigator for Current Functional Status.   Therapy/Group: Individual Therapy  [name] 03/12/2017, 12:28 PM

## 2017-03-12 NOTE — Progress Notes (Signed)
PtOccupational Therapy Session Note  Patient Details  Name: Kristen Short MRN: [id number] Date of Birth: 02-Mar-1965  Today's Date: 03/12/2017 OT Individual Time: 1102-1200 OT Individual Time Calculation (min): 58 min    Short Term Goals: Week 1:  OT Short Term Goal 1 (Week 1): Pt will recall hemi techniques with min questioning cues  OT Short Term Goal 2 (Week 1): Pt will complete toilet transfer with min A OT Short Term Goal 3 (Week 1): Pt will locate 2/4 grooming items located on L side of the sink with min questioning cues.  Skilled Therapeutic Interventions/Progress Updates:    Pt greeted in bed with interpreter present and agreeable to OT. Pt ambulated with RW to bathroom with min A and Mod cues for RW managament to turn and back up to commode. With encouragement, pt able to remove clothing, then sit on commode to void bladder. Pt ambulated to shower after voiding with min guard A for RW management and mod cues for L hand placement on splint. Bathing completed with OT providing hand over hand A to wash body parts for neuro re-ed. Pt needed max multimodal cues to recall hemi dressing techniques for UB, but was eventually able to thread L UE into sleeve today. Facilitated weight bearing through LU E while pt standing at the sink to brush hair with close supervision for balance. Pt returned to bed at end of session and left sitting upright with bed alarm on and needs met.   Therapy Documentation Precautions:  Precautions Precautions: Fall, Other (comment) Precaution Comments: L hemiplegia; decreased L attention Restrictions Weight Bearing Restrictions: No ADL: ADL ADL Comments: Please see functional navigator  See Function Navigator for Current Functional Status.  Therapy/Group: Individual Therapy  Valma Cava 03/12/2017, 12:24 PM

## 2017-03-13 ENCOUNTER — Inpatient Hospital Stay (HOSPITAL_COMMUNITY): Payer: 59 | Admitting: Speech Pathology

## 2017-03-13 ENCOUNTER — Inpatient Hospital Stay (HOSPITAL_COMMUNITY): Payer: 59 | Admitting: *Deleted

## 2017-03-13 ENCOUNTER — Inpatient Hospital Stay (HOSPITAL_COMMUNITY): Payer: 59 | Admitting: Occupational Therapy

## 2017-03-13 LAB — GLUCOSE, CAPILLARY
GLUCOSE-CAPILLARY: 136 mg/dL — AB (ref 65–99)
GLUCOSE-CAPILLARY: 115 mg/dL — AB (ref 65–99)
GLUCOSE-CAPILLARY: 254 mg/dL — AB (ref 65–99)
GLUCOSE-CAPILLARY: 243 mg/dL — AB (ref 65–99)

## 2017-03-13 NOTE — Progress Notes (Signed)
Subjective/Complaints:  Denies Left shoulder pain, used kpad last noc  Review of systems difficult to obtain secondary to language.  She does answer some yes/no questions.  Answers no to breathing problems bowel problems or bladder problems.  Objective: Vital Signs: Blood pressure 126/86, pulse 70, temperature (!) 97.5 F (36.4 C), temperature source Oral, resp. rate 18, weight 75.5 kg (166 lb 7.2 oz), SpO2 100 %. No results found. Results for orders placed or performed during the hospital encounter of 03/05/17 (from the past 72 hour(s))  Glucose, capillary     Status: Abnormal   Collection Time: 03/10/17 11:54 AM  Result Value Ref Range   Glucose-Capillary 143 (H) 65 - 99 mg/dL  Glucose, capillary     Status: Abnormal   Collection Time: 03/10/17  4:32 PM  Result Value Ref Range   Glucose-Capillary 178 (H) 65 - 99 mg/dL  Glucose, capillary     Status: Abnormal   Collection Time: 03/10/17  8:59 PM  Result Value Ref Range   Glucose-Capillary 227 (H) 65 - 99 mg/dL  Glucose, capillary     Status: Abnormal   Collection Time: 03/11/17  6:22 AM  Result Value Ref Range   Glucose-Capillary 275 (H) 65 - 99 mg/dL  Glucose, capillary     Status: Abnormal   Collection Time: 03/11/17 11:56 AM  Result Value Ref Range   Glucose-Capillary 160 (H) 65 - 99 mg/dL  Glucose, capillary     Status: Abnormal   Collection Time: 03/11/17  4:37 PM  Result Value Ref Range   Glucose-Capillary 172 (H) 65 - 99 mg/dL  Glucose, capillary     Status: Abnormal   Collection Time: 03/11/17  9:09 PM  Result Value Ref Range   Glucose-Capillary 295 (H) 65 - 99 mg/dL  Glucose, capillary     Status: Abnormal   Collection Time: 03/12/17  6:54 AM  Result Value Ref Range   Glucose-Capillary 201 (H) 65 - 99 mg/dL  Basic metabolic panel     Status: Abnormal   Collection Time: 03/12/17  8:10 AM  Result Value Ref Range   Sodium 138 135 - 145 mmol/L   Potassium 4.0 3.5 - 5.1 mmol/L   Chloride 106 101 - 111 mmol/L    CO2 21 (L) 22 - 32 mmol/L   Glucose, Bld 205 (H) 65 - 99 mg/dL   BUN 14 6 - 20 mg/dL   Creatinine, Ser 0.97 0.44 - 1.00 mg/dL   Calcium 9.7 8.9 - 10.3 mg/dL   GFR calc non Af Amer >60 >60 mL/min   GFR calc Af Amer >60 >60 mL/min    Comment: (NOTE) The eGFR has been calculated using the CKD EPI equation. This calculation has not been validated in all clinical situations. eGFR's persistently <60 mL/min signify possible Chronic Kidney Disease.    Anion gap 11 5 - 15    Comment: Performed at [facility] [address] [place], [place] [address]  CBC     Status: Abnormal   Collection Time: 03/12/17  8:10 AM  Result Value Ref Range   WBC 7.1 4.0 - 10.5 K/uL   RBC 5.77 (H) 3.87 - 5.11 MIL/uL   Hemoglobin 14.3 12.0 - 15.0 g/dL   HCT 44.2 36.0 - 46.0 %   MCV 76.6 (L) 78.0 - 100.0 fL   MCH 24.8 (L) 26.0 - 34.0 pg   MCHC 32.4 30.0 - 36.0 g/dL   RDW 14.2 11.5 - 15.5 %   Platelets 191 150 -  400 K/uL    Comment: Performed at [facility], [place] [address], [place], [place] [address]  Glucose, capillary     Status: Abnormal   Collection Time: 03/12/17 11:46 AM  Result Value Ref Range   Glucose-Capillary 117 (H) 65 - 99 mg/dL  Glucose, capillary     Status: Abnormal   Collection Time: 03/12/17  5:00 PM  Result Value Ref Range   Glucose-Capillary 164 (H) 65 - 99 mg/dL  Glucose, capillary     Status: Abnormal   Collection Time: 03/12/17  9:24 PM  Result Value Ref Range   Glucose-Capillary 253 (H) 65 - 99 mg/dL  Glucose, capillary     Status: Abnormal   Collection Time: 03/13/17  6:46 AM  Result Value Ref Range   Glucose-Capillary 136 (H) 65 - 99 mg/dL     HEENT: normal Cardio: RRR and No murmurs or extra sounds Resp: CTA B/L and Unlabored GI: BS positive and Nontender and nondistended Extremity:  Pulses positive and No Edema Skin:   Other Loop recorder site clean dry and intact Neuro: Lethargic, Flat, Abnormal Sensory Difficult to assess sensation secondary to  language., Abnormal Motor 0/5 in the triceps grip 3- in the left hip flexion as well as knee extension hip extension synergy, 2- at the ankle DF/PF, TE/TF Musc/Skel:  Swelling Left calf with mild tenderness to palpation.  No evidence of knee effusion or left thigh swelling General no acute distress   Assessment/Plan: 1. 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 Physiatrist is providing close team supervision and 24 hour management of active medical problems listed below. Physiatrist and rehab team continue to assess barriers to discharge/monitor patient progress toward functional and medical goals. FIM: Function - Bathing Position: Shower Body parts bathed by patient: Left arm, Chest, Abdomen, Front perineal area, Right upper leg, Left upper leg, Right lower leg, Left lower leg, Buttocks Body parts bathed by helper: Right arm Assist Level: Touching or steadying assistance(Pt > 75%)  Function- Upper Body Dressing/Undressing What is the patient wearing?: Pull over shirt/dress Pull over shirt/dress - Perfomed by patient: Thread/unthread right sleeve, Thread/unthread left sleeve, Put head through opening Pull over shirt/dress - Perfomed by helper: Pull shirt over trunk Assist Level: Touching or steadying assistance(Pt > 75%) Function - Lower Body Dressing/Undressing What is the patient wearing?: Pants, Non-skid slipper socks Position: Wheelchair/chair at sink Underwear - Performed by patient: Thread/unthread left underwear leg, Thread/unthread right underwear leg Underwear - Performed by helper: Pull underwear up/down Pants- Performed by patient: Thread/unthread left pants leg, Thread/unthread right pants leg, Pull pants up/down Pants- Performed by helper: Pull pants up/down Non-skid slipper socks- Performed by patient: Don/doff right  sock, Don/doff left sock Non-skid slipper socks- Performed by helper: Don/doff left sock Assist for footwear: Supervision/touching assist Assist for lower body dressing: Touching or steadying assistance (Pt > 75%)  Function - Toileting Toileting steps completed by patient: Performs perineal hygiene Toileting steps completed by helper: Adjust clothing prior to toileting, Adjust clothing after toileting Toileting Assistive Devices: Grab bar or rail Assist level: Touching or steadying assistance (Pt.75%)  Function - Air cabin crew transfer activity did not occur: Safety/medical concerns Toilet transfer assistive device: Grab bar Assist level to toilet: Touching or steadying assistance (Pt > 75%) Assist level from toilet: Touching or steadying assistance (Pt > 75%)  Function - Chair/bed transfer Chair/bed transfer activity did  not occur: Safety/medical concerns Chair/bed transfer method: Ambulatory, Stand pivot Chair/bed transfer assist level: Touching or steadying assistance (Pt > 75%) Chair/bed transfer assistive device: Armrests, Cane Chair/bed transfer details: Verbal cues for precautions/safety, Verbal cues for safe use of DME/AE  Function - Locomotion: Wheelchair Will patient use wheelchair at discharge?: (TBD for community use; primary ambulator) Type: Manual Assist Level: Dependent (Pt equals 0%) Assist Level: Dependent (Pt equals 0%) Assist Level: Dependent (Pt equals 0%) Turns around,maneuvers to table,bed, and toilet,negotiates 3% grade,maneuvers on rugs and over doorsills: No Function - Locomotion: Ambulation Assistive device: Cane-quad Max distance: 150' Assist level: Touching or steadying assistance (Pt > 75%) Assist level: Touching or steadying assistance (Pt > 75%) Walk 50 feet with 2 turns activity did not occur: Safety/medical concerns Assist level: Touching or steadying assistance (Pt > 75%) Walk 150 feet activity did not occur: Safety/medical  concerns Assist level: Touching or steadying assistance (Pt > 75%) Walk 10 feet on uneven surfaces activity did not occur: Safety/medical concerns  Function - Comprehension Comprehension: Auditory Comprehension assist level: Understands basic 75 - 89% of the time/ requires cueing 10 - 24% of the time  Function - Expression Expression: Verbal Expression assist level: Expresses basic 75 - 89% of the time/requires cueing 10 - 24% of the time. Needs helper to occlude trach/needs to repeat words.  Function - Social Interaction Social Interaction assist level: Interacts appropriately 75 - 89% of the time - Needs redirection for appropriate language or to initiate interaction.  Function - Problem Solving Problem solving assist level: Solves basic 75 - 89% of the time/requires cueing 10 - 24% of the time  Function - Memory Memory assist level: Recognizes or recalls 75 - 89% of the time/requires cueing 10 - 24% of the time Patient normally able to recall (first 3 days only): Current season, Location of own room, Staff names and faces, That he or she is in a hospital  Medical Problem List and Plan: 1.  Dense left hemiparesis, left facial weakness, left inattention with right gaze preference, diplopia with question of Left HH as well as impulsivity with cognitive deficits secondary to right MCA infarct.  CIR PT OT speech. Team conf in am 2.  DVT Prophylaxis/Anticoagulation: Pharmaceutical: Lovenox. Check dopplers as positive for small PFO.  3. Pain Management: tylenol prn for HA. Left trap myofascial pain inproving with local care 4. Mood: LCSW to follow for evaluation and support as mentation improves.  5. Neuropsych: This patient is not fully  capable of making decisions on her  own behalf. 6. Skin/Wound Care: routine pressure relief measures.  7. Fluids/Electrolytes/Nutrition: Monitor I/O.  8. Chronic pruritis: This was a problem at home, Cont. topical cream which seems to be helping.  Cont  Atarax prn at night 9. New diagnosis T2DM:  Hgb A1C- 7.5--educate patient on CM diet. Will monitor BS ac/hs. May need oral agent.     Recent Labs    03/12/17 1700 03/12/17 2124 03/13/17 0646  GLUCAP 164* 253* 136*  uncontrolled, started glimipride 2/4, monitor response 10. Acute kidney injury: due to poor po intake reported by family --in part due to lethargy. Offer fluids during the day. Encourage intake.  BUN mildly elevated creatinine mildly elevated  intake 600 mL recorded yesterday 11. Constipation:  Start bowel program-- add Miralax daily.  12. Dyslipidemia: On Lipitor.    LOS (Days) 8 A FACE TO FACE EVALUATION WAS PERFORMED  Charlett Blake 03/13/2017, 7:44 AM

## 2017-03-13 NOTE — Progress Notes (Signed)
Speech Language Pathology Weekly Progress and Session Note  Patient Details  Name: Kristen Short MRN: [id number] Date of Birth: 1965/10/06  Beginning of progress report period: March 06, 2017 End of progress report period: March 13, 2017  Today's Date: 03/13/2017 SLP Individual Time: 1300-1400 SLP Individual Time Calculation (min): 60 min  Short Term Goals: Week 1: SLP Short Term Goal 1 (Week 1): Patient will demonstrate efficient mastication and complete oral clearance with trials of regular textures over 2 sessions prior to upgrade with supervision verbal cues.  SLP Short Term Goal 1 - Progress (Week 1): Not met SLP Short Term Goal 2 (Week 1): Patient will identify 2 physical and 2 cognitive impairments with Min A verbal cues.  SLP Short Term Goal 2 - Progress (Week 1): Not met SLP Short Term Goal 3 (Week 1): Patient will demonstrate functional problem solving for mildly complex and familiar tasks with Min A verbal cues.  SLP Short Term Goal 3 - Progress (Week 1): Not met SLP Short Term Goal 4 (Week 1): Patient will attend to left field of enviornment during tasks with Mod A verbal and visual cues.  SLP Short Term Goal 4 - Progress (Week 1): Met SLP Short Term Goal 5 (Week 1): Patient will demonstrate selective attention to functional tasks in a mildly distracting enviornment for 30 minutes with Min A verbal cues for redirection.  SLP Short Term Goal 5 - Progress (Week 1): Met SLP Short Term Goal 6 (Week 1): Patient will recall new, daily information with Min A verbal and visual cues.  SLP Short Term Goal 6 - Progress (Week 1): Not met    New Short Term Goals: Week 2: SLP Short Term Goal 1 (Week 2): Patient will demonstrate efficient mastication and complete oral clearance with trials of regular textures over 2 sessions prior to upgrade with supervision verbal cues.  SLP Short Term Goal 2 (Week 2): Patient will identify 2 physical and 2 cognitive impairments with Min A verbal cues.   SLP Short Term Goal 3 (Week 2): Patient will demonstrate functional problem solving for mildly complex and familiar tasks with Min A verbal cues.  SLP Short Term Goal 4 (Week 2): Patient will attend to left field of enviornment during tasks with Min A verbal and visual cues.  SLP Short Term Goal 5 (Week 2): Patient will demonstrate selective attention to functional tasks in a mildly distracting enviornment for 45 minutes with Min A verbal cues for redirection.  SLP Short Term Goal 6 (Week 2): Patient will recall new, daily information with Min A verbal and visual cues.   Weekly Progress Updates: Patient has made functional gains and has met 2 out of 6 STGs this reporting period. Currently, pt requires overall Min A verbal cues for selective attention to functional tasks in a mildly distracting environment for ~30 minutes and Mod A verbal cues for attention to left field of environment during tasks. Pt demonstrates efficient mastication of meals of D3 textures with thin liquids without overt s/s of aspiration and Mod I for use of swallowing compensatory strategies; however, patient has been declining trials of regular textures. Pt continues to require Mod - Max A verbal cues for problem solving with mildly complex tasks and Mod A verbal cues for recall of new information. Patient and family education is ongoing. Patient would benefit from continued skilled SLP intervention to maximize her cognitive function and overall functional independence prior to discharge.    Intensity: Minumum of 1-2 x/day, 30 to 90  minutes Frequency: 3 to 5 out of 7 days Duration/Length of Stay: 03/20/2017 Treatment/Interventions: Cognitive remediation/compensation;Environmental controls;Internal/external aids;Therapeutic Activities;Patient/family education;Functional tasks;Dysphagia/aspiration precaution training;Cueing hierarchy   Daily Session  Skilled Therapeutic Interventions: Skilled treatment session focused on  cognitive goals. SLP facilitated session by providing skilled observation of pt consuming lunch of D3 textures with thin liquids. Patient consumed meal without overt s/s of aspiration and was Mod I for use of swallowing compensatory strategies. Therefore, recommend patient only require intermittent supervision with meals at this time. SLP further facilitated session by providing Mod A verbal cues for problem solving during a novel, mildly complex card task (Blink). Pt demonstrated selective attention in a slightly distractive environment for ~30 minutes with Min A verbal cues for redirection. Pt required Min A verbal cues for attention to left field of environment throughout task. Pt required Mod A verbal cues for problem solving with transfer from wheelchair to bed due to impulsivity. Pt left supine in bed with alarm on and all needs within reach. Continue with current plan of care.       Function:   Eating Eating   Modified Consistency Diet: Yes Eating Assist Level: No help, No cues;Set up assist for   Eating Set Up Assist For: Opening containers       Cognition Comprehension Comprehension assist level: Understands basic 75 - 89% of the time/ requires cueing 10 - 24% of the time  Expression   Expression assist level: Expresses basic 75 - 89% of the time/requires cueing 10 - 24% of the time. Needs helper to occlude trach/needs to repeat words.  Social Interaction Social Interaction assist level: Interacts appropriately 75 - 89% of the time - Needs redirection for appropriate language or to initiate interaction.  Problem Solving Problem solving assist level: Solves basic 50 - 74% of the time/requires cueing 25 - 49% of the time  Memory Memory assist level: Recognizes or recalls 50 - 74% of the time/requires cueing 25 - 49% of the time    Pain Pain Assessment Pain Assessment: No/denies pain   Therapy/Group: Individual Therapy  Meredeth Ide  SLP - Student 03/13/2017, 3:39 PM

## 2017-03-13 NOTE — Progress Notes (Signed)
Physical Therapy Weekly Progress Note  Patient Details  Name: Kristen Short MRN: [id number] Date of Birth: 08-Nov-1965  Beginning of progress report period: March 06, 2017 End of progress report period: March 13, 2017  Today's Date: 03/13/2017 PT Individual Time: 1000-1100 PT Individual Time Calculation (min): 60 min   Patient has met 3 of 4 short term goals.  Pt has made progress towards mobility goals and is currently performing mobility with supervision 50% of the time.  She continues to be limited by language barrier and significant cognitive/safety awareness deficits which greatly increase her risk of falling in her home environment.  Will benefit from family education sessions prior to d/c.  Patient continues to demonstrate the following deficits muscle weakness and muscle paralysis, impaired timing and sequencing, abnormal tone and decreased coordination, decreased attention to left, decreased attention, decreased awareness, decreased problem solving, decreased safety awareness, decreased memory and delayed processing and hemiplegia and decreased balance strategies and therefore will continue to benefit from skilled PT intervention to increase functional independence with mobility.  Patient progressing toward long term goals..  Continue plan of care.  PT Short Term Goals Week 1:  PT Short Term Goal 1 (Week 1): Pt will perform functional transfers with overall min assist PT Short Term Goal 1 - Progress (Week 1): Met PT Short Term Goal 2 (Week 1): Pt will be able to gait x 50' with min assist with LRAD PT Short Term Goal 2 - Progress (Week 1): Met PT Short Term Goal 3 (Week 1): Pt will be able to go up/down 4 stairs with rail with min assist PT Short Term Goal 3 - Progress (Week 1): Met PT Short Term Goal 4 (Week 1): Pt will demonstrate awareness to LUE placement during mobility 50% of the time during transfers/gait PT Short Term Goal 4 - Progress (Week 1): Not met Week 2:  PT Short Term  Goal 1 (Week 2): =LTGs due to ELOS  Skilled Therapeutic Interventions/Progress Updates:    no c/o pain at rest.  Session focus on attempts to incorporate LUE into functional mobility, activity tolerance, falls recovery, and pt education.    Pt educated throughout session on importance of supporting LUE during ambulation to reduce risk of subluxation, but does not appear there was any understanding of information, even with use of interpreter.  Pt continues to drag RW with her L hand in the hand splint and her RUE reaching for furniture to steady herself on.  Would likely be safest at home with a quad cane and a LUE sling for support.    Gait within room with RW, min assist for balance, but max/total cues for walker positioning throughout.  Pt completed 3/3 toilet steps with supervision.  Pt engaged in dish washing task in therapy apartment focus on equal weightbearing through LEs, and LUE weightbearing on sink in standing.  Pt requires manual facilitation for L weight shift and for weight bearing through LUE.    Practiced floor transfer with pillows to protect knees 2/2 pt c/o pain with weightbearing through knees.  Pt requires min assist to transition from half kneeling to standing.  Discussed falls risk and falls recovery via interpreter, pt indicates understanding, but would benefit from education with family as well to ensure maximum carryover.  Pt returned to room at end of session and positioned upright in w/c with QRB in place to await OT.   Therapy Documentation Precautions:  Precautions Precautions: Fall, Other (comment) Precaution Comments: L hemiplegia; decreased L attention Restrictions  Weight Bearing Restrictions: No   See Function Navigator for Current Functional Status.  Therapy/Group: Individual Therapy  [name] 03/13/2017, 12:16 PM

## 2017-03-13 NOTE — Progress Notes (Signed)
Occupational Therapy Weekly Progress Note  Patient Details  Name: Kristen Short MRN: [id number] Date of Birth: [birth year]  Beginning of progress report period: March 06, 2017 End of progress report period: March 13, 2017  Today's Date: 03/13/2017 OT Individual Time: 1100-1200 OT Individual Time Calculation (min): 60 min   Patient has met 3 of 3 short term goals.  Pt is making steady progress with OT treatments at this time.  She is able to complete all transfers with use of the quad cane and min guard assist.  LUE function continues to improve with improved proximal shoulder activation.   Patient continues to demonstrate the following deficits: muscle weakness, abnormal tone, decreased coordination and decreased motor planning, decreased attention to left and decreased motor planning and decreased standing balance, decreased postural control, hemiplegia and decreased balance strategies and therefore will continue to benefit from skilled OT intervention to enhance overall performance with BADL.  Patient progressing toward long term goals..  Continue plan of care.  OT Short Term Goals Week 1:  OT Short Term Goal 1 (Week 1): Pt will recall hemi techniques with min questioning cues  OT Short Term Goal 1 - Progress (Week 1): Met OT Short Term Goal 2 (Week 1): Pt will complete toilet transfer with min A OT Short Term Goal 2 - Progress (Week 1): Met OT Short Term Goal 3 (Week 1): Pt will locate 2/4 grooming items located on L side of the sink with min questioning cues. OT Short Term Goal 3 - Progress (Week 1): Met Week 2:  OT Short Term Goal 1 (Week 2): STG=LTG 2/2 ELOS  Skilled Therapeutic Interventions/Progress Updates:    OT treatment session focused on L UE NMR. Towel pushes with raised mat with improved scap and shoulder activation. Crawling on therapy mat with OT providing L UE stabilization. Facilitated weight bearing through L UE while stacking cups with R hand in quadruped. Progressed to  tall kneeling cup stacking using L UE. Pt able to maintain grasp on cups 2/2 tone, then actively move L UE towards cups on R side while OT provided guided A for wist pronation/supination and release of cups. Weight bearing through L UE in long sitting, while reaching across body to stack cones L and R. 1:1 NMES applied to supraspinatus and middle deltoid to help approximate shoulder joint to reduce sublux and reduce pain, CH 2 wrist extensors  Ratio 1:3 Rate 35 pps Waveform- Asymmetric Ramp 1.0 Pulse 300 Intensity- 15   Duration -   20  Report of pain at the beginning of session 4 Report of pain at the end of session 3  No adverse reactions after treatment and is skin intact.   Therapy Documentation Precautions:  Precautions Precautions: Fall, Other (comment) Precaution Comments: L hemiplegia; decreased L attention Restrictions Weight Bearing Restrictions: No Pain: Pain Assessment Pain Assessment: 0-10 Pain Score: 3  Pain Type: Acute pain Pain Location: Arm Pain Orientation: Left Pain Descriptors / Indicators: Aching Pain Onset: Gradual Pain Intervention(s): Repositioned ADL: ADL ADL Comments: Please see functional navigator  See Function Navigator for Current Functional Status.  Therapy/Group: Individual Therapy  Valma Cava 03/13/2017, 12:59 PM

## 2017-03-13 NOTE — Progress Notes (Signed)
Occupational Therapy Session Note  Patient Details  Name: Kristen Short MRN: [id number] Date of Birth: [birth year]  Today's Date: 03/13/2017 OT Individual Time: 1001-1100 OT Individual Time Calculation (min): 59 min    Short Term Goals: Week 1:  OT Short Term Goal 1 (Week 1): Pt will recall hemi techniques with min questioning cues  OT Short Term Goal 2 (Week 1): Pt will complete toilet transfer with min A OT Short Term Goal 3 (Week 1): Pt will locate 2/4 grooming items located on L side of the sink with min questioning cues.  Skilled Therapeutic Interventions/Progress Updates:    OT treatment session focused on pt/family education. Pt and 2 daughters greeted in therapy apartment. Reviewed home measurement sheet and pictures family took of bathrooms. Discussed home modifications and ways to decrease risk of falls. Set-up walk-in shower simulated to home environment. Then practiced side-stepping in and out of shower using RW. Pt needed mod A with intermittent Max A to correct 2 lateral LOBs to the L. Discussed other options for bathroom shower including tub bench option which will likely be safest option. Practiced with each daughter side-stepping to L and R with pt needing min A to side-step to R and Mod A to side-step to L. Discussed how to cue pt for body positioning and RW management. Practiced tub bench transfer with pt, and he was able to lift B LEs into tub with supervision. Pt returned to room and daughter assisted with transfer onto commode with min A. Pt left seated on commode with daughters present and needs met.  Therapy Documentation Precautions:  Precautions Precautions: Fall, Other (comment) Precaution Comments: L hemiplegia; decreased L attention Restrictions Weight Bearing Restrictions: No Pain:  none/denies pain ADL: ADL ADL Comments: Please see functional navigator Other Treatments:    See Function Navigator for Current Functional Status.   Therapy/Group: Individual  Therapy  Valma Cava 03/13/2017, 11:00 AM

## 2017-03-13 NOTE — Progress Notes (Signed)
Recreational Therapy Session Note  Patient Details  Name: Patsy BaltimoreHwoc Yarrow MRN: [id number] Date of Birth: 1966/01/21 Today's Date: 03/13/2017  Pain: no c/o Skilled Therapeutic Interventions/Progress Updates: Session focused on activity tolerance, standing tolerance, WBing through LUE/LLE during kitchen task washing dishes as pt engaged in kitchen task well during previous TR session last week.  Pt required tactile and verbal cues for safety throughout session and min assist for balance.- Co-treatment with PT  Quintavius Niebuhr 03/13/2017, 4:22 PM

## 2017-03-14 ENCOUNTER — Inpatient Hospital Stay (HOSPITAL_COMMUNITY): Payer: 59

## 2017-03-14 ENCOUNTER — Inpatient Hospital Stay (HOSPITAL_COMMUNITY): Payer: 59 | Admitting: Speech Pathology

## 2017-03-14 ENCOUNTER — Inpatient Hospital Stay (HOSPITAL_COMMUNITY): Payer: 59 | Admitting: Occupational Therapy

## 2017-03-14 ENCOUNTER — Inpatient Hospital Stay (HOSPITAL_COMMUNITY): Payer: 59 | Admitting: Physical Therapy

## 2017-03-14 LAB — GLUCOSE, CAPILLARY
GLUCOSE-CAPILLARY: 160 mg/dL — AB (ref 65–99)
GLUCOSE-CAPILLARY: 113 mg/dL — AB (ref 65–99)
GLUCOSE-CAPILLARY: 293 mg/dL — AB (ref 65–99)
Glucose-Capillary: 140 mg/dL — ABNORMAL HIGH (ref 65–99)

## 2017-03-14 NOTE — Progress Notes (Signed)
Subjective/Complaints:  No issues overnite  Review of systems difficult to obtain secondary to language.  She does answer some yes/no questions but not accurate states yes to eating breakfast but tray untouched  Objective: Vital Signs: Blood pressure (!) 104/41, pulse 82, temperature 97.6 F (36.4 C), temperature source Oral, resp. rate 18, weight 75.5 kg (166 lb 7.2 oz), SpO2 100 %. No results found. Results for orders placed or performed during the hospital encounter of 03/05/17 (from the past 72 hour(s))  Glucose, capillary     Status: Abnormal   Collection Time: 03/11/17 11:56 AM  Result Value Ref Range   Glucose-Capillary 160 (H) 65 - 99 mg/dL  Glucose, capillary     Status: Abnormal   Collection Time: 03/11/17  4:37 PM  Result Value Ref Range   Glucose-Capillary 172 (H) 65 - 99 mg/dL  Glucose, capillary     Status: Abnormal   Collection Time: 03/11/17  9:09 PM  Result Value Ref Range   Glucose-Capillary 295 (H) 65 - 99 mg/dL  Glucose, capillary     Status: Abnormal   Collection Time: 03/12/17  6:54 AM  Result Value Ref Range   Glucose-Capillary 201 (H) 65 - 99 mg/dL  Basic metabolic panel     Status: Abnormal   Collection Time: 03/12/17  8:10 AM  Result Value Ref Range   Sodium 138 135 - 145 mmol/L   Potassium 4.0 3.5 - 5.1 mmol/L   Chloride 106 101 - 111 mmol/L   CO2 21 (L) 22 - 32 mmol/L   Glucose, Bld 205 (H) 65 - 99 mg/dL   BUN 14 6 - 20 mg/dL   Creatinine, Ser 0.97 0.44 - 1.00 mg/dL   Calcium 9.7 8.9 - 10.3 mg/dL   GFR calc non Af Amer >60 >60 mL/min   GFR calc Af Amer >60 >60 mL/min    Comment: (NOTE) The eGFR has been calculated using the CKD EPI equation. This calculation has not been validated in all clinical situations. eGFR's persistently <60 mL/min signify possible Chronic Kidney Disease.    Anion gap 11 5 - 15    Comment: Performed at [facility] [address], [place], [place] [address]  CBC     Status: Abnormal   Collection Time:  03/12/17  8:10 AM  Result Value Ref Range   WBC 7.1 4.0 - 10.5 K/uL   RBC 5.77 (H) 3.87 - 5.11 MIL/uL   Hemoglobin 14.3 12.0 - 15.0 g/dL   HCT 44.2 36.0 - 46.0 %   MCV 76.6 (L) 78.0 - 100.0 fL   MCH 24.8 (L) 26.0 - 34.0 pg   MCHC 32.4 30.0 - 36.0 g/dL   RDW 14.2 11.5 - 15.5 %   Platelets 191 150 - 400 K/uL    Comment: Performed at [facility], [place] [address] [place], [place] [address]  Glucose, capillary     Status: Abnormal   Collection Time: 03/12/17 11:46 AM  Result Value Ref Range   Glucose-Capillary 117 (H) 65 - 99 mg/dL  Glucose, capillary     Status: Abnormal   Collection Time: 03/12/17  5:00 PM  Result Value Ref Range   Glucose-Capillary 164 (H) 65 - 99 mg/dL  Glucose, capillary     Status: Abnormal   Collection Time: 03/12/17  9:24 PM  Result Value Ref Range   Glucose-Capillary 253 (H) 65 - 99 mg/dL  Glucose, capillary     Status: Abnormal   Collection Time: 03/13/17  6:46 AM  Result Value Ref Range   Glucose-Capillary 136 (H) 65 - 99 mg/dL  Glucose, capillary     Status: Abnormal   Collection Time: 03/13/17 12:16 PM  Result Value Ref Range   Glucose-Capillary 115 (H) 65 - 99 mg/dL  Glucose, capillary     Status: Abnormal   Collection Time: 03/13/17  4:38 PM  Result Value Ref Range   Glucose-Capillary 254 (H) 65 - 99 mg/dL   Comment 1 Notify RN   Glucose, capillary     Status: Abnormal   Collection Time: 03/13/17  9:31 PM  Result Value Ref Range   Glucose-Capillary 243 (H) 65 - 99 mg/dL  Glucose, capillary     Status: Abnormal   Collection Time: 03/14/17  6:27 AM  Result Value Ref Range   Glucose-Capillary 140 (H) 65 - 99 mg/dL     HEENT: normal Cardio: RRR and No murmurs or extra sounds Resp: CTA B/L and Unlabored GI: BS positive and Nontender and nondistended Extremity:  Pulses positive and No Edema Skin:   Other Loop recorder site clean dry and intact Neuro: Lethargic, Flat, Abnormal Sensory Difficult to assess sensation secondary to language.,  Abnormal Motor 0/5 in the triceps grip 3- in the left hip flexion as well as knee extension hip extension synergy, 2- at the ankle DF/PF, TE/TF Musc/Skel:  Swelling Left calf with mild tenderness to palpation.  No evidence of knee effusion or left thigh swelling General no acute distress   Assessment/Plan: 1. 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 Physiatrist is providing close team supervision and 24 hour management of active medical problems listed below. Physiatrist and rehab team continue to assess barriers to discharge/monitor patient progress toward functional and medical goals. FIM: Function - Bathing Position: Shower Body parts bathed by patient: Left arm, Chest, Abdomen, Front perineal area, Right upper leg, Left upper leg, Right lower leg, Left lower leg, Buttocks Body parts bathed by helper: Right arm Assist Level: Touching or steadying assistance(Pt > 75%)  Function- Upper Body Dressing/Undressing What is the patient wearing?: Pull over shirt/dress Pull over shirt/dress - Perfomed by patient: Thread/unthread right sleeve, Thread/unthread left sleeve, Put head through opening Pull over shirt/dress - Perfomed by helper: Pull shirt over trunk Assist Level: Touching or steadying assistance(Pt > 75%) Function - Lower Body Dressing/Undressing What is the patient wearing?: Pants, Non-skid slipper socks Position: Wheelchair/chair at sink Underwear - Performed by patient: Thread/unthread left underwear leg, Thread/unthread right underwear leg Underwear - Performed by helper: Pull underwear up/down Pants- Performed by patient: Thread/unthread left pants leg, Thread/unthread right pants leg, Pull pants up/down Pants- Performed by helper: Pull pants up/down Non-skid slipper socks- Performed by patient: Don/doff right sock, Don/doff  left sock Non-skid slipper socks- Performed by helper: Don/doff left sock Assist for footwear: Supervision/touching assist Assist for lower body dressing: Touching or steadying assistance (Pt > 75%)  Function - Toileting Toileting steps completed by patient: Adjust clothing prior to toileting, Performs perineal hygiene, Adjust clothing after toileting Toileting steps completed by helper: Adjust clothing prior to toileting, Adjust clothing after toileting Toileting Assistive Devices: Grab bar or rail Assist level: Supervision or verbal cues  Function - Air cabin crew transfer activity did not occur: Safety/medical concerns Toilet transfer assistive device: Grab bar Assist level to toilet: Touching or steadying assistance (Pt > 75%) Assist level from toilet: Touching or steadying assistance (Pt > 75%)  Function - Chair/bed transfer Chair/bed transfer activity did not occur: Safety/medical concerns Chair/bed transfer method: Ambulatory, Stand pivot Chair/bed transfer assist level: Touching or steadying assistance (Pt > 75%) Chair/bed transfer assistive device: Armrests, Cane Chair/bed transfer details: Verbal cues for precautions/safety, Verbal cues for safe use of DME/AE  Function - Locomotion: Wheelchair Will patient use wheelchair at discharge?: (TBD for community use; primary ambulator) Type: Manual Assist Level: Dependent (Pt equals 0%) Assist Level: Dependent (Pt equals 0%) Assist Level: Dependent (Pt equals 0%) Turns around,maneuvers to table,bed, and toilet,negotiates 3% grade,maneuvers on rugs and over doorsills: No Function - Locomotion: Ambulation Assistive device: Cane-quad Max distance: 150' Assist level: Touching or steadying assistance (Pt > 75%) Assist level: Touching or steadying assistance (Pt > 75%) Walk 50 feet with 2 turns activity did not occur: Safety/medical concerns Assist level: Touching or steadying assistance (Pt > 75%) Walk 150 feet activity  did not occur: Safety/medical concerns Assist level: Touching or steadying assistance (Pt > 75%) Walk 10 feet on uneven surfaces activity did not occur: Safety/medical concerns  Function - Comprehension Comprehension: Auditory Comprehension assist level: Understands basic 75 - 89% of the time/ requires cueing 10 - 24% of the time  Function - Expression Expression: Verbal Expression assist level: Expresses basic 75 - 89% of the time/requires cueing 10 - 24% of the time. Needs helper to occlude trach/needs to repeat words.  Function - Social Interaction Social Interaction assist level: Interacts appropriately 75 - 89% of the time - Needs redirection for appropriate language or to initiate interaction.  Function - Problem Solving Problem solving assist level: Solves basic 50 - 74% of the time/requires cueing 25 - 49% of the time  Function - Memory Memory assist level: Recognizes or recalls 50 - 74% of the time/requires cueing 25 - 49% of the time Patient normally able to recall (first 3 days only): Current season, Location of own room, Staff names and faces, That he or she is in a hospital  Medical Problem List and Plan: 1.  Dense left hemiparesis, left facial weakness, left inattention with right gaze preference, diplopia with question of Left HH as well as impulsivity with cognitive deficits secondary to right MCA infarct.  CIR PT OT speech. Team conference today please see physician documentation under team conference tab, met with team face-to-face to discuss problems,progress, and goals. Formulized individual treatment plan based on medical history, underlying problem and comorbidities. 2.  DVT Prophylaxis/Anticoagulation: Pharmaceutical: Lovenox. Check dopplers as positive for small PFO.  3. Pain Management: tylenol prn for HA. Left trap myofascial pain inproving with local care 4. Mood: LCSW to follow for evaluation and support as mentation improves.  5. Neuropsych: This patient is not  fully  capable of making decisions on her  own behalf. 6. Skin/Wound Care: routine pressure relief measures.  7. Fluids/Electrolytes/Nutrition: Monitor I/O.  8. Chronic pruritis: This was a problem at home, Cont. topical cream which seems to be helping.  Cont Atarax prn at night 9. New diagnosis T2DM:  Hgb A1C- 7.5--educate patient on CM diet. Will monitor BS ac/hs. May need oral agent.     Recent Labs    03/13/17 1638 03/13/17 2131 03/14/17 0627  GLUCAP 254* 243* 140*  uncontrolled, started glimipride 2/4, will increase to 91m 10. Acute kidney injury: due to poor po intake reported by family --in part due to lethargy. Offer fluids during the day. Encourage intake.  BUN mildly elevated creatinine mildly elevated  intake 480 mL recorded yesterday 11. Constipation:  Start bowel  program-- add Miralax daily.  12. Dyslipidemia: On Lipitor.    LOS (Days) 9 A FACE TO FACE EVALUATION WAS PERFORMED  Charlett Blake 03/14/2017, 8:45 AM

## 2017-03-14 NOTE — Progress Notes (Signed)
Physical Therapy Session Note  Patient Details  Name: Kristen Short MRN: [id number] Date of Birth: [birth year]  Today's Date: 03/14/2017 PT Individual Time: 1130-1200 PT Individual Time Calculation (min): 30 min   Short Term Goals: Week 1:  PT Short Term Goal 1 (Week 1): Pt will perform functional transfers with overall min assist PT Short Term Goal 1 - Progress (Week 1): Met PT Short Term Goal 2 (Week 1): Pt will be able to gait x 50' with min assist with LRAD PT Short Term Goal 2 - Progress (Week 1): Met PT Short Term Goal 3 (Week 1): Pt will be able to go up/down 4 stairs with rail with min assist PT Short Term Goal 3 - Progress (Week 1): Met PT Short Term Goal 4 (Week 1): Pt will demonstrate awareness to LUE placement during mobility 50% of the time during transfers/gait PT Short Term Goal 4 - Progress (Week 1): Not met  Skilled Therapeutic Interventions/Progress Updates:    no c/o pain.  Session focus on d/c planning, stair negotiation, obstacle negotiation, and L attention.  Pt continues to require max/total cues for attention to L environment during functional mobility and path finding tasks.    PT provided education with SW at start of session for updated d/c date and need for family education prior to d/c with primary caregiver.  Pt ambulates to and from therapy gym with [facility] and close supervision.  Stair negotiation 2x4 steps with R ascending rail, min guard for safety and mod cues for sequencing leading LE for ascent and descent as well as total cues to turn and come back down the same side of the stairs.  Pt completed 2 trials through obstacle course focus on obstacle negotiation with [facility] and max multimodal cues for attention.   On return to pt's room, focus on path finding, pt requires total cues to locate room, with frequent attempts to turn R into staff rooms and other patient rooms, and when positioned in front of 4w17 and 4w19 she could not locate 4w17 on the L without therapist  physically turning her to the L so she could see it.  Question whether she may have a visual field cut, but unable to formally assess due to language barrier.  Pt positioned in w/c with tray table set in front and NT in to assist.    Therapy Documentation Precautions:  Precautions Precautions: Fall, Other (comment) Precaution Comments: L hemiplegia; decreased L attention Restrictions Weight Bearing Restrictions: No   See Function Navigator for Current Functional Status.   Therapy/Group: Individual Therapy  [name] 03/14/2017, 12:21 PM

## 2017-03-14 NOTE — Progress Notes (Signed)
Social Work Patient ID: Kristen Short, female   DOB: 03-18-1965, 52 y.o.   MRN: [id number]  Met with pt and interpreter to discuss team conference goals and reaching her levels sooner. Moving up discharge date to 2/9, with family needing to come in for education prior to this date. She called her husband while this worker was there and informed him. Need to get her sister in before going home since she will be the one providing care at home, while the others work.

## 2017-03-14 NOTE — Progress Notes (Signed)
Speech Language Pathology Daily Session Note  Patient Details  Name: Kristen Short MRN: [id number] Date of Birth: 23-Sep-1965  Today's Date: 03/14/2017 SLP Individual Time: 1000-1100 SLP Individual Time Calculation (min): 60 min  Short Term Goals: Week 2: SLP Short Term Goal 1 (Week 2): Patient will demonstrate efficient mastication and complete oral clearance with trials of regular textures over 2 sessions prior to upgrade with supervision verbal cues.  SLP Short Term Goal 2 (Week 2): Patient will identify 2 physical and 2 cognitive impairments with Min A verbal cues.  SLP Short Term Goal 3 (Week 2): Patient will demonstrate functional problem solving for mildly complex and familiar tasks with Min A verbal cues.  SLP Short Term Goal 4 (Week 2): Patient will attend to left field of enviornment during tasks with Min A verbal and visual cues.  SLP Short Term Goal 5 (Week 2): Patient will demonstrate selective attention to functional tasks in a mildly distracting enviornment for 45 minutes with Min A verbal cues for redirection.  SLP Short Term Goal 6 (Week 2): Patient will recall new, daily information with Min A verbal and visual cues.   Skilled Therapeutic Interventions: Skilled treatment session focused on cognitive goals. SLP facilitated session by providing Mod A verbal cues for recall of procedures to a previously learned task (yesterday's session). Pt also required Mod A verbal cues for problem solving with the mildly complex task and Min A verbal cues for attention to left field of environment throughout task. Pt demonstrated selective attention in a slightly distractive environment for ~45 minutes with Min A verbal cues for redirection. Pt left upright in wheelchair with RN for transfer to the bathroom. Continue with current plan of care.     Function:  Cognition Comprehension Comprehension assist level: Understands basic 75 - 89% of the time/ requires cueing 10 - 24% of the time  Expression    Expression assist level: Expresses basic 75 - 89% of the time/requires cueing 10 - 24% of the time. Needs helper to occlude trach/needs to repeat words.  Social Interaction Social Interaction assist level: Interacts appropriately 75 - 89% of the time - Needs redirection for appropriate language or to initiate interaction.  Problem Solving Problem solving assist level: Solves basic 50 - 74% of the time/requires cueing 25 - 49% of the time  Memory Memory assist level: Recognizes or recalls 50 - 74% of the time/requires cueing 25 - 49% of the time    Pain No/denies pain  Therapy/Group: Individual Therapy  Roxan HockeyYahui Heidi Maclin  SLP - Student 03/14/2017, 3:51 PM

## 2017-03-14 NOTE — Progress Notes (Signed)
Occupational Therapy Session Note  Patient Details  Name: Kristen Short MRN: [id number] Date of Birth: [birth year]  Today's Date: 03/14/2017 OT Individual Time: 1300-1415 OT Individual Time Calculation (min): 75 min   Short Term Goals: Week 2:  OT Short Term Goal 1 (Week 2): STG=LTG 2/2 ELOS  Skilled Therapeutic Interventions/Progress Updates:    OT treatment session focused on functional ambulation, L attention, and L NMR. Pt ambulated to therapy gym with min cues for pathfinding and close supervision with quad cane. Towel pushes on raised therapy mat. Then added line on therapy mat and had pt push marbles for L to R across line. Pt needed assistance for shoulder abduction to bring L UE out to L, then able to horizontally adduct and push marbles across line. Worked on L side attention and locating objects on L side when ambulating back to room to go to the bathroom. Pt needed mod-max cues to head turn and turn to the L. Pt accessed bathroom and completed 3/3 toileting steps with supervision (voided bladder). Pt then ambulated to dayroom in similar fashion and completed 10 mins on [employer] arm bike. OT secured L UE to handle with Ace wrap, then pt able to activate shoulder, scapula, and elbow push through handle. Pt ambulated back to room and left semi-reclined in bed with needs met.   Therapy Documentation Precautions:  Precautions Precautions: Fall, Other (comment) Precaution Comments: L hemiplegia; decreased L attention Restrictions Weight Bearing Restrictions: No ADL: ADL ADL Comments: Please see functional navigator  See Function Navigator for Current Functional Status.   Therapy/Group: Individual Therapy  Valma Cava 03/14/2017, 1:56 PM

## 2017-03-14 NOTE — Patient Care Conference (Signed)
Inpatient RehabilitationTeam Conference and Plan of Care Update Date: 03/14/2017   Time: 11:10 AM    Patient Name: Kristen Short      Medical Record Number: [id number]  Date of Birth: 07/24/1965 Sex: Female         Room/Bed: 4W17C/4W17C-01 Payor Info: Payor: Advertising copywriter / Plan: [employer] OTHER / Product Type: *No Product type* /    Admitting Diagnosis: R CVA  Admit Date/Time:  03/05/2017  6:05 PM Admission Comments: No comment available   Primary Diagnosis:  <principal problem not specified> Principal Problem: <principal problem not specified>  Patient Active Problem List   Diagnosis Date Noted  . Acute ischemic right MCA stroke (HCC) 03/05/2017  . Cognitive deficit, post-stroke   . Itching   . Diabetes mellitus, new onset (HCC)   . AKI (acute kidney injury) (HCC)   . Slow transit constipation   . Left hemiplegia (HCC)   . Type 2 diabetes mellitus without complication, without long-term current use of insulin (HCC)   . Hyperlipidemia   . CVA (cerebral vascular accident) (HCC) 02/28/2017    Expected Discharge Date: Expected Discharge Date: 03/17/17  Team Members Present: Physician leading conference: Dr. Claudette Laws Social Worker Present: Dossie Der, LCSW Nurse Present: Fabian Sharp, RN PT Present: Teodoro Kil, PT OT Present: Kearney Hard, OT SLP Present: Feliberto Gottron, SLP PPS Coordinator present : Tora Duck, RN, CRRN     Current Status/Progress Goal Weekly Team Focus  Medical   Minimal improvement with LUE weakness  reduce fall risk  D/C planning   Bowel/Bladder   Continent of B&B.  Maintain continence.  Assess for toileting needs q shift and PRN.   Swallow/Nutrition/ Hydration             ADL's   Min A/supervision overall  supervision goals  L NMR, functional transfers, NMES, modified bathing/dressing   Mobility   supervision overall with quad cane, reluctant/refuses NMR for LUE  supervision  family education, d/c planning     Communication             Safety/Cognition/ Behavioral Observations  Mod A  Supervision  attention, problem solving, recall and awareness, attention to left    Pain   Pain at 0 to 4.  <3.  Assess for pain q shift and PRN.   Skin   Skin intact. Continues to have some itching. Atarax and Sarna effective.  Maintain skin integrity and ease itching PRN.  Assess skin q shift and PRN.      *See Care Plan and progress notes for long and short-term goals.     Barriers to Discharge  Current Status/Progress Possible Resolutions Date Resolved   Physician    Medical stability;Other (comments)  Left shoulder pain, poor awareness of Left side  pt resisting Neuromuscular re ed  Adjust pain meds for Left shoulder cont E stim      Nursing                  PT                    OT                  SLP                SW                Discharge Planning/Teaching Needs:  Sister, Husband and other family members have been here to observe pt in  therapies and see her progress. Aware of her need for 24 hr supervision upon dishcarge from rehab.      Team Discussion:  Progressing well in therapies, will reach goals sooner than expected. Moving up discharge date to sat 2/9. UE still painful helps with e-stim and stretching. Ordering sling to protect arm when discharged. Will need family education prior to discharge home. Will need 24 hr supervision via family members  Revisions to Treatment Plan:  DC 2/9    Continued Need for Acute Rehabilitation Level of Care: The patient requires daily medical management by a physician with specialized training in physical medicine and rehabilitation for the following conditions: Daily direction of a multidisciplinary physical rehabilitation program to ensure safe treatment while eliciting the highest outcome that is of practical value to the patient.: Yes Daily medical management of patient stability for increased activity during participation in an intensive  rehabilitation regime.: Yes Daily analysis of laboratory values and/or radiology reports with any subsequent need for medication adjustment of medical intervention for : Neurological problems;Other  [name], [name] G 03/16/2017, 8:46 AM

## 2017-03-14 NOTE — Progress Notes (Signed)
Physical Therapy Note  Patient Details  Name: Patsy BaltimoreHwoc Andalon MRN: [id number] Date of Birth: 01-04-1966 Today's Date: 03/14/2017  0915-1000, 45 min individual tx Pain: none   Translator not present; pt impulsive and unsafe due to L inattention and impulsivity.  Pt attempting to get OOB with Lurena Joinerebecca, RN.  mulitmodal cues for pt to attend to LLE in order to sit EOB.  Pt indicated she needed to use toilet.  Gait training in room with NBQC, min assist.  Toilet transfer with min assist, grab bar for continent voiding.  Pt indicated she wanted to eat breakfast.  Pt set up to eat sitting in w/c.  Despite multimodal cues, pt ignored all items on L on her tray.  Mod cues to take small bites of food.  Pt requested PT heat up her rice from home; this was not refrigerated.  PT discussed with Victorino DikeJennifer, RN; she agreed this may be unsafe.  Pt did not understand; she was re-directed when asking numerous times about the rice.   Gait training on level tile with NBQC.  PT attempted to  use fabric gait belt  LUE due to flaccidity; pt stated it irritated her dressing for loop recorder in L chest.  Pt supported LUE during gait.  Pt left resting in w/c to pass off to Verlee Monteora, Consulting civil engineertudent SLP/Courtney, SLP.  See function navigator for current status.  Trena Dunavan 03/14/2017, 8:11 AM

## 2017-03-15 ENCOUNTER — Inpatient Hospital Stay (HOSPITAL_COMMUNITY): Payer: 59 | Admitting: Speech Pathology

## 2017-03-15 ENCOUNTER — Inpatient Hospital Stay (HOSPITAL_COMMUNITY): Payer: 59 | Admitting: Physical Therapy

## 2017-03-15 ENCOUNTER — Inpatient Hospital Stay (HOSPITAL_COMMUNITY): Payer: 59 | Admitting: Occupational Therapy

## 2017-03-15 LAB — GLUCOSE, CAPILLARY
GLUCOSE-CAPILLARY: 152 mg/dL — AB (ref 65–99)
GLUCOSE-CAPILLARY: 301 mg/dL — AB (ref 65–99)
GLUCOSE-CAPILLARY: 315 mg/dL — AB (ref 65–99)
Glucose-Capillary: 97 mg/dL (ref 65–99)

## 2017-03-15 MED ORDER — GLIMEPIRIDE 2 MG PO TABS
2.0000 mg | ORAL_TABLET | Freq: Every day | ORAL | Status: DC
  Administered 2017-03-15 – 2017-03-17 (×3): 2 mg via ORAL
  Filled 2017-03-15 (×3): qty 1

## 2017-03-15 NOTE — Progress Notes (Signed)
Subjective/Complaints:  No issues overnite  Review of systems difficult to obtain secondary to language.  She does answer some yes/no questions but not accurate states yes to eating breakfast but tray untouched  Objective: Vital Signs: Blood pressure 98/61, pulse 69, temperature 97.8 F (36.6 C), temperature source Oral, resp. rate 17, weight 75.5 kg (166 lb 7.2 oz), SpO2 100 %. No results found. Results for orders placed or performed during the hospital encounter of 03/05/17 (from the past 72 hour(s))  Basic metabolic panel     Status: Abnormal   Collection Time: 03/12/17  8:10 AM  Result Value Ref Range   Sodium 138 135 - 145 mmol/L   Potassium 4.0 3.5 - 5.1 mmol/L   Chloride 106 101 - 111 mmol/L   CO2 21 (L) 22 - 32 mmol/L   Glucose, Bld 205 (H) 65 - 99 mg/dL   BUN 14 6 - 20 mg/dL   Creatinine, Ser 0.97 0.44 - 1.00 mg/dL   Calcium 9.7 8.9 - 10.3 mg/dL   GFR calc non Af Amer >60 >60 mL/min   GFR calc Af Amer >60 >60 mL/min    Comment: (NOTE) The eGFR has been calculated using the CKD EPI equation. This calculation has not been validated in all clinical situations. eGFR's persistently <60 mL/min signify possible Chronic Kidney Disease.    Anion gap 11 5 - 15    Comment: Performed at [facility] [address] [place], [place] [address]  CBC     Status: Abnormal   Collection Time: 03/12/17  8:10 AM  Result Value Ref Range   WBC 7.1 4.0 - 10.5 K/uL   RBC 5.77 (H) 3.87 - 5.11 MIL/uL   Hemoglobin 14.3 12.0 - 15.0 g/dL   HCT 44.2 36.0 - 46.0 %   MCV 76.6 (L) 78.0 - 100.0 fL   MCH 24.8 (L) 26.0 - 34.0 pg   MCHC 32.4 30.0 - 36.0 g/dL   RDW 14.2 11.5 - 15.5 %   Platelets 191 150 - 400 K/uL    Comment: Performed at [facility], [place] [address], [place], [place] [address]  Glucose, capillary     Status: Abnormal   Collection Time: 03/12/17 11:46 AM  Result Value Ref Range   Glucose-Capillary 117 (H) 65 - 99 mg/dL  Glucose, capillary     Status: Abnormal   Collection Time: 03/12/17  5:00 PM  Result Value Ref Range   Glucose-Capillary 164 (H) 65 - 99 mg/dL  Glucose, capillary     Status: Abnormal   Collection Time: 03/12/17  9:24 PM  Result Value Ref Range   Glucose-Capillary 253 (H) 65 - 99 mg/dL  Glucose, capillary     Status: Abnormal   Collection Time: 03/13/17  6:46 AM  Result Value Ref Range   Glucose-Capillary 136 (H) 65 - 99 mg/dL  Glucose, capillary     Status: Abnormal   Collection Time: 03/13/17 12:16 PM  Result Value Ref Range   Glucose-Capillary 115 (H) 65 - 99 mg/dL  Glucose, capillary     Status: Abnormal   Collection Time: 03/13/17  4:38 PM  Result Value Ref Range   Glucose-Capillary 254 (H) 65 - 99 mg/dL   Comment 1 Notify RN   Glucose, capillary     Status: Abnormal   Collection Time: 03/13/17  9:31 PM  Result Value Ref Range   Glucose-Capillary 243 (H) 65 - 99 mg/dL  Glucose, capillary     Status: Abnormal   Collection  Time: 03/14/17  6:27 AM  Result Value Ref Range   Glucose-Capillary 140 (H) 65 - 99 mg/dL  Glucose, capillary     Status: Abnormal   Collection Time: 03/14/17 12:04 PM  Result Value Ref Range   Glucose-Capillary 160 (H) 65 - 99 mg/dL  Glucose, capillary     Status: Abnormal   Collection Time: 03/14/17  5:12 PM  Result Value Ref Range   Glucose-Capillary 113 (H) 65 - 99 mg/dL  Glucose, capillary     Status: Abnormal   Collection Time: 03/14/17  8:46 PM  Result Value Ref Range   Glucose-Capillary 293 (H) 65 - 99 mg/dL  Glucose, capillary     Status: Abnormal   Collection Time: 03/15/17  6:44 AM  Result Value Ref Range   Glucose-Capillary 152 (H) 65 - 99 mg/dL     HEENT: normal Cardio: RRR and No murmurs or extra sounds Resp: CTA B/L and Unlabored GI: BS positive and Nontender and nondistended Extremity:  Pulses positive and No Edema Skin:   Other Loop recorder site clean dry and intact Neuro: Lethargic, Flat, Abnormal Sensory Difficult to assess sensation secondary to language.,  Abnormal Motor 0/5 in the triceps grip 3- in the left hip flexion as well as knee extension hip extension synergy, 2- at the ankle DF/PF, TE/TF Musc/Skel:  Swelling Left calf with mild tenderness to palpation.  No evidence of knee effusion or left thigh swelling General no acute distress   Assessment/Plan: 1. 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 Physiatrist is providing close team supervision and 24 hour management of active medical problems listed below. Physiatrist and rehab team continue to assess barriers to discharge/monitor patient progress toward functional and medical goals. FIM: Function - Bathing Position: Shower Body parts bathed by patient: Left arm, Chest, Abdomen, Front perineal area, Right upper leg, Left upper leg, Right lower leg, Left lower leg, Buttocks Body parts bathed by helper: Right arm Assist Level: Touching or steadying assistance(Pt > 75%)  Function- Upper Body Dressing/Undressing What is the patient wearing?: Pull over shirt/dress Pull over shirt/dress - Perfomed by patient: Thread/unthread right sleeve, Thread/unthread left sleeve, Put head through opening Pull over shirt/dress - Perfomed by helper: Pull shirt over trunk Assist Level: Touching or steadying assistance(Pt > 75%) Function - Lower Body Dressing/Undressing What is the patient wearing?: Pants, Non-skid slipper socks Position: Wheelchair/chair at sink Underwear - Performed by patient: Thread/unthread left underwear leg, Thread/unthread right underwear leg Underwear - Performed by helper: Pull underwear up/down Pants- Performed by patient: Thread/unthread left pants leg, Thread/unthread right pants leg, Pull pants up/down Pants- Performed by helper: Pull pants up/down Non-skid slipper socks- Performed by patient: Don/doff right sock, Don/doff  left sock Non-skid slipper socks- Performed by helper: Don/doff left sock Assist for footwear: Supervision/touching assist Assist for lower body dressing: Touching or steadying assistance (Pt > 75%)  Function - Toileting Toileting steps completed by patient: Adjust clothing prior to toileting, Performs perineal hygiene, Adjust clothing after toileting Toileting steps completed by helper: Adjust clothing prior to toileting, Adjust clothing after toileting Toileting Assistive Devices: Grab bar or rail Assist level: Set up/obtain supplies  Function - Air cabin crew transfer activity did not occur: Safety/medical concerns Toilet transfer assistive device: Grab bar Assist level to toilet: Supervision or verbal cues Assist level from toilet: Supervision or verbal cues  Function - Chair/bed transfer Chair/bed transfer activity  did not occur: Safety/medical concerns Chair/bed transfer method: Stand pivot(Simultaneous filing. User may not have seen previous data.) Chair/bed transfer assist level: Touching or steadying assistance (Pt > 75%)(Simultaneous filing. User may not have seen previous data.) Chair/bed transfer assistive device: Armrests, Cane(Simultaneous filing. User may not have seen previous data.) Chair/bed transfer details: Verbal cues for precautions/safety, Verbal cues for safe use of DME/AE, Visual cues/gestures for precautions/safety  Function - Locomotion: Wheelchair Will patient use wheelchair at discharge?: (TBD for community use; primary ambulator) Type: Manual Assist Level: Dependent (Pt equals 0%) Assist Level: Dependent (Pt equals 0%) Assist Level: Dependent (Pt equals 0%) Turns around,maneuvers to table,bed, and toilet,negotiates 3% grade,maneuvers on rugs and over doorsills: No Function - Locomotion: Ambulation Assistive device: Cane-quad(Simultaneous filing. User may not have seen previous data.) Max distance: 150'(Simultaneous filing. User may not have seen  previous data.) Assist level: Touching or steadying assistance (Pt > 75%)(Simultaneous filing. User may not have seen previous data.) Assist level: Touching or steadying assistance (Pt > 75%)(Simultaneous filing. User may not have seen previous data.) Walk 50 feet with 2 turns activity did not occur: Safety/medical concerns Assist level: Touching or steadying assistance (Pt > 75%)(Simultaneous filing. User may not have seen previous data.) Walk 150 feet activity did not occur: Safety/medical concerns Assist level: Touching or steadying assistance (Pt > 75%)(Simultaneous filing. User may not have seen previous data.) Walk 10 feet on uneven surfaces activity did not occur: Safety/medical concerns  Function - Comprehension Comprehension: Auditory Comprehension assist level: Understands basic 75 - 89% of the time/ requires cueing 10 - 24% of the time  Function - Expression Expression: Verbal Expression assist level: Expresses basic 75 - 89% of the time/requires cueing 10 - 24% of the time. Needs helper to occlude trach/needs to repeat words.  Function - Social Interaction Social Interaction assist level: Interacts appropriately 75 - 89% of the time - Needs redirection for appropriate language or to initiate interaction.  Function - Problem Solving Problem solving assist level: Solves basic 50 - 74% of the time/requires cueing 25 - 49% of the time  Function - Memory Memory assist level: Recognizes or recalls 50 - 74% of the time/requires cueing 25 - 49% of the time Patient normally able to recall (first 3 days only): Current season, Location of own room, Staff names and faces, That he or she is in a hospital  Medical Problem List and Plan: 1.  Dense left hemiparesis, left facial weakness, left inattention with right gaze preference, diplopia with question of Left HH as well as impulsivity with cognitive deficits secondary to right MCA infarct.  CIR PT OT speech. Cont rehab CIR level 2.  DVT  Prophylaxis/Anticoagulation: Pharmaceutical: Lovenox. Check dopplers as positive for small PFO.  3. Pain Management: tylenol prn for HA. Left trap myofascial pain inproving with local care 4. Mood: LCSW to follow for evaluation and support as mentation improves.  5. Neuropsych: This patient is not fully  capable of making decisions on her  own behalf. 6. Skin/Wound Care: routine pressure relief measures.  7. Fluids/Electrolytes/Nutrition: Monitor I/O.  8. Chronic pruritis: This was a problem at home, Cont. topical cream which seems to be helping.  Cont Atarax prn at night 9. New diagnosis T2DM:  Hgb A1C- 7.5--educate patient on CM diet. Will monitor BS ac/hs. May need oral agent.     Recent Labs    03/14/17 1712 03/14/17 2046 03/15/17 0644  GLUCAP 113* 293* 152*  uncontrolled, started glimipride 2/4, will increase to 3m on 2/7  10. Acute kidney injury: due to poor po intake reported by family --in part due to lethargy. Offer fluids during the day. Encourage intake.  BUN mildly elevated creatinine mildly elevated  intake 720 mL recorded 2/6, improving 11. Constipation:  Start bowel program-- add Miralax daily.  12. Dyslipidemia: On Lipitor.    LOS (Days) [place] Dona Klemann 03/15/2017, 7:50 AM

## 2017-03-15 NOTE — Progress Notes (Signed)
Orthopedic Tech Progress Note Patient Details:  Kristen Short 1966-01-27 [id number]  Ortho Devices Type of Ortho Device: Arm sling Ortho Device/Splint Location: lue Ortho Device/Splint Interventions: Application   Post Interventions Patient Tolerated: Well Instructions Provided: Care of device   [name], [name] 03/15/2017, 11:44 AM

## 2017-03-15 NOTE — Progress Notes (Signed)
Occupational Therapy Session Note  Patient Details  Name: Kristen Short MRN: [id number] Date of Birth: 07/27/1965  Today's Date: 03/15/2017 OT Individual Time: 1300-1415 OT Individual Time Calculation (min): 75 min   Short Term Goals: Week 2:  OT Short Term Goal 1 (Week 2): STG=LTG 2/2 ELOS  Skilled Therapeutic Interventions/Progress Updates:    OT treatment session focused on modified bathing/dressing, pt/family education, and L UE NMR. Pt's husband present throughout session focused on safe assistance with BADL tasks, functional transfers. Pt's spouse assisted pt with ambulating to bathroom, transferring on/off toilet. Pt voided bladder and completed 3/3 toileting steps with supervision. Pt's spouse then assisted with bathing tasks with OT educated pt's spouse on allowing pt to do her own bathing as she can complete all bathing tasks with supervision. Pt needed max multimodal cues to recall hemi-dressing techniques for UB dressing. Educated pt's spouse on how to cue pt and pt able to complete with supervision. Pt then ambulated to therapy apartment and practiced tub bench transfer in simulated home bathroom environment. Pt then completed 5 mins on Sci Fit arm bike with L UE maintaining grasp with Ace wrap. Pt left seated in wc with safety belt on and needs met.   Therapy Documentation Precautions:  Precautions Precautions: Fall, Other (comment) Precaution Comments: L hemiplegia; decreased L attention Restrictions Weight Bearing Restrictions: No Pain:  none/denies pain ADL: ADL ADL Comments: Please see functional navigator  See Function Navigator for Current Functional Status.  Therapy/Group: Individual Therapy  Elisabeth S Doe 03/15/2017, 2:19 PM  

## 2017-03-15 NOTE — Progress Notes (Signed)
Speech Language Pathology Daily Session Note  Patient Details  Name: Kristen Short MRN: [id number] Date of Birth: [birth year]  Today's Date: 03/15/2017 SLP Individual Time: 1000-1100 SLP Individual Time Calculation (min): 60 min  Short Term Goals: Week 2: SLP Short Term Goal 1 (Week 2): Patient will demonstrate efficient mastication and complete oral clearance with trials of regular textures over 2 sessions prior to upgrade with supervision verbal cues.  SLP Short Term Goal 2 (Week 2): Patient will identify 2 physical and 2 cognitive impairments with Min A verbal cues.  SLP Short Term Goal 3 (Week 2): Patient will demonstrate functional problem solving for mildly complex and familiar tasks with Min A verbal cues.  SLP Short Term Goal 4 (Week 2): Patient will attend to left field of enviornment during tasks with Min A verbal and visual cues.  SLP Short Term Goal 5 (Week 2): Patient will demonstrate selective attention to functional tasks in a mildly distracting enviornment for 45 minutes with Min A verbal cues for redirection.  SLP Short Term Goal 6 (Week 2): Patient will recall new, daily information with Min A verbal and visual cues.   Skilled Therapeutic Interventions: Skilled treatment session focused on cognitive goals. SLP facilitated session by providing Min A verbal cues for transfer from recliner to wheelchair. Pt required Min A verbal cues for intellectual awareness in regards to identification of 2 physical and 2 cognitive impairments. SLP further facilitated session by administering trials of snacks of regular textures. Pt consumed snack without overt s/s of aspiration and demonstrated self-monitoring for pocketing with Mod I. Recommend continuing trials with SLP. SLP further facilitated session by administering MOCA version 7.3. Pt scored 23 out of 30 points with a score of 26 and above considered normal. Although pt continued to demonstrate deficits in attention, pt's score increased 8  points compared to 7 days ago. Pt's ability to recall and attend to left field of environment have improved. Pt required Supervision verbal cues for selective attention in a moderately distracting environment for ~50 minutes throughout task. Pt left upright in recliner with belt in place and all needs within reach. Continue with current plan of care.    Function:  Eating Eating   Modified Consistency Diet: Yes Eating Assist Level: No help, No cues;Set up assist for   Eating Set Up Assist For: Opening containers       Cognition Comprehension Comprehension assist level: Understands basic 90% of the time/cues < 10% of the time  Expression   Expression assist level: Expresses basic 90% of the time/requires cueing < 10% of the time.  Social Interaction Social Interaction assist level: Interacts appropriately 75 - 89% of the time - Needs redirection for appropriate language or to initiate interaction.  Problem Solving Problem solving assist level: Solves basic 50 - 74% of the time/requires cueing 25 - 49% of the time  Memory Memory assist level: Recognizes or recalls 75 - 89% of the time/requires cueing 10 - 24% of the time    Pain No/denies pain  Therapy/Group: Individual Therapy  Roxan HockeyYahui Felipa Laroche  SLP - Student 03/15/2017, 3:53 PM

## 2017-03-15 NOTE — Progress Notes (Signed)
Physical Therapy Session Note  Patient Details  Name: Kristen Short MRN: [id number] Date of Birth: [birth year]  Today's Date: 03/15/2017 PT Individual Time: 0900-1000 PT Individual Time Calculation (min): 60 min   Short Term Goals: Week 2:  PT Short Term Goal 1 (Week 2): =LTGs due to ELOS  Skilled Therapeutic Interventions/Progress Updates:    no c/o pain.  Session focus on attention and visual scanning during functional mobility and NMR tasks.    Pt completing breakfast on entry.  Toileting with distant supervision.  Gait throughout unit with [facility] and distant supervision with max cues to attend to L environment.  Nustep x8 minutes with 4 extremities, therapist supporting LUE on handle, focus on attention to timing and LE positioning.  Pt able to attend to LE neutral positioning for about 20 seconds in mildly distracting environment, mod cues to maintain positioning throughout activity.  Path finding to therapy gym with mod cues.  LUE NMR on UE ranger focus on isolated movement with extensive education on reducing trunk movement as compensatory strategy.  Path finding back to pt's room, pt requires max cues for every left turn and total assist with manual facilitation to turn her body to her room on the L.  Pt positioned in recliner with chair alarm activated, call bell in reach and needs met.   Therapy Documentation Precautions:  Precautions Precautions: Fall, Other (comment) Precaution Comments: L hemiplegia; decreased L attention Restrictions Weight Bearing Restrictions: No   See Function Navigator for Current Functional Status.   Therapy/Group: Individual Therapy  [name] 03/15/2017, 10:37 AM

## 2017-03-16 ENCOUNTER — Ambulatory Visit: Payer: 59

## 2017-03-16 ENCOUNTER — Inpatient Hospital Stay (HOSPITAL_COMMUNITY): Payer: 59 | Admitting: Occupational Therapy

## 2017-03-16 ENCOUNTER — Inpatient Hospital Stay (HOSPITAL_COMMUNITY): Payer: 59 | Admitting: Speech Pathology

## 2017-03-16 ENCOUNTER — Ambulatory Visit (HOSPITAL_COMMUNITY): Payer: 59 | Admitting: Physical Therapy

## 2017-03-16 LAB — GLUCOSE, CAPILLARY
GLUCOSE-CAPILLARY: 82 mg/dL (ref 65–99)
GLUCOSE-CAPILLARY: 286 mg/dL — AB (ref 65–99)
Glucose-Capillary: 170 mg/dL — ABNORMAL HIGH (ref 65–99)
Glucose-Capillary: 327 mg/dL — ABNORMAL HIGH (ref 65–99)

## 2017-03-16 MED ORDER — POLYETHYLENE GLYCOL 3350 17 G PO PACK: 17.0000 g | PACK | Freq: Every day | ORAL | 0 refills | Status: DC

## 2017-03-16 MED ORDER — ATORVASTATIN CALCIUM 80 MG PO TABS: 80.0000 mg | ORAL_TABLET | Freq: Every day | ORAL | 0 refills | Status: DC

## 2017-03-16 MED ORDER — HYDROXYZINE HCL 10 MG PO TABS: 10.0000 mg | ORAL_TABLET | Freq: Three times a day (TID) | ORAL | 0 refills | Status: DC | PRN

## 2017-03-16 MED ORDER — METFORMIN HCL 500 MG PO TABS
500.0000 mg | ORAL_TABLET | Freq: Every day | ORAL | Status: DC
  Administered 2017-03-16 – 2017-03-17 (×2): 500 mg via ORAL
  Filled 2017-03-16 (×2): qty 1

## 2017-03-16 MED ORDER — ASPIRIN 325 MG PO TABS: 325.0000 mg | ORAL_TABLET | Freq: Every day | ORAL | 0 refills | Status: AC

## 2017-03-16 MED ORDER — GLIMEPIRIDE 2 MG PO TABS: 2.0000 mg | ORAL_TABLET | Freq: Every day | ORAL | 0 refills | Status: DC

## 2017-03-16 MED ORDER — METFORMIN HCL 500 MG PO TABS: 500.0000 mg | ORAL_TABLET | Freq: Every day | ORAL | 0 refills | Status: DC

## 2017-03-16 MED ORDER — CAMPHOR-MENTHOL 0.5-0.5 % EX LOTN: TOPICAL_LOTION | Freq: Three times a day (TID) | CUTANEOUS | 0 refills | Status: DC

## 2017-03-16 NOTE — Discharge Summary (Signed)
Physician Discharge Summary  Patient ID: Kristen Short MRN: [id number] DOB/AGE: 11-03-1965 52 y.o.  Admit date: 03/05/2017 Discharge date: 03/16/2017  Discharge Diagnoses:  Principal Problem:   Acute ischemic right MCA stroke [facility]) Active Problems:   Type 2 diabetes mellitus without complication, without long-term current use of insulin (HCC)   Hyperlipidemia   Cognitive deficit, post-stroke   Itching   Diabetes mellitus, new onset (HCC)   Slow transit constipation   Left hemiplegia (HCC)   Left hemiparesis (HCC)   Discharged Condition: stable   Significant Diagnostic Studies:   Labs:  Basic Metabolic Panel: BMP Latest Ref Rng & Units 03/12/2017 03/06/2017 03/04/2017  Glucose 65 - 99 mg/dL 409(W) 119(J) 478(G)  BUN 6 - 20 mg/dL 14 95(A) 21(H)  Creatinine 0.44 - 1.00 mg/dL 0.86 5.78(I) 6.96(E)  Sodium 135 - 145 mmol/L 138 140 139  Potassium 3.5 - 5.1 mmol/L 4.0 4.2 4.2  Chloride 101 - 111 mmol/L 106 107 106  CO2 22 - 32 mmol/L 21(L) 22 22  Calcium 8.9 - 10.3 mg/dL 9.7 9.3 9.5    CBC: CBC Latest Ref Rng & Units 03/12/2017 03/06/2017 02/28/2017  WBC 4.0 - 10.5 K/uL 7.1 7.4 -  Hemoglobin 12.0 - 15.0 g/dL 95.2 15.5(H) 17.7(H)  Hematocrit 36.0 - 46.0 % 44.2 48.3(H) 52.0(H)  Platelets 150 - 400 K/uL 191 169 -    CBG: Recent Labs  Lab 03/15/17 1639 03/15/17 2053 03/16/17 0638 03/16/17 1211 03/16/17 1632  GLUCAP 301* 315* 170* 82 327*     Brief HPI:   Kristen Short a 52 y.o.femalewith no known prior medical historyandimmigrated from [place] a year ago.She was admitted on 02/28/17 with reports of HA since last Saturday followed by left sided weakness and difficulty walking. 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 MRI brain showed acute large right frontal MCA, right posterior watershed territory  infarcts and subacute small right frontal lobe/MCA infarct with regional mass effect. Dr. Pearlean Brownie recommended full workup-- TEE was negative for thrombus or aortic debris but showed small PFO with positive bubble study. Loop recorder placed by cardiology. Patient with resultant dense left hemiparesis, left facial weakness, left inattention with right gaze preference, diplopia with question of Left HH as well as impulsivity with cognitive deficits. CIR recommended for follow up therapy.    Hospital Course: Kristen Short was admitted to rehab 03/05/2017 for inpatient therapies to consist of PT, ST and OT at least three hours five days a week. Past admission physiatrist, therapy team and rehab RN have worked together to provide customized collaborative inpatient rehab. Blood pressures were monitored on bid basis and has been stable. Patient and family has been educated on new diagnosis of diabetes as well as need for CM diet. Blood sugars were monitored on ac/hs basis and metformin as well as low dose Amaryl was added to help with BS control.  Miralax was added to help with constipation.  Atarax and sarna lotion were used to help manage chronic pruritis.  Follow up labs showed AKI due to poor po intake and megace was added for supplement. Diet was advanced to regular textures and family is providing food from home. Follow up labs showed AKI has resolved. Intake has improved and megace was discontinued at discharge. She had 2 finger subluxation of left shoulder with left trap myofascial pain due to dense left hemiparesis with left inattention  with poor awareness. Sling was ordered for support when up and patient and family have been educated on importance of positioning as well as use of sling to help with symptom management as well as prevent further subluxation.  She has made good progress and is currently at supervision level due to left visual field deficits, impulsivity and poor safety.  She will continue to receive  follow up HHPT, HHOT and HHST by [facility] after discharge.   Rehab course: During patient's stay in rehab team conference was held to monitor patient's progress, set goals and discuss barriers to discharge. At admission, patient required mod to max assist with mobility and max assist with basic self care tasks.  She exhibited mild oral dysphagia and showed deficits in attention, awareness as well as impulsivity affecting problem solving and safety.  She  has had improvement in activity tolerance, balance, postural control as well as ability to compensate for deficits. She has had improvement in functional use LUE  and LLE as well as improvement in awareness. She is able to complete ADL tasks with supervision. She requires supervision with transfers and is able to ambulate 300' with supervision and SBQC. She requires supervision to climb 12 stairs.  Diet has been advanced to dysphagia 3 and she currently requires min assist with memory, complex tasks and for awareness of deficits.  Family education was complete with husband and sister regarding all aspects of care, functional status as well as safety in regards to left inattention and left field cut.    Disposition: 01-Home or Self Care  Diet: Heart Healthy/Diabetic.   Special Instructions: 1. Needs 24 hours supervision. Wear sling LUE when ambulating 2. Family to administer medications.   Discharge Instructions    Ambulatory referral to Physical Medicine Rehab   Complete by:  As directed    1-2 weeks transitional care appt     Allergies as of 03/17/2017   No Known Allergies     Medication List    TAKE these medications   acetaminophen 500 MG tablet Commonly known as:  TYLENOL Take 500 mg by mouth every 6 (six) hours as needed for mild pain.   aspirin 325 MG tablet Take 1 tablet (325 mg total) by mouth daily.   atorvastatin 80 MG tablet Commonly known as:  LIPITOR Take 1 tablet (80 mg total) by mouth daily at 6 PM.    camphor-menthol lotion Commonly known as:  SARNA Apply topically 3 (three) times daily.   glimepiride 2 MG tablet Commonly known as:  AMARYL Take 1 tablet (2 mg total) by mouth daily with breakfast.   hydrOXYzine 10 MG tablet Commonly known as:  ATARAX/VISTARIL Take 1 tablet (10 mg total) by mouth 3 (three) times daily as needed for itching.   metFORMIN 500 MG tablet Commonly known as:  GLUCOPHAGE Take 1 tablet (500 mg total) by mouth daily with breakfast.   polyethylene glycol packet Commonly known as:  MIRALAX / GLYCOLAX Take 17 g by mouth daily.      Follow-up Information    [facility] Follow up on 03/22/2017.   Why:  Appointment @ 10:15 AM Contact information: [address] [place] [id number] [phone]       Erick ColaceKirsteins, Andrew E, MD Follow up.   Specialty:  Physical Medicine and Rehabilitation Why:  office will call you with follow up appointment Contact information: [address] [place] [address] [phone]  Micki Riley, MD. Call in 1 day(s).   Specialties:  Neurology, Radiology Why:  for follow up appointment in 4 weeks Contact information: [address] [place] [address] [phone]           Signed: Jacquelynn Cree 03/19/2017, 6:07 PM

## 2017-03-16 NOTE — Progress Notes (Signed)
Subjective/Complaints:  Husband at bedisde, he states she slept well  Review of systems difficult to obtain secondary to language.  She does answer some yes/no questions but not accurate states yes to eating breakfast but tray untouched  Objective: Vital Signs: Blood pressure (!) 114/54, pulse 80, temperature 98.5 F (36.9 C), temperature source Oral, resp. rate 16, weight 75.5 kg (166 lb 7.2 oz), SpO2 100 %. No results found. Results for orders placed or performed during the hospital encounter of 03/05/17 (from the past 72 hour(s))  Glucose, capillary     Status: Abnormal   Collection Time: 03/13/17 12:16 PM  Result Value Ref Range   Glucose-Capillary 115 (H) 65 - 99 mg/dL  Glucose, capillary     Status: Abnormal   Collection Time: 03/13/17  4:38 PM  Result Value Ref Range   Glucose-Capillary 254 (H) 65 - 99 mg/dL   Comment 1 Notify RN   Glucose, capillary     Status: Abnormal   Collection Time: 03/13/17  9:31 PM  Result Value Ref Range   Glucose-Capillary 243 (H) 65 - 99 mg/dL  Glucose, capillary     Status: Abnormal   Collection Time: 03/14/17  6:27 AM  Result Value Ref Range   Glucose-Capillary 140 (H) 65 - 99 mg/dL  Glucose, capillary     Status: Abnormal   Collection Time: 03/14/17 12:04 PM  Result Value Ref Range   Glucose-Capillary 160 (H) 65 - 99 mg/dL  Glucose, capillary     Status: Abnormal   Collection Time: 03/14/17  5:12 PM  Result Value Ref Range   Glucose-Capillary 113 (H) 65 - 99 mg/dL  Glucose, capillary     Status: Abnormal   Collection Time: 03/14/17  8:46 PM  Result Value Ref Range   Glucose-Capillary 293 (H) 65 - 99 mg/dL  Glucose, capillary     Status: Abnormal   Collection Time: 03/15/17  6:44 AM  Result Value Ref Range   Glucose-Capillary 152 (H) 65 - 99 mg/dL  Glucose, capillary     Status: None   Collection Time: 03/15/17 11:38 AM  Result Value Ref Range   Glucose-Capillary 97 65 - 99 mg/dL  Glucose, capillary     Status: Abnormal    Collection Time: 03/15/17  4:39 PM  Result Value Ref Range   Glucose-Capillary 301 (H) 65 - 99 mg/dL  Glucose, capillary     Status: Abnormal   Collection Time: 03/15/17  8:53 PM  Result Value Ref Range   Glucose-Capillary 315 (H) 65 - 99 mg/dL   Comment 1 Notify RN   Glucose, capillary     Status: Abnormal   Collection Time: 03/16/17  6:38 AM  Result Value Ref Range   Glucose-Capillary 170 (H) 65 - 99 mg/dL   Comment 1 Notify RN      HEENT: normal Cardio: RRR and No murmurs or extra sounds Resp: CTA B/L and Unlabored GI: BS positive and Nontender and nondistended Extremity:  Pulses positive and No Edema Skin:   Other Loop recorder site clean dry and intact Neuro: Lethargic, Flat, Abnormal Sensory Difficult to assess sensation secondary to language., Abnormal Motor 0/5 in the triceps grip 3- in the left hip flexion as well as knee extension hip extension synergy, 2- at the ankle DF/PF, TE/TF Musc/Skel:  Swelling Left calf with mild tenderness to palpation.  No evidence of knee effusion or left thigh swelling General no acute distress   Assessment/Plan: 1. Functional deficits secondary to right MCA embolic infarct with left  hemiparesis left neglect which require 3+ hours per day of interdisciplinary therapy in a comprehensive inpatient rehab setting. Physiatrist is providing close team supervision and 24 hour management of active medical problems listed below. Physiatrist and rehab team continue to assess barriers to discharge/monitor patient progress toward functional and medical goals. FIM: Function - Bathing Position: Shower Body parts bathed by patient: Left arm, Chest, Abdomen, Front perineal area, Right upper leg, Left upper leg, Right lower leg, Left lower leg, Buttocks Body parts bathed by helper: Right arm Assist Level: Supervision or verbal cues  Function- Upper Body Dressing/Undressing What is the patient wearing?: Pull over shirt/dress Pull over shirt/dress -  Perfomed by patient: Thread/unthread right sleeve, Put head through opening, Thread/unthread left sleeve, Pull shirt over trunk Pull over shirt/dress - Perfomed by helper: Pull shirt over trunk Assist Level: Supervision or verbal cues Function - Lower Body Dressing/Undressing What is the patient wearing?: Pants, Shoes Position: Sitting EOB Underwear - Performed by patient: Thread/unthread left underwear leg, Thread/unthread right underwear leg Underwear - Performed by helper: Pull underwear up/down Pants- Performed by patient: Thread/unthread left pants leg, Thread/unthread right pants leg, Pull pants up/down Pants- Performed by helper: Pull pants up/down Non-skid slipper socks- Performed by patient: Don/doff right sock, Don/doff left sock Non-skid slipper socks- Performed by helper: Don/doff left sock Shoes - Performed by patient: Don/doff left shoe, Don/doff right shoe Assist for footwear: Supervision/touching assist Assist for lower body dressing: Supervision or verbal cues  Function - Toileting Toileting steps completed by patient: Adjust clothing prior to toileting, Performs perineal hygiene, Adjust clothing after toileting Toileting steps completed by helper: Adjust clothing prior to toileting, Adjust clothing after toileting Toileting Assistive Devices: Grab bar or rail Assist level: Touching or steadying assistance (Pt.75%)  Function - Archivist transfer activity did not occur: Safety/medical concerns Toilet transfer assistive device: Grab bar Assist level to toilet: Supervision or verbal cues Assist level from toilet: Supervision or verbal cues  Function - Chair/bed transfer Chair/bed transfer activity did not occur: Safety/medical concerns Chair/bed transfer method: Stand pivot(Simultaneous filing. User may not have seen previous data.) Chair/bed transfer assist level: Supervision or verbal cues Chair/bed transfer assistive device: Cane, Armrests Chair/bed  transfer details: Verbal cues for precautions/safety, Verbal cues for safe use of DME/AE, Visual cues/gestures for precautions/safety  Function - Locomotion: Wheelchair Will patient use wheelchair at discharge?: (TBD for community use; primary ambulator) Type: Manual Assist Level: Dependent (Pt equals 0%) Assist Level: Dependent (Pt equals 0%) Assist Level: Dependent (Pt equals 0%) Turns around,maneuvers to table,bed, and toilet,negotiates 3% grade,maneuvers on rugs and over doorsills: No Function - Locomotion: Ambulation Assistive device: Cane-quad(Simultaneous filing. User may not have seen previous data.) Max distance: 150'(Simultaneous filing. User may not have seen previous data.) Assist level: Touching or steadying assistance (Pt > 75%)(Simultaneous filing. User may not have seen previous data.) Assist level: Touching or steadying assistance (Pt > 75%)(Simultaneous filing. User may not have seen previous data.) Walk 50 feet with 2 turns activity did not occur: Safety/medical concerns Assist level: Touching or steadying assistance (Pt > 75%)(Simultaneous filing. User may not have seen previous data.) Walk 150 feet activity did not occur: Safety/medical concerns Assist level: Touching or steadying assistance (Pt > 75%)(Simultaneous filing. User may not have seen previous data.) Walk 10 feet on uneven surfaces activity did not occur: Safety/medical concerns  Function - Comprehension Comprehension: Auditory Comprehension assist level: Understands basic 90% of the time/cues < 10% of the time  Function - Expression Expression: Verbal Expression assist  level: Expresses basic 90% of the time/requires cueing < 10% of the time.  Function - Social Interaction Social Interaction assist level: Interacts appropriately 75 - 89% of the time - Needs redirection for appropriate language or to initiate interaction.  Function - Problem Solving Problem solving assist level: Solves basic 50 - 74% of  the time/requires cueing 25 - 49% of the time  Function - Memory Memory assist level: Recognizes or recalls 75 - 89% of the time/requires cueing 10 - 24% of the time Patient normally able to recall (first 3 days only): Current season, Location of own room, Staff names and faces, That he or she is in a hospital  Medical Problem List and Plan: 1.  Dense left hemiparesis, left facial weakness, left inattention with right gaze preference, diplopia with question of Left HH as well as impulsivity with cognitive deficits secondary to right MCA infarct.  CIR PT OT speech. Cont rehab CIR level 2.  DVT Prophylaxis/Anticoagulation: Pharmaceutical: Lovenox. Check dopplers as positive for small PFO.  3. Pain Management: tylenol prn for HA. Left trap myofascial pain inproving with local care 4. Mood: LCSW to follow for evaluation and support as mentation improves.  5. Neuropsych: This patient is not fully  capable of making decisions on her  own behalf. 6. Skin/Wound Care: routine pressure relief measures.  7. Fluids/Electrolytes/Nutrition: Monitor I/O.  8. Chronic pruritis: This was a problem at home, Cont. topical cream which seems to be helping.  Cont Atarax prn at night 9. New diagnosis T2DM:  Hgb A1C- 7.5--educate patient on CM diet. Will monitor BS ac/hs. May need oral agent.     Recent Labs    03/15/17 1639 03/15/17 2053 03/16/17 0638  GLUCAP 301* 315* 170*  uncontrolled, started glimipride 2/4, will increase to 2mg  on 2/7, blood suger improved this am, add metformin qam 10. AKI resolved ok to start metformin, will monitor BMET.  BUN mildly elevated creatinine mildly elevated  intake 480 mL recorded 2/7, cont to encourage 11. Constipation:  Start bowel program-- add Miralax daily.  12. Dyslipidemia: On Lipitor.    LOS (Days) 11 A FACE TO FACE EVALUATION WAS PERFORMED  Erick Colacendrew E Jettson Crable 03/16/2017, 7:39 AM

## 2017-03-16 NOTE — Progress Notes (Signed)
Speech Language Pathology Discharge Summary  Patient Details  Name: Kristen Short MRN: [id number] Date of Birth: 1965-09-05  Today's Date: 03/16/2017 SLP Individual Time: 1300-1345 SLP Individual Time Calculation (min): 45 min   Skilled Therapeutic Interventions:  Skilled treatment session focused on completion of caregiver education with husband and pt. Education provided on current deficits and recommendations within home environment (such as monitoring left side of body etc). PT and husband with lots of questions regarding nutritional content of foods (not textures). SLP unable to answer questions regarding sugar/carbohydrate content. SLP contacted CSW who placed requested for RD to leave info with family.      Patient has met 1 of 5 long term goals.  Patient to discharge at [facility] level.  Reasons goals not met: - Pt discharged sooner than expected  Clinical Impression/Discharge Summary:   Pt with progress towards ST goals but continues to require Min A to supervision cues and will require 24 hour supervision by spouse. All education completed. Pt would continue to benefit from services to address awareness, impulsivity and complex problem solving.  Care Partner:  Caregiver Able to Provide Assistance: Yes  Type of Caregiver Assistance: Physical;Cognitive  Recommendation:  Home Health SLP;24 hour supervision/assistance;Outpatient SLP  Rationale for SLP Follow Up: Maximize cognitive function and independence;Maximize swallowing safety;Reduce caregiver burden   Equipment:     Reasons for discharge: Discharged from hospital   Patient/Family Agrees with Progress Made and Goals Achieved: Yes   Function:    Cognition Comprehension Comprehension assist level: Understands basic 90% of the time/cues < 10% of the time  Expression   Expression assist level: Expresses basic 90% of the time/requires cueing < 10% of the time.  Social Interaction Social Interaction assist level: Interacts  appropriately 75 - 89% of the time - Needs redirection for appropriate language or to initiate interaction.  Problem Solving Problem solving assist level: Solves basic 50 - 74% of the time/requires cueing 25 - 49% of the time  Memory Memory assist level: Recognizes or recalls 75 - 89% of the time/requires cueing 10 - 24% of the time   Violeta Lecount 03/16/2017, 1:35 PM

## 2017-03-16 NOTE — Progress Notes (Signed)
Occupational Therapy Discharge Summary  Patient Details  Name: Kristen Short MRN: [id number] Date of Birth: [birth year]  Today's Date: 03/16/2017 OT Individual Time: 1450-1530 OT Individual Time Calculation (min): 40 min   Patient has met 11 of 11 long term goals due to improved activity tolerance, improved balance, postural control, ability to compensate for deficits, functional use of  LEFT upper and LEFT lower extremity, improved attention, improved awareness and improved coordination.  Patient to discharge at overall Supervision level.  Patient's care partner is independent to provide the necessary cognitive and physical assistance at discharge for higher level iADL tasks.  Reasons goals not met: n/a  Recommendation:  Patient will benefit from ongoing skilled OT services in home health setting to continue to advance functional skills in the area of BADL.  Equipment: tub transfer bench, 3-in-1 BSC, quad cane  Reasons for discharge: treatment goals met and discharge from hospital  Patient/family agrees with progress made and goals achieved: Yes  OT Discharge Precautions/Restrictions  Precautions Precautions: Fall;Other (comment) Precaution Comments: L inattention Restrictions Weight Bearing Restrictions: No ADL ADL Equipment Provided: (elastic shoe laces) Eating: Set up Grooming: Supervision/safety Upper Body Bathing: Supervision/safety Lower Body Bathing: Supervision/safety Upper Body Dressing: Supervision/safety Lower Body Dressing: Supervision/safety Toileting: Supervision/safety Toilet Transfer: Close supervision Toilet Transfer Method: Ambulating Tub/Shower Transfer: Close supervison Clinical cytogeneticist Method: Optometrist: Radio broadcast assistant ADL Comments: Please see functional navigator Vision Baseline Vision/History: No visual deficits Vision Assessment?: Yes Eye Alignment: Within Functional Limits Ocular Range of Motion: Within Functional  Limits Alignment/Gaze Preference: Gaze right Additional Comments: Likely L visual field cut Perception  Perception: Impaired Inattention/Neglect: Does not attend to left visual field Praxis Praxis: Intact Praxis Impairment Details: Perseveration Cognition Overall Cognitive Status: Impaired/Different from baseline Arousal/Alertness: Awake/alert Orientation Level: Oriented X4 Attention: Sustained Sustained Attention: Appears intact Memory: Impaired Memory Impairment: Retrieval deficit;Decreased short term memory Decreased Short Term Memory: Verbal complex;Functional complex Awareness: Impaired Awareness Impairment: Emergent impairment;Anticipatory impairment Problem Solving: Impaired Problem Solving Impairment: Verbal complex;Functional complex Executive Function: Reasoning;Decision Making Reasoning: Impaired Reasoning Impairment: Verbal complex;Functional complex Decision Making: Impaired Decision Making Impairment: Verbal complex;Functional complex Behaviors: Impulsive Safety/Judgment: Impaired Comments:  impulsive and demonstrates decreased overall safety awareness Sensation Sensation Light Touch: Impaired by gross assessment(endorses some numbness behind L knee) Light Touch Impaired Details: Impaired LUE Proprioception: Impaired Detail Proprioception Impaired Details: Impaired LUE;Impaired LLE Coordination Gross Motor Movements are Fluid and Coordinated: No Fine Motor Movements are Fluid and Coordinated: No Coordination and Movement Description: ongoing hemiparesis in UE>LE on L Motor  Motor Motor: Hemiplegia;Abnormal tone;Abnormal postural alignment and control Motor - Discharge Observations: ongoing hemiparesis in LUE>LLE Mobility  Bed Mobility Bed Mobility: Right Sidelying to Sit;Sitting - Scoot to Edge of Bed Rolling Right: 5: Supervision Right Sidelying to Sit: 5: Supervision Sitting - Scoot to Edge of Bed: 5: Supervision Transfers Sit to Stand: 5:  Supervision  Trunk/Postural Assessment  Cervical Assessment Cervical Assessment: Within Functional Limits Thoracic Assessment Thoracic Assessment: Within Functional Limits Lumbar Assessment Lumbar Assessment: Within Functional Limits Postural Control Postural Control: Within Functional Limits  Balance Balance Balance Assessed: Yes Static Sitting Balance Static Sitting - Level of Assistance: 6: Modified independent (Device/Increase time) Dynamic Sitting Balance Dynamic Sitting - Level of Assistance: 5: Stand by assistance Static Standing Balance Static Standing - Level of Assistance: 5: Stand by assistance Dynamic Standing Balance Dynamic Standing - Level of Assistance: 5: Stand by assistance Extremity/Trunk Assessment RUE Assessment RUE Assessment: Within Functional Limits LUE Assessment LUE Assessment: Exceptions to [facility] LUE Tone  LUE Tone: Modified Ashworth Modified Ashworth Scale for Grading Hypertonia LUE: Slight increase in muscle tone, manifested by a catch, followed by minimal resistance throughout the remainder (less than half) of the ROM LUE Tone Comments: Brunnstrom level III   See Function Navigator for Current Functional Status.  Kristen Short Kristen Short 03/16/2017, 4:14 PM

## 2017-03-16 NOTE — Progress Notes (Signed)
Physical Therapy Discharge Summary  Patient Details  Name: Kristen Short MRN: [id number] Date of Birth: 21-Nov-1965  Today's Date: 03/16/2017 PT Individual Time: 1345-1445 PT Individual Time Calculation (min): 60 min    Patient has met 10 of 10 long term goals due to improved activity tolerance, improved balance, improved postural control, increased strength, decreased pain, functional use of  left upper extremity and left lower extremity, improved attention, improved awareness and improved coordination.  Patient to discharge at an ambulatory level Supervision.   Patient's care partner is independent to provide the necessary cognitive assistance at discharge.  Recommendation:  Patient will benefit from ongoing skilled PT services in home health setting to continue to advance safe functional mobility, address ongoing impairments in attention, safety awareness, and strength, and minimize fall risk.  Equipment: small based quad cane  Reasons for discharge: treatment goals met  Patient/family agrees with progress made and goals achieved: Yes   Skilled PT Intervention: Session focus on family education.  PT provided extension verbal education to pt's husband and sister regarding pt's current level of function and L attention deficits vs L visual field cut.  Demonstrated all functional transfers, car transfers, gait, and stair negotiation with [facility] and supervision, providing education regarding staying on pt's L side and providing tactile cues for L turns.  Pt's sister and pt's husband asked appropriate questions.  Signed off for family to provide assist in room.  Left with call bell in reach and needs met.   PT Discharge Precautions/Restrictions Precautions Precautions: Fall;Other (comment) Precaution Comments: L inattention Vision/Perception  Vision - Assessment Eye Alignment: Within Functional Limits Ocular Range of Motion: Within Functional Limits Perception Perception:  Impaired Inattention/Neglect: Does not attend to left visual field Praxis Praxis: Impaired Praxis Impairment Details: Perseveration  Cognition Overall Cognitive Status: Impaired/Different from baseline Arousal/Alertness: Awake/alert Orientation Level: Oriented X4 Attention: Sustained Sustained Attention: Appears intact Memory: Impaired Memory Impairment: Retrieval deficit;Decreased short term memory Decreased Short Term Memory: Verbal complex;Functional complex Awareness: Impaired Awareness Impairment: Emergent impairment;Anticipatory impairment Problem Solving: Impaired Problem Solving Impairment: Verbal complex;Functional complex Executive Function: Reasoning;Decision Making Reasoning: Impaired Reasoning Impairment: Verbal complex;Functional complex Decision Making: Impaired Decision Making Impairment: Verbal complex;Functional complex Behaviors: Impulsive Safety/Judgment: Impaired Comments:  impulsive and demonstrates decreased overall safety awareness Sensation Sensation Light Touch: Impaired by gross assessment(endorses some numbness behind L knee) Proprioception: Impaired Detail Proprioception Impaired Details: Impaired LUE;Impaired LLE Coordination Gross Motor Movements are Fluid and Coordinated: No Fine Motor Movements are Fluid and Coordinated: No Coordination and Movement Description: ongoing hemiparesis in UE>LE on L Motor  Motor Motor: Hemiplegia;Abnormal tone;Abnormal postural alignment and control Motor - Discharge Observations: ongoing hemiparesis in LUE>LLE  Mobility Bed Mobility Bed Mobility: Right Sidelying to Sit;Sitting - Scoot to Edge of Bed Rolling Right: 5: Supervision Right Sidelying to Sit: 5: Supervision Sitting - Scoot to Edge of Bed: 5: Supervision Transfers Transfers: Yes Sit to Stand: 5: Supervision Stand Pivot Transfers: 5: Supervision Locomotion  Ambulation Ambulation: Yes Ambulation/Gait Assistance: 5: Supervision Ambulation  Distance (Feet): 300 Feet Assistive device: Small based quad cane Stairs / Additional Locomotion Stairs: Yes Stairs Assistance: 5: Supervision Stair Management Technique: One rail Right Number of Stairs: 12 Ramp: 5: Supervision Curb: 4: Min Administrator Mobility: No  Trunk/Postural Assessment  Cervical Assessment Cervical Assessment: Within Functional Limits Thoracic Assessment Thoracic Assessment: Within Functional Limits Lumbar Assessment Lumbar Assessment: Within Functional Limits Postural Control Postural Control: Within Functional Limits  Balance Balance Balance Assessed: Yes Static Sitting Balance Static Sitting - Level  of Assistance: 6: Modified independent (Device/Increase time) Dynamic Sitting Balance Dynamic Sitting - Level of Assistance: 5: Stand by assistance Static Standing Balance Static Standing - Level of Assistance: 5: Stand by assistance Dynamic Standing Balance Dynamic Standing - Level of Assistance: 5: Stand by assistance Extremity Assessment      RLE Assessment RLE Assessment: Within Functional Limits LLE Strength LLE Overall Strength Comments: grossly 3+/5 for functional mobility, unable to formally assess 2/2 language barrier   See Function Navigator for Current Functional Status.  Kristen Short 03/16/2017, 2:31 PM

## 2017-03-16 NOTE — Progress Notes (Signed)
Social Work  Discharge Note  The overall goal for the admission was met for: SAT DC 03/17/2017  Discharge location: Yes-HOME TO SISTER'S AND [facility] WHERE SISTER CAN PROVIDE 24 HR SUPERVISION. PLAN TO MOVE HERE FROM [place]  Length of Stay: Yes-12 DAYS  Discharge activity level: Yes-SUPERVISION LEVEL  Home/community participation: Yes  Services provided included: MD, RD, PT, OT, SLP, RN, CM, TR, Pharmacy and SW  Financial Services: Private Insurance: [facility]  Follow-up services arranged: Home Health: [facility]-PT,OT,SP, DME: [facility], [facility], [facility] and Patient/Family has no preference for HH/DME agencies  Comments (or additional information):FAMILY HERE FOR TRAINING AND AWARE OF HER NEED FOR 24 HR SUPERVISION AT HOME. [facility] TO FOLLOW FOR PCP APPOINTMENT 2/14 @ 10;15 AM  Patient/Family verbalized understanding of follow-up arrangements: Yes  Individual responsible for coordination of the follow-up plan: SELF, HUSBAND AND SISTER  Confirmed correct DME delivered: Elease Hashimoto 03/16/2017    Elease Hashimoto

## 2017-03-16 NOTE — Progress Notes (Signed)
Occupational Therapy Session Note  Patient Details  Name: Kristen BaltimoreHwoc Forton MRN: [id number] Date of Birth: 02-11-1965  Today's Date: 03/16/2017 OT Individual Time: 1105-1205 OT Individual Time Calculation (min): 60 min    Short Term Goals: Week 2:  OT Short Term Goal 1 (Week 2): STG=LTG 2/2 ELOS  Skilled Therapeutic Interventions/Progress Updates:    OT treatment session focused on L NMR, NMES, and functional ambulation. Pt donned pants seated in recliner with set-up A. Sit<>stand without assistance to pull up pants. Pt donned shoes, then ambulated to the therapy gym with quad cane and close supervision, 1 intermittent LOB requiring min A to correct. Pt brought into gravity eliminated side-lying position for L UE NMR using rolling hand splint. Pt with good triceps, scap, and shoulder activation without gravity. Progressed to sitting neuro re-ed using tilting table and dysom. 1:1 NMES applied to CH1 supraspinatus and middle deltoid to help approximate shoulder joint to reduce sublux and reduce pain. CH2. Wrist extensors.  Ratio 1:3 Rate 35 pps Waveform- Asymmetric Ramp 1.0 Pulse 300 CH1 Intensity- 20 Duration- 15  CH2 Intensity- 18 Duration - 15     Report of pain at the beginning of session  Report of pain at the end of session  No adverse reactions after treatment and is skin intact.    Therapy Documentation Precautions:  Precautions Precautions: Fall, Other (comment) Precaution Comments: L hemiplegia; decreased L attention Restrictions Weight Bearing Restrictions: No  See Function Navigator for Current Functional Status.   Therapy/Group: Individual Therapy  Mal Amabilelisabeth S Idaly Verret 03/16/2017, 11:49 AM

## 2017-03-16 NOTE — Progress Notes (Signed)
Social Work Patient ID: Kristen BaltimoreHwoc Derrig, female   DOB: [birth year], 52 y.o.   MRN: [id number]  Husband is here to go through family training in preparation for discharge tomorrow. Both aware she will need 24 hr supervision at home. [facility]. Will try to set up with PCP prior to discharge tomorrow.

## 2017-03-17 DIAGNOSIS — I69319 Unspecified symptoms and signs involving cognitive functions following cerebral infarction: Secondary | ICD-10-CM

## 2017-03-17 DIAGNOSIS — G8194 Hemiplegia, unspecified affecting left nondominant side: Secondary | ICD-10-CM

## 2017-03-17 DIAGNOSIS — L299 Pruritus, unspecified: Secondary | ICD-10-CM

## 2017-03-17 LAB — GLUCOSE, CAPILLARY: GLUCOSE-CAPILLARY: 166 mg/dL — AB (ref 65–99)

## 2017-03-17 NOTE — Progress Notes (Signed)
Subjective/Complaints: Patient seen lying in bed this morning. No reported issues overnight. Husband appears to be in agreement and ready for discharge.  Review of systems: Difficult to obtain secondary to language  Objective: Vital Signs: Blood pressure 108/60, pulse 68, temperature 98.2 F (36.8 C), temperature source Oral, resp. rate 18, weight 75.5 kg (166 lb 7.2 oz), SpO2 100 %. No results found. Results for orders placed or performed during the hospital encounter of 03/05/17 (from the past 72 hour(s))  Glucose, capillary     Status: Abnormal   Collection Time: 03/14/17 12:04 PM  Result Value Ref Range   Glucose-Capillary 160 (H) 65 - 99 mg/dL  Glucose, capillary     Status: Abnormal   Collection Time: 03/14/17  5:12 PM  Result Value Ref Range   Glucose-Capillary 113 (H) 65 - 99 mg/dL  Glucose, capillary     Status: Abnormal   Collection Time: 03/14/17  8:46 PM  Result Value Ref Range   Glucose-Capillary 293 (H) 65 - 99 mg/dL  Glucose, capillary     Status: Abnormal   Collection Time: 03/15/17  6:44 AM  Result Value Ref Range   Glucose-Capillary 152 (H) 65 - 99 mg/dL  Glucose, capillary     Status: None   Collection Time: 03/15/17 11:38 AM  Result Value Ref Range   Glucose-Capillary 97 65 - 99 mg/dL  Glucose, capillary     Status: Abnormal   Collection Time: 03/15/17  4:39 PM  Result Value Ref Range   Glucose-Capillary 301 (H) 65 - 99 mg/dL  Glucose, capillary     Status: Abnormal   Collection Time: 03/15/17  8:53 PM  Result Value Ref Range   Glucose-Capillary 315 (H) 65 - 99 mg/dL   Comment 1 Notify RN   Glucose, capillary     Status: Abnormal   Collection Time: 03/16/17  6:38 AM  Result Value Ref Range   Glucose-Capillary 170 (H) 65 - 99 mg/dL   Comment 1 Notify RN   Glucose, capillary     Status: None   Collection Time: 03/16/17 12:11 PM  Result Value Ref Range   Glucose-Capillary 82 65 - 99 mg/dL  Glucose, capillary     Status: Abnormal   Collection Time:  03/16/17  4:32 PM  Result Value Ref Range   Glucose-Capillary 327 (H) 65 - 99 mg/dL  Glucose, capillary     Status: Abnormal   Collection Time: 03/16/17  9:23 PM  Result Value Ref Range   Glucose-Capillary 286 (H) 65 - 99 mg/dL  Glucose, capillary     Status: Abnormal   Collection Time: 03/17/17  6:47 AM  Result Value Ref Range   Glucose-Capillary 166 (H) 65 - 99 mg/dL     HEENT:Normocephalic. Atraumatic.  Cardio: RRR and No JVD. Resp: CTA B/L and Unlabored GI: BS positive and nondistended Skin:   Loop recorder site clean dry and intact Neuro: alert Motor:  Limited due to language, not moving left upper extremity  2+/5 and left lower extremity  Musc/Skel:  no edema, no tenderness  General no acute distress. Vital signs reviewed.  Assessment/Plan: 1. 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 Physiatrist is providing close team supervision and 24 hour management of active medical problems listed below. Physiatrist and rehab team continue to assess barriers to discharge/monitor patient progress toward functional and medical goals. FIM: Function - Bathing Position: Shower  Body parts bathed by patient: Right arm, Abdomen, Chest, Left arm, Right upper leg, Right lower leg, Left lower leg, Left upper leg, Buttocks, Front perineal area Body parts bathed by helper: Right arm Assist Level: Supervision or verbal cues  Function- Upper Body Dressing/Undressing What is the patient wearing?: Pull over shirt/dress Pull over shirt/dress - Perfomed by patient: Thread/unthread right sleeve, Thread/unthread left sleeve, Put head through opening, Pull shirt over trunk Pull over shirt/dress - Perfomed by helper: Pull shirt over trunk Assist Level: Supervision or verbal cues Function - Lower Body Dressing/Undressing What is the patient wearing?:  Pants, Shoes Position: Sitting EOB Underwear - Performed by patient: Thread/unthread left underwear leg, Thread/unthread right underwear leg Underwear - Performed by helper: Pull underwear up/down Pants- Performed by patient: Thread/unthread right pants leg, Thread/unthread left pants leg, Pull pants up/down Pants- Performed by helper: Pull pants up/down Non-skid slipper socks- Performed by patient: Don/doff left sock, Don/doff right sock Non-skid slipper socks- Performed by helper: Don/doff left sock Shoes - Performed by patient: Don/doff right shoe, Don/doff left shoe Assist for footwear: Supervision/touching assist Assist for lower body dressing: Supervision or verbal cues  Function - Toileting Toileting steps completed by patient: Adjust clothing prior to toileting, Performs perineal hygiene, Adjust clothing after toileting Toileting steps completed by helper: Adjust clothing prior to toileting, Adjust clothing after toileting Toileting Assistive Devices: Grab bar or rail Assist level: Touching or steadying assistance (Pt.75%)  Function - ArchivistToilet Transfers Toilet transfer activity did not occur: Safety/medical concerns Toilet transfer assistive device: Cane Assist level to toilet: Supervision or verbal cues Assist level from toilet: Supervision or verbal cues  Function - Chair/bed transfer Chair/bed transfer activity did not occur: Safety/medical concerns Chair/bed transfer method: Stand pivot Chair/bed transfer assist level: Supervision or verbal cues Chair/bed transfer assistive device: Armrests, Cane Chair/bed transfer details: Verbal cues for precautions/safety, Verbal cues for safe use of DME/AE, Visual cues/gestures for precautions/safety  Function - Locomotion: Wheelchair Will patient use wheelchair at discharge?: No Type: Manual Assist Level: Dependent (Pt equals 0%) Assist Level: Dependent (Pt equals 0%) Assist Level: Dependent (Pt equals 0%) Turns around,maneuvers to  table,bed, and toilet,negotiates 3% grade,maneuvers on rugs and over doorsills: No Function - Locomotion: Ambulation Assistive device: Cane-quad Max distance: 300 Assist level: Supervision or verbal cues Assist level: Supervision or verbal cues Walk 50 feet with 2 turns activity did not occur: Safety/medical concerns Assist level: Supervision or verbal cues Walk 150 feet activity did not occur: Safety/medical concerns Assist level: Supervision or verbal cues Walk 10 feet on uneven surfaces activity did not occur: Safety/medical concerns Assist level: Touching or steadying assistance (Pt > 75%)  Function - Comprehension Comprehension: Auditory Comprehension assist level: Understands basic 90% of the time/cues < 10% of the time  Function - Expression Expression: Verbal Expression assist level: Expresses basic 90% of the time/requires cueing < 10% of the time.  Function - Social Interaction Social Interaction assist level: Interacts appropriately 75 - 89% of the time - Needs redirection for appropriate language or to initiate interaction.  Function - Problem Solving Problem solving assist level: Solves basic 50 - 74% of the time/requires cueing 25 - 49% of the time  Function - Memory Memory assist level: Recognizes or recalls 75 - 89% of the time/requires cueing 10 - 24% of the time Patient normally able to recall (first 3 days only): Current season, Location of own room, Staff names and faces, That he or she is in a hospital  Medical Problem List and Plan:  1.  Dense left hemiparesis, left facial weakness, left inattention with right gaze preference, diplopia with question of Left HH as well as impulsivity with cognitive deficits secondary to right MCA infarct.    D/c today   Patient to follow-up with rehabilitation and D for transitional care management in 1-2 weeks   2.  DVT Prophylaxis/Anticoagulation: Pharmaceutical: Lovenox. Check dopplers as positive for small PFO.  3. Pain  Management: tylenol prn for HA.  4. Mood: LCSW to follow for evaluation and support as mentation improves.  5. Neuropsych: This patient is not fully capable of making decisions on her  own behalf. 6. Skin/Wound Care: routine pressure relief measures.  7. Fluids/Electrolytes/Nutrition: Monitor I/O.  8. Chronic pruritis: This was a problem at home, Cont. topical cream which seems to be helping.  Cont Atarax prn at night 9. New diagnosis T2DM:  Hgb A1C- 7.5--educate patient on CM diet. Will monitor BS ac/hs. May need oral agent.     Recent Labs    03/16/17 1632 03/16/17 2123 03/17/17 0647  GLUCAP 327* 286* 166*    Glimipride 2/4, increased to 2mg  on 2/7,    Added metformin qam   Appears to be improving overall, will need further ambulatory adjustments 10. AKI resolved   BUN mildly elevated creatinine mildly elevated   11. Constipation:  Start bowel program-- add Miralax daily.  12. Dyslipidemia: On Lipitor.  LOS (Days) 12 A FACE TO FACE EVALUATION WAS PERFORMED  Kristen Short 03/17/2017, 9:50 AM

## 2017-03-17 NOTE — Discharge Instructions (Signed)
Angiogram An angiogram is an X-ray test. It is used to look at your blood vessels. For this test, a dye is put into the blood vessel being checked. The dye shows up on X-rays. It helps your doctor see if there is a blockage or other problem in the blood vessel. What happens before the procedure?  Follow your doctor's instructions about limiting what you eat or drink.  Ask your doctor if you may drink enough water to take any needed medicines the morning of the test.  Plan to have someone take you home after the test.  If you go home the same day as the test, plan to have someone stay with you for 24 hours. What happens during the procedure?  An IV tube will be put into one of your veins.  You will be given a medicine that makes you relax (sedative).  Your skin will be washed where the thin tube (catheter) will be put in. Hair may be removed from this area. The tube may be put into: ? Your upper leg area (groin). ? The fold of your arm, near your elbow. ? Your wrist.  You will be given a medicine that numbs the area where the tube will be inserted (local anesthetic).  The tube will be inserted into a blood vessel.  Using a type of X-ray (fluoroscopy) to see, your doctor will move the tube into the blood vessel to check it.  Dye will be put in through the tube. X-rays of your blood vessels will then be taken. Different doctors and hospitals may do this procedure differently. What happens after the procedure?  If the test is done through the leg, you will be kept in bed lying flat for several hours. You will be told to not bend or cross your legs.  The area where the tube was inserted will be checked often.  The pulse in your feet or wrist will be checked often.  More tests or X-rays may be done. This information is not intended to replace advice given to you by your health care provider. Make sure you discuss any questions you have with your health care provider. Document  Released: 04/21/2008 Document Revised: 07/01/2015 Document Reviewed: 06/26/2012 Elsevier Interactive Patient Education  2017 [employer] Inpatient Rehab Discharge Instructions  Kristen Short Discharge date and time: 03/16/17   Activities/Precautions/ Functional Status: Activity: no lifting, driving, or strenuous exercise for till follow up with MD Diet: cardiac diet and diabetic diet Wound Care: keep wound clean and dry   Functional status:  ___ No restrictions     ___ Walk up steps independently _X__ 24/7 supervision/assistance   ___ Walk up steps with assistance ___ Intermittent supervision/assistance  ___ Bathe/dress independently ___ Walk with walker     ___ Bathe/dress with assistance ___ Walk Independently    ___ Shower independently _X__ Walk with assistance    __X_ Shower with assistance ___ No alcohol     ___ Return to work/school ________   Special Instructions: 1. Family to handle medications and all financial decisions. 2. Wear sling when walking to prevent damage/separation of  left shoulder.      COMMUNITY REFERRALS UPON DISCHARGE:    Home Health:   PT,OT, SP  Agency:[facility] Phone:[phone]   Date of last service:03/17/2017   Medical Equipment/Items Kristen PatriciaOrdered:SBQC, 3IN1, TUB BENCH  Agency/Supplier:[facility]   [phone]   GENERAL COMMUNITY RESOURCES FOR PATIENT/FAMILY: Support Groups:[facility] SUPPORT GROUP EVERY SECOND Thursday @ 3:00-4:00 PM ON THE  REHAB UNIT QUESTIONS CONTACT [place] [phone]  STROKE/TIA DISCHARGE INSTRUCTIONS SMOKING Cigarette smoking nearly doubles your risk of having a stroke & is the single most alterable risk factor  If you smoke or have smoked in the last 12 months, you are advised to quit smoking for your health.  Most of the excess cardiovascular risk related to smoking disappears within a year of stopping.  Ask you doctor about anti-smoking medications  [place] Quit Line: 1-800-QUIT NOW  Free Smoking Cessation  Classes [phone]  CHOLESTEROL Know your levels; limit fat & cholesterol in your diet  Lipid Panel     Component Value Date/Time   CHOL 236 (H) 03/01/2017 0327   TRIG 135 03/01/2017 0327   HDL 36 (L) 03/01/2017 0327   CHOLHDL 6.6 03/01/2017 0327   VLDL 27 03/01/2017 0327   LDLCALC 173 (H) 03/01/2017 0327      Many patients benefit from treatment even if their cholesterol is at goal.  Goal: Total Cholesterol (CHOL) less than 160  Goal:  Triglycerides (TRIG) less than 150  Goal:  HDL greater than 40  Goal:  LDL (LDLCALC) less than 100   BLOOD PRESSURE American Stroke Association blood pressure target is less that 120/80 mm/Hg  Your discharge blood pressure is:  BP: (!) 114/54  Monitor your blood pressure  Limit your salt and alcohol intake  Many individuals will require more than one medication for high blood pressure  DIABETES (A1c is a blood sugar average for last 3 months) Goal HGBA1c is under 7% (HBGA1c is blood sugar average for last 3 months)  Diabetes:     Lab Results  Component Value Date   HGBA1C 7.5 (H) 03/01/2017     Your HGBA1c can be lowered with medications, healthy diet, and exercise.  Check your blood sugar as directed by your physician  Call your physician if you experience unexplained or low blood sugars.  PHYSICAL ACTIVITY/REHABILITATION Goal is 30 minutes at least 4 days per week  Activity: No driving, Therapies: See above Return to work: to be decided on follow up  Activity decreases your risk of heart attack and stroke and makes your heart stronger.  It helps control your weight and blood pressure; helps you relax and can improve your mood.  Participate in a regular exercise program.  Talk with your doctor about the best form of exercise for you (dancing, walking, swimming, cycling).  DIET/WEIGHT Goal is to maintain a healthy weight  Your discharge diet is: DIET DYS 3 Room service appropriate? Yes; Fluid consistency: Thin  liquids Your  height is:   Your current weight is: Weight: 75.5 kg (166 lb 7.2 oz) Your Body Mass Index (BMI) is:    Following the type of diet specifically designed for you will help prevent another stroke.  Your goal weight  is:  130lbs  Your goal Body Mass Index (BMI) is 19-24.  Healthy food habits can help reduce 3 risk factors for stroke:  High cholesterol, hypertension, and excess weight.  RESOURCES Stroke/Support Group:  Call [phone]   STROKE EDUCATION PROVIDED/REVIEWED AND GIVEN TO PATIENT Stroke warning signs and symptoms How to activate emergency medical system (call 911). Medications prescribed at discharge. Need for follow-up after discharge. Personal risk factors for stroke. Pneumonia vaccine given:  Flu vaccine given:  My questions have been answered, the writing is legible, and I understand these instructions.  I will adhere to these goals & educational materials that have been provided to me after my discharge from the  hospital.    My questions have been answered and I understand these instructions. I will adhere to these goals and the provided educational materials after my discharge from the hospital.  Patient/Caregiver Signature _______________________________ Date __________  Clinician Signature _______________________________________ Date __________  Please bring this form and your medication list with you to all your follow-up doctor's appointments.

## 2017-03-17 NOTE — Progress Notes (Signed)
Pt ready for discharge this AM. Discharge instructions received and reviewed previously with pt and husband. No further questions or concerns at this time. NT Rosanne SackKasey escorted pt in wheelchair to ground floor for discharge.

## 2017-03-19 ENCOUNTER — Telehealth: Payer: Self-pay | Admitting: Registered Nurse

## 2017-03-19 NOTE — Telephone Encounter (Signed)
1st Attempt: Spoke with Hlir, Chi, she states Ms. Bugarin is being cared for by her sister. She will have her sister to return my call.

## 2017-03-20 ENCOUNTER — Telehealth: Payer: Self-pay | Admitting: Registered Nurse

## 2017-03-20 NOTE — Telephone Encounter (Signed)
2nd attempt: Spoke with Hliar [phone], she's  limited with speaking english. She did asked for the appointment to be changed to 03/29/2017, arrival time at 10:00 am for 10:30 am appointment,  Left another message with Mr. Burnard Leighyun, and Hvera [phone].  regarding the above  Awaiting a return call.

## 2017-03-21 ENCOUNTER — Telehealth: Payer: Self-pay | Admitting: Registered Nurse

## 2017-03-21 NOTE — Telephone Encounter (Signed)
Transitional Care call Transitional Care Call Completed, Appointment Confirmed, Address Confirmed, New Patient Packet Mail Transitional Care Questions Answered by Mr. Ardyth HarpsHernandez ( Brother-in-Law)  Patient name: Kristen Short DOB: () 1. Are you/is patient experiencing any problems since coming home? No a. Are there any questions regarding any aspect of care? No 2. Are there any questions regarding medications administration/dosing? No a. Are meds being taken as prescribed? Yes b. "Patient should review meds with caller to confirm"  3. Have there been any falls? No 4. Has Home Health been to the house and/or have they contacted you? Mr. Ardyth HarpsHernandez believe so he wasn't sure. This provider will call [facility] a. If not, have you tried to contact them? NA b. Can we help you contact them? This Provider Called [facility]: [facility] has tried to call family several times, no one has return their call. I will also reach out to Ms. Dupree SW.  Spoke with Ms. Dupree she is awre regarding the above.  5. Are bowels and bladder emptying properly? Yes a. Are there any unexpected incontinence issues? No b. If applicable, is patient following bowel/bladder programs? () 6. Any fevers, problems with breathing, unexpected pain? No 7. Are there any skin problems or new areas of breakdown? No 8. Has the patient/family member arranged specialty MD follow up (ie cardiology/neurology/renal/surgical/etc.)?  Reviewed all appointments with Mr. Ardyth HarpsHernandez a. Can we help arrange? No 9. Does the patient need any other services or support that we can help arrange? No 10. Are caregivers following through as expected in assisting the patient? Yes 11. Has the patient quit smoking, drinking alcohol, or using drugs as recommended? Yes  Appointment date/time 03/29/2017, arrival time 10:00 for 10:30 am appointment  with Dr. Wynn BankerKirsteins [address] [phone]

## 2017-03-22 ENCOUNTER — Ambulatory Visit (INDEPENDENT_AMBULATORY_CARE_PROVIDER_SITE_OTHER): Payer: 59 | Admitting: Internal Medicine

## 2017-03-22 ENCOUNTER — Other Ambulatory Visit: Payer: Self-pay

## 2017-03-22 ENCOUNTER — Encounter: Payer: Self-pay | Admitting: Internal Medicine

## 2017-03-22 VITALS — BP 116/82 | HR 82 | Temp 98.2°F | Ht 62.0 in | Wt 163.4 lb

## 2017-03-22 DIAGNOSIS — I69392 Facial weakness following cerebral infarction: Secondary | ICD-10-CM

## 2017-03-22 DIAGNOSIS — Q211 Atrial septal defect: Secondary | ICD-10-CM | POA: Diagnosis not present

## 2017-03-22 DIAGNOSIS — I69354 Hemiplegia and hemiparesis following cerebral infarction affecting left non-dominant side: Secondary | ICD-10-CM | POA: Diagnosis not present

## 2017-03-22 DIAGNOSIS — Z8673 Personal history of transient ischemic attack (TIA), and cerebral infarction without residual deficits: Secondary | ICD-10-CM

## 2017-03-22 DIAGNOSIS — Z7984 Long term (current) use of oral hypoglycemic drugs: Secondary | ICD-10-CM

## 2017-03-22 DIAGNOSIS — G8194 Hemiplegia, unspecified affecting left nondominant side: Secondary | ICD-10-CM

## 2017-03-22 DIAGNOSIS — E119 Type 2 diabetes mellitus without complications: Secondary | ICD-10-CM

## 2017-03-22 DIAGNOSIS — Z7982 Long term (current) use of aspirin: Secondary | ICD-10-CM | POA: Diagnosis not present

## 2017-03-22 DIAGNOSIS — R1310 Dysphagia, unspecified: Secondary | ICD-10-CM | POA: Diagnosis not present

## 2017-03-22 MED ORDER — METFORMIN HCL 500 MG PO TABS: 500.0000 mg | ORAL_TABLET | Freq: Two times a day (BID) | ORAL | 1 refills | Status: DC

## 2017-03-22 NOTE — Progress Notes (Signed)
CC: establishing care in the clinic, follow up after hospitalization for stroke and new diagnosis of diabetes   HPI:  [name] is a 52 y.o. with PMH as listed below who presents for follow up after hospitalization for stroke and establishing care. Please see the assessment and plans for the status of the patient chronic medical problems.   Past Medical History:  Diagnosis Date  . CVA (cerebral vascular accident) (HCC)   . Itching    chronic issues  . Left wrist fracture    Past Surgical History:  Procedure Laterality Date  . LOOP RECORDER INSERTION N/A 03/05/2017   Procedure: LOOP RECORDER INSERTION;  Surgeon: Marinus Maw, MD;  Location: [facility] INVASIVE CV LAB;  Service: Cardiovascular;  Laterality: N/A;  . TEE WITHOUT CARDIOVERSION N/A 03/05/2017   Procedure: TRANSESOPHAGEAL ECHOCARDIOGRAM (TEE);  Surgeon: Wendall Stade, MD;  Location: [facility] ENDOSCOPY;  Service: Cardiovascular;  Laterality: N/A;   Family History  Problem Relation Age of Onset  . High blood pressure Sister   . Heart Problems Sister        angina due to vasospasms   Social History   Socioeconomic History  . Marital status: Married    Spouse name: None  . Number of children: None  . Years of education: None  . Highest education level: None  Social Needs  . Financial resource strain: None  . Food insecurity - worry: None  . Food insecurity - inability: None  . Transportation needs - medical: None  . Transportation needs - non-medical: None  Occupational History  . None  Tobacco Use  . Smoking status: Never Smoker  . Smokeless tobacco: Never Used  Substance and Sexual Activity  . Alcohol use: No    Frequency: Never  . Drug use: No  . Sexual activity: Yes  Other Topics Concern  . None  Social History Narrative  . None    Review of Systems:  Refer to history of present illness and assessment and plans for pertinent review of systems, all others reviewed and negative  Physical Exam:  Vitals:     03/22/17 1039  BP: 128/74  Pulse: 82  Temp: 98.2 F (36.8 C)  TempSrc: Oral  SpO2: 100%  Weight: 163 lb 6.4 oz (74.1 kg)  Height: 5\' 2"  (1.575 m)   General: well appearing, no acute distress  HEENT: no scleral icterus or erythema, conjunctiva are well perfused  Neck: supple, normal ROM  Cardiac: regular rate and rhythm, no peripheral edema  Pulm: no respiratory distress, lungs clear to auscultation  Neuro: CN II-X and XII grossly intact, weakness with left shoulder shrug but strength with facial turn intact, no facial droop, 4/5 strength with left shoulder abduction and elbow flexion, 0/5 left hand grip, strength 5/5 in right upper extremity, right lower extremity, and 4/5 of hip flexor and ankle dorsiflexion Skin: no rashes visible on the exposed face, chest, hands, or lower legs  Psych: conversational, well groomed with traditional attire   Assessment & Plan:   Hx of ischemic stroke  Hospitalized 12/29 - 1/23 after she presented with left sided weakness and decreased sensation, facial droop, and dysphagia and was found to have Right MCA with posterior watershed territory infarction on MRI. She was outside of the tPA window so risk factor optimization was the focus. Labs revealed LDL 173, A1c 7.5. TEE with bubble study showed a small patent foramen ovale and no evidence of intracardiac thrombus. A loop recorder was placed and she was scheduled  for neurology follow up in 6 weeks. [facility] half [facility]. She has residual deficits of left sided hemiparesis, facial weakness and inattention and right gaze preference. Today she says her weakness has continued to improve since the time of discharge from CIR - her lower extremity strength is a little bit better but she has not regained strength of her right hand. The dysphagia has resolved.  - referral to HHPT OT and SLP - continue atorvastatin 80 mg daily  - continue aspirin 325 mg daily  - continue to  monitor blood pressure   Diabetes  Found to have A1c 7.5 in the setting of admission for CVA. Blood glucose was controlled with sliding scale while inpatient. She reports no side effects of current medications  - increase metformin to 500 mg BID ( prev 500 mg daily ) - stop glimepiride 2 mg daily   See Encounters Tab for problem based charting.  Patient discussed with Dr. Oswaldo DoneVincent

## 2017-03-22 NOTE — Patient Instructions (Addendum)
It was a pleasure to see you today Ms. Faison,   For your diabetes please stop taking the glimepiride and take the metformin 500 mg twice daily  Continue to take the atorvastatin 80 mg daily and aspirin 325 mg daily  See Dr. Pearlean BrownieSethi at your appointment scheduled in April  I will try to figure out why the home health therapist have not gotten in contact with you yet. If you havent heard back about this within the next week please give our office a call for an update.  Schedule a check up appointment to meet your primary care doctor in about 1-2 months   FOLLOW-UP INSTRUCTIONS When: 1-2 months with your primary care doctor  For: check of your cholesterol  What to bring: all of your medication bottles   - Please call our clinic if you have any problems or questions, we may be able to help you and keep you from a long emergency room wait. Our clinic and after hours phone number is [phone]

## 2017-03-25 ENCOUNTER — Encounter: Payer: Self-pay | Admitting: Internal Medicine

## 2017-03-25 MED ORDER — METFORMIN HCL 1000 MG PO TABS: 1000.0000 mg | ORAL_TABLET | Freq: Every day | ORAL | 1 refills | Status: DC

## 2017-03-25 MED ORDER — ATORVASTATIN CALCIUM 80 MG PO TABS: 80.0000 mg | ORAL_TABLET | Freq: Every day | ORAL | 1 refills | Status: DC

## 2017-03-25 NOTE — Assessment & Plan Note (Signed)
Hospitalized 12/29 - 1/23 after she presented with left sided weakness and decreased sensation, facial droop, and dysphagia and was found to have Right MCA with posterior watershed territory infarction on MRI. She was outside of the tPA window so risk factor optimization was the focus. Labs revealed LDL 173, A1c 7.5. TEE with bubble study showed a small patent foramen ovale and no evidence of intracardiac thrombus. A loop recorder was placed and she was scheduled for neurology follow up in 6 weeks. [facility] a half [facility]. She has residual deficits of left sided hemiparesis, facial weakness and inattention and right gaze preference. Today she says her weakness has continued to improve since the time of discharge from CIR - her lower extremity strength is a little bit better but she has not regained strength of her right hand. The dysphagia has resolved.  - referral to HHPT OT and SLP - continue atorvastatin 80 mg daily  - continue aspirin 325 mg daily  - continue to monitor blood pressure

## 2017-03-25 NOTE — Assessment & Plan Note (Signed)
Found to have A1c 7.5 in the setting of admission for CVA. Blood glucose was controlled with sliding scale while inpatient. She reports no side effects of current medications  - increase metformin to 500 mg BID ( prev 500 mg daily ) - stop glimepiride 2 mg daily

## 2017-03-26 NOTE — Progress Notes (Signed)
Internal Medicine Clinic Attending  Case discussed with Dr. Blum at the time of the visit.  We reviewed the resident's history and exam and pertinent patient test results.  I agree with the assessment, diagnosis, and plan of care documented in the resident's note. 

## 2017-03-29 ENCOUNTER — Ambulatory Visit: Payer: 59 | Admitting: Physical Medicine & Rehabilitation

## 2017-03-29 ENCOUNTER — Encounter: Payer: Self-pay | Admitting: Physical Medicine & Rehabilitation

## 2017-03-29 ENCOUNTER — Encounter: Payer: 59 | Attending: Physical Medicine & Rehabilitation

## 2017-03-29 VITALS — BP 130/82 | HR 88

## 2017-03-29 DIAGNOSIS — G8194 Hemiplegia, unspecified affecting left nondominant side: Secondary | ICD-10-CM

## 2017-03-29 DIAGNOSIS — M25512 Pain in left shoulder: Secondary | ICD-10-CM | POA: Diagnosis not present

## 2017-03-29 DIAGNOSIS — I63511 Cerebral infarction due to unspecified occlusion or stenosis of right middle cerebral artery: Secondary | ICD-10-CM | POA: Insufficient documentation

## 2017-03-29 NOTE — Progress Notes (Signed)
Subjective:    Patient ID: Kristen Short, female    DOB: December 04, 1965, 52 y.o.   MRN: [id number]  52 y.o. female with no known prior medical history and  immigrated from [place] a year ago.  She was admitted on 02/28/17 with reports of HA since last Saturday followed by left sided weakness and difficulty walking.  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  MRI brain showed acute large right frontal MCA, right posterior watershed territory infarcts and subacute small right frontal lobe/MCA infarct with regional mass effect.  Dr. Pearlean Brownie recommended full workup-- TEE was negative for thrombus or aortic debris but showed small PFO with positive bubble study. Loop recorder placed by cardiology  Admit date: 03/05/2017 Discharge date: 03/16/2017  HPI   Here with interpreter Husband helps with dressing and bathing No falls HHPT/OT starting Has seen PCP, has appt with Neuro  Left shoulder pain when pt sleeps on that side Also pain with elevation of Left arm Amb without assistive device  Pain Inventory Average Pain 4 Pain Right Now 0 My pain is dull  In the last 24 hours, has pain interfered with the following? General activity 2 Relation with others 2 Enjoyment of life 2 What TIME of day is your pain at its worst? night Sleep (in general) Good  Pain is worse with: . Pain improves with: rest Relief from Meds: 6  Mobility walk without assistance use a cane ability to climb steps?  yes do you drive?  no  Function not employed: date last employed . I need assistance with the following:  feeding, dressing, bathing, toileting, meal prep, household duties and shopping  Neuro/Psych No problems in this area  Prior Studies Any changes since last visit?  no  Physicians involved in your care Any changes since last  visit?  no   Family History  Problem Relation Age of Onset  . High blood pressure Sister   . Heart Problems Sister        angina due to vasospasms   Social History   Socioeconomic History  . Marital status: Married    Spouse name: Not on file  . Number of children: Not on file  . Years of education: Not on file  . Highest education level: Not on file  Social Needs  . Financial resource strain: Not on file  . Food insecurity - worry: Not on file  . Food insecurity - inability: Not on file  . Transportation needs - medical: Not on file  . Transportation needs - non-medical: Not on file  Occupational History  . Not on file  Tobacco Use  . Smoking status: Never Smoker  . Smokeless tobacco: Never Used  Substance and Sexual Activity  . Alcohol use: No    Frequency: Never  . Drug use: No  . Sexual activity: Yes  Other Topics Concern  . Not on file  Social History Narrative  . Not on file   Past Surgical History:  Procedure Laterality Date  . LOOP RECORDER INSERTION N/A 03/05/2017   Procedure: LOOP RECORDER INSERTION;  Surgeon: Marinus Maw, MD;  Location: [facility] INVASIVE CV LAB;  Service: Cardiovascular;  Laterality: N/A;  . TEE WITHOUT CARDIOVERSION N/A 03/05/2017   Procedure: TRANSESOPHAGEAL ECHOCARDIOGRAM (TEE);  Surgeon: Wendall Stade, MD;  Location: [facility] ENDOSCOPY;  Service: Cardiovascular;  Laterality: N/A;   Past  Medical History:  Diagnosis Date  . CVA (cerebral vascular accident) (HCC)   . Left wrist fracture    There were no vitals taken for this visit.  Opioid Risk Score:   Fall Risk Score:  `1  Depression screen PHQ 2/9  Depression screen PHQ 2/9 03/22/2017  Decreased Interest 1  Down, Depressed, Hopeless 1  PHQ - 2 Score 2  Altered sleeping 3  Tired, decreased energy 3  Change in appetite 0  Feeling bad or failure about yourself  0  Trouble concentrating 0  Moving slowly or fidgety/restless 3  Suicidal thoughts 0  PHQ-9 Score 11  Difficult doing  work/chores Very difficult     Review of Systems  Constitutional: Positive for diaphoresis.  HENT: Negative.   Eyes: Negative.   Respiratory: Negative.   Cardiovascular: Negative.   Gastrointestinal: Negative.   Endocrine: Negative.   Genitourinary: Negative.   Musculoskeletal: Negative.   Skin: Positive for rash.  Allergic/Immunologic: Negative.   Neurological: Negative.   Hematological: Negative.   Psychiatric/Behavioral: Negative.   All other systems reviewed and are negative.      Objective:   Physical Exam  Constitutional: She appears well-developed and well-nourished. No distress.  HENT:  Head: Normocephalic and atraumatic.  Eyes: Conjunctivae and EOM are normal. Pupils are equal, round, and reactive to light.  Neck: Normal range of motion.  Cardiovascular: Normal rate, regular rhythm and normal heart sounds. Exam reveals no friction rub.  No murmur heard. Pulmonary/Chest: Effort normal and breath sounds normal. No respiratory distress. She has no wheezes.  Abdominal: Soft. Bowel sounds are normal. She exhibits no distension. There is no tenderness.  Neurological: She is alert.  Skin: She is not diaphoretic.  Psychiatric: She has a normal mood and affect.  Nursing note and vitals reviewed.  Motor 5/5 on RIght side  4- LLE 2- Left delt and biceps, 0/5 Left triceps and trace right grip       Assessment & Plan:  1.  Right MCA infarct with left hemiparesis, neglect is improving. LUE strength still markedly impaired but tone is not sig elevated Cont HHPT, OT 2.  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  Have discussed trigger point inj wihich pt declines  PMR visit in 87month  Cont with IM/PCP f/u in March Neuro f/u in April

## 2017-03-29 NOTE — Patient Instructions (Signed)
Trigger Point Injections Patient Information  Description: Trigger points are areas of muscle sensitive to touch which cause pain with movement, sometimes felt some distance from the site of palpation.  Usually the muscle containing these trigger points if felt as a tight band or knot.   The area of maximum tenderness or trigger point is identified, and after antiseptic preparation of the skin, a small needle is placed into this site.  Reproduction of the pain often occurs and numbing medicine (local anesthetic) is injected into the site, sometimes along with steroid preparation.  The entire block usually lasts less than 5 minutes.  Conditions which may be treated by trigger points:   Muscular pain and spasm  Nerve irritation  Preparation for the injection:  1. Do not eat any solid food or dairy products within 8 hours of your appointment. 2. You may drink clear liquids up to 3 hours before appointment.  Clear liquids include water, black coffee, juice or soda.  No milk or cream please. 3. You may take your regular medications, including pain medications, with a sip of water before your appointment.  Diabetics should hold regular insulin ( if take separately) and take 1/2 normal NPH dose the morning of the procedure.  Carry some sugar containing items with you to your appointment. 4. A driver must accompany you and be prepared to drive you home after your procedure.  5. Bring all your current medications with you. 6. An IV may be inserted and sedation may be given at the discretion of the physician.  7. A blood pressure cuff, EKG, and other monitors will often be applied during the procedure.  Some patients may need to have extra oxygen administered for a short period. 8. You will be asked to provide medical information, including your allergies and medications, prior to the procedure.  We must know immediately if you are taking blood thinners (like Coumadin/Warfarin) or if you are allergic to  IV iodine contrast (dye).  We must know if you could possibly be pregnant.  Possible side-effects:   Bleeding from needle site  Infection (rare, may require surgery)  Nerve injury (rare)  Numbness & tingling (temporary)  Punctured lung (if injection around chest)  Light-headedness (temporary)  Pain at injection site (several days)  Decreased blood pressure (rare, temporary)  Weakness in arm/leg (temporary)  Call if you experience:   Hive or difficulty breathing (go to the emergency room)  Inflammation or drainage at the injection site(s)  Please note:  Although the local anesthetic injected can often make your painful muscle feel good for several hours after the injection, the pain may return.  It takes 3-7 days for steroids to work.  You may not notice any pain relief for at least one week.  If effective, we will often do a series of injections spaced 3-6 weeks apart to maximally decrease your pain.  If you have any questions please call [phone] [facility] 

## 2017-03-30 ENCOUNTER — Encounter: Payer: 59 | Admitting: Physical Medicine & Rehabilitation

## 2017-04-03 ENCOUNTER — Telehealth: Payer: Self-pay | Admitting: General Practice

## 2017-04-03 NOTE — Telephone Encounter (Signed)
Lurena JoinerRebecca calling from [facility] [phone] for a verbal order speech therapy 2x a week for 2weeks

## 2017-04-04 ENCOUNTER — Ambulatory Visit (INDEPENDENT_AMBULATORY_CARE_PROVIDER_SITE_OTHER): Payer: Self-pay | Admitting: *Deleted

## 2017-04-04 ENCOUNTER — Ambulatory Visit (INDEPENDENT_AMBULATORY_CARE_PROVIDER_SITE_OTHER): Payer: 59 | Admitting: *Deleted

## 2017-04-04 DIAGNOSIS — I639 Cerebral infarction, unspecified: Secondary | ICD-10-CM | POA: Diagnosis not present

## 2017-04-04 DIAGNOSIS — Z8673 Personal history of transient ischemic attack (TIA), and cerebral infarction without residual deficits: Secondary | ICD-10-CM

## 2017-04-04 NOTE — Progress Notes (Signed)
Wound LINQ check in clinic, steri-strips removed, wound well healed incision edges approximated. Pt and family instructed on home monitor via iPad interpreter. Pt and family voiced understanding. No symptom, tachy or AT/AF episodes.

## 2017-04-05 ENCOUNTER — Telehealth: Payer: Self-pay | Admitting: *Deleted

## 2017-04-05 NOTE — Progress Notes (Signed)
Carelink Summary Report / Loop Recorder 

## 2017-04-05 NOTE — Telephone Encounter (Signed)
Nelly LaurenceKathryn Fleihan, OT, [facility] asking for HHOT 1week1 followed by 2week3. Verbal orders given per office protocol. Also she is asking for a verbal order for E-STIM  on the left shoulder for subluxation. Please advise

## 2017-04-05 NOTE — Telephone Encounter (Signed)
May order estim for shoulder sublux on Left side

## 2017-04-06 ENCOUNTER — Other Ambulatory Visit: Payer: Self-pay | Admitting: Internal Medicine

## 2017-04-06 NOTE — Telephone Encounter (Signed)
Return call made to Lurena JoinerRebecca ([facility]) to follow up speech therapy request-She informed me that [facility] had also come out to see patient and therefore her agency will not be able to provide duplicate services.  Confirmed with front office that pt is currently receiving services from [facility]-no further action needed.Criss Alvine.Goldston, Darlene Cassady3/1/201911:07 AM

## 2017-04-11 NOTE — Telephone Encounter (Signed)
Contacted PT and notified

## 2017-04-18 ENCOUNTER — Other Ambulatory Visit: Payer: Self-pay

## 2017-04-18 ENCOUNTER — Ambulatory Visit: Payer: 59 | Admitting: Internal Medicine

## 2017-04-18 VITALS — BP 133/106 | HR 75 | Temp 98.2°F | Wt 160.8 lb

## 2017-04-18 DIAGNOSIS — I69354 Hemiplegia and hemiparesis following cerebral infarction affecting left non-dominant side: Secondary | ICD-10-CM | POA: Diagnosis not present

## 2017-04-18 DIAGNOSIS — Z7984 Long term (current) use of oral hypoglycemic drugs: Secondary | ICD-10-CM | POA: Diagnosis not present

## 2017-04-18 DIAGNOSIS — E119 Type 2 diabetes mellitus without complications: Secondary | ICD-10-CM

## 2017-04-18 DIAGNOSIS — Z8673 Personal history of transient ischemic attack (TIA), and cerebral infarction without residual deficits: Secondary | ICD-10-CM

## 2017-04-18 DIAGNOSIS — Z79899 Other long term (current) drug therapy: Secondary | ICD-10-CM

## 2017-04-18 DIAGNOSIS — I69392 Facial weakness following cerebral infarction: Secondary | ICD-10-CM

## 2017-04-18 DIAGNOSIS — Z7982 Long term (current) use of aspirin: Secondary | ICD-10-CM | POA: Diagnosis not present

## 2017-04-18 DIAGNOSIS — M25512 Pain in left shoulder: Secondary | ICD-10-CM | POA: Diagnosis not present

## 2017-04-18 LAB — GLUCOSE, CAPILLARY: Glucose-Capillary: 125 mg/dL — ABNORMAL HIGH (ref 65–99)

## 2017-04-18 MED ORDER — CYCLOBENZAPRINE HCL 5 MG PO TABS: 5.0000 mg | ORAL_TABLET | Freq: Three times a day (TID) | ORAL | 0 refills | Status: DC | PRN

## 2017-04-18 MED ORDER — ASPIRIN EC 325 MG PO TBEC: 325.0000 mg | DELAYED_RELEASE_TABLET | Freq: Every day | ORAL | 3 refills | Status: DC

## 2017-04-18 NOTE — Patient Instructions (Addendum)
Thank you for visiting clinic today. As we discuss continue doing these exercises for your shoulder pain. I am giving you a muscle relaxant called Flexeril, you can take it up to 3 times a day, preferably just at bedtime. If your symptoms continue to get worse-we can try some physical therapy at that time as you are not interested in doing a physical therapy at currently due to cost. Follow-up in 1 month with your PCP.   Shoulder Exercises Ask your health care provider which exercises are safe for you. Do exercises exactly as told by your health care provider and adjust them as directed. It is normal to feel mild stretching, pulling, tightness, or discomfort as you do these exercises, but you should stop right away if you feel sudden pain or your pain gets worse.Do not begin these exercises until told by your health care provider. RANGE OF MOTION EXERCISES These exercises warm up your muscles and joints and improve the movement and flexibility of your shoulder. These exercises also help to relieve pain, numbness, and tingling. These exercises involve stretching your injured shoulder directly. Exercise A: Pendulum  1. Stand near a wall or a surface that you can hold onto for balance. 2. Bend at the waist and let your left / right arm hang straight down. Use your other arm to support you. Keep your back straight and do not lock your knees. 3. Relax your left / right arm and shoulder muscles, and move your hips and your trunk so your left / right arm swings freely. Your arm should swing because of the motion of your body, not because you are using your arm or shoulder muscles. 4. Keep moving your body so your arm swings in the following directions, as told by your health care provider: ? Side to side. ? Forward and backward. ? In clockwise and counterclockwise circles. 5. Continue each motion for __________ seconds, or for as long as told by your health care provider. 6. Slowly return to the  starting position. Repeat __________ times. Complete this exercise __________ times a day. Exercise B:Flexion, Standing  1. Stand and hold a broomstick, a cane, or a similar object. Place your hands a little more than shoulder-width apart on the object. Your left / right hand should be palm-up, and your other hand should be palm-down. 2. Keep your elbow straight and keep your shoulder muscles relaxed. Push the stick down with your healthy arm to raise your left / right arm in front of your body, and then over your head until you feel a stretch in your shoulder. ? Avoid shrugging your shoulder while you raise your arm. Keep your shoulder blade tucked down toward the middle of your back. 3. Hold for __________ seconds. 4. Slowly return to the starting position. Repeat __________ times. Complete this exercise __________ times a day. Exercise C: Abduction, Standing 1. Stand and hold a broomstick, a cane, or a similar object. Place your hands a little more than shoulder-width apart on the object. Your left / right hand should be palm-up, and your other hand should be palm-down. 2. While keeping your elbow straight and your shoulder muscles relaxed, push the stick across your body toward your left / right side. Raise your left / right arm to the side of your body and then over your head until you feel a stretch in your shoulder. ? Do not raise your arm above shoulder height, unless your health care provider tells you to do that. ? Avoid shrugging  your shoulder while you raise your arm. Keep your shoulder blade tucked down toward the middle of your back. 3. Hold for __________ seconds. 4. Slowly return to the starting position. Repeat __________ times. Complete this exercise __________ times a day. Exercise D:Internal Rotation  1. Place your left / right hand behind your back, palm-up. 2. Use your other hand to dangle an exercise band, a towel, or a similar object over your shoulder. Grasp the band  with your left / right hand so you are holding onto both ends. 3. Gently pull up on the band until you feel a stretch in the front of your left / right shoulder. ? Avoid shrugging your shoulder while you raise your arm. Keep your shoulder blade tucked down toward the middle of your back. 4. Hold for __________ seconds. 5. Release the stretch by letting go of the band and lowering your hands. Repeat __________ times. Complete this exercise __________ times a day. STRETCHING EXERCISES These exercises warm up your muscles and joints and improve the movement and flexibility of your shoulder. These exercises also help to relieve pain, numbness, and tingling. These exercises are done using your healthy shoulder to help stretch the muscles of your injured shoulder. Exercise E: Research officer, political party (External Rotation and Abduction)  1. Stand in a doorway with one of your feet slightly in front of the other. This is called a staggered stance. If you cannot reach your forearms to the door frame, stand facing a corner of a room. 2. Choose one of the following positions as told by your health care provider: ? Place your hands and forearms on the door frame above your head. ? Place your hands and forearms on the door frame at the height of your head. ? Place your hands on the door frame at the height of your elbows. 3. Slowly move your weight onto your front foot until you feel a stretch across your chest and in the front of your shoulders. Keep your head and chest upright and keep your abdominal muscles tight. 4. Hold for __________ seconds. 5. To release the stretch, shift your weight to your back foot. Repeat __________ times. Complete this stretch __________ times a day. Exercise F:Extension, Standing 1. Stand and hold a broomstick, a cane, or a similar object behind your back. ? Your hands should be a little wider than shoulder-width apart. ? Your palms should face away from your back. 2. Keeping your  elbows straight and keeping your shoulder muscles relaxed, move the stick away from your body until you feel a stretch in your shoulder. ? Avoid shrugging your shoulders while you move the stick. Keep your shoulder blade tucked down toward the middle of your back. 3. Hold for __________ seconds. 4. Slowly return to the starting position. Repeat __________ times. Complete this exercise __________ times a day. STRENGTHENING EXERCISES These exercises build strength and endurance in your shoulder. Endurance is the ability to use your muscles for a long time, even after they get tired. Exercise G:External Rotation  1. Sit in a stable chair without armrests. 2. Secure an exercise band at elbow height on your left / right side. 3. Place a soft object, such as a folded towel or a small pillow, between your left / right upper arm and your body to move your elbow a few inches away (about 10 cm) from your side. 4. Hold the end of the band so it is tight and there is no slack. 5. Keeping your elbow pressed  against the soft object, move your left / right forearm out, away from your abdomen. Keep your body steady so only your forearm moves. 6. Hold for __________ seconds. 7. Slowly return to the starting position. Repeat __________ times. Complete this exercise __________ times a day. Exercise H:Shoulder Abduction  1. Sit in a stable chair without armrests, or stand. 2. Hold a __________ weight in your left / right hand, or hold an exercise band with both hands. 3. Start with your arms straight down and your left / right palm facing in, toward your body. 4. Slowly lift your left / right hand out to your side. Do not lift your hand above shoulder height unless your health care provider tells you that this is safe. ? Keep your arms straight. ? Avoid shrugging your shoulder while you do this movement. Keep your shoulder blade tucked down toward the middle of your back. 5. Hold for __________  seconds. 6. Slowly lower your arm, and return to the starting position. Repeat __________ times. Complete this exercise __________ times a day. Exercise I:Shoulder Extension 1. Sit in a stable chair without armrests, or stand. 2. Secure an exercise band to a stable object in front of you where it is at shoulder height. 3. Hold one end of the exercise band in each hand. Your palms should face each other. 4. Straighten your elbows and lift your hands up to shoulder height. 5. Step back, away from the secured end of the exercise band, until the band is tight and there is no slack. 6. Squeeze your shoulder blades together as you pull your hands down to the sides of your thighs. Stop when your hands are straight down by your sides. Do not let your hands go behind your body. 7. Hold for __________ seconds. 8. Slowly return to the starting position. Repeat __________ times. Complete this exercise __________ times a day. Exercise J:Standing Shoulder Row 1. Sit in a stable chair without armrests, or stand. 2. Secure an exercise band to a stable object in front of you so it is at waist height. 3. Hold one end of the exercise band in each hand. Your palms should be in a thumbs-up position. 4. Bend each of your elbows to an "L" shape (about 90 degrees) and keep your upper arms at your sides. 5. Step back until the band is tight and there is no slack. 6. Slowly pull your elbows back behind you. 7. Hold for __________ seconds. 8. Slowly return to the starting position. Repeat __________ times. Complete this exercise __________ times a day. Exercise K:Shoulder Press-Ups  1. Sit in a stable chair that has armrests. Sit upright, with your feet flat on the floor. 2. Put your hands on the armrests so your elbows are bent and your fingers are pointing forward. Your hands should be about even with the sides of your body. 3. Push down on the armrests and use your arms to lift yourself off of the chair.  Straighten your elbows and lift yourself up as much as you comfortably can. ? Move your shoulder blades down, and avoid letting your shoulders move up toward your ears. ? Keep your feet on the ground. As you get stronger, your feet should support less of your body weight as you lift yourself up. 4. Hold for __________ seconds. 5. Slowly lower yourself back into the chair. Repeat __________ times. Complete this exercise __________ times a day. Exercise L: Wall Push-Ups  1. Stand so you are facing a stable  wall. Your feet should be about one arm-length away from the wall. 2. Lean forward and place your palms on the wall at shoulder height. 3. Keep your feet flat on the floor as you bend your elbows and lean forward toward the wall. 4. Hold for __________ seconds. 5. Straighten your elbows to push yourself back to the starting position. Repeat __________ times. Complete this exercise __________ times a day. This information is not intended to replace advice given to you by your health care provider. Make sure you discuss any questions you have with your health care provider. Document Released: 12/07/2004 Document Revised: 10/18/2015 Document Reviewed: 10/04/2014 Elsevier Interactive Patient Education  2018 [employer]

## 2017-04-18 NOTE — Assessment & Plan Note (Signed)
Patient continued to show improvement in her strength. Left lower extremity strength is almost normal, continue to have weakness of left upper extremity-which also improving according to patient. Has her next neurology appointment on May 17, 2017.  The patient is unable to afford copayment related to her home health physical therapy and wants to continue exercises on her own. I encouraged her to continue exercises, we can refer her to physical therapy if needed in the future again. She will continue aspirin and Lipitor.

## 2017-04-18 NOTE — Progress Notes (Signed)
   CC: Left shoulder pain and numbness.  HPI:  [name] is a 52 y.o. with past medical history as listed below came to the clinic for her one-month follow-up. History was obtained with the help of an interpreter.  She was complaining of left shoulder pain and numbness, she described pain as a dull ache , sometimes feels like a muscle spasm occasionally associated with some tingling, mostly worse in the morning when she get out of bed, denies any stiffness.  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  Patient continued to show improvement in her strength, her leg has almost recovered but continued to have  left weakness and mild left facial weakness. She continued doing her exercises since her discharge from [facility], do get some home PT, but according to patient she cannot afford the copayment of dollar 30 associated with and do not want to continue with that.  She would like exercise herself and does not want an order of outpatient PT at this time.  She has no other complaints today. See assessment and plan for her chronic conditions.   Past Medical History:  Diagnosis Date  . CVA (cerebral vascular accident) (HCC)   . Left wrist fracture    Review of Systems:    Physical Exam:  Vitals:   04/18/17 0915  BP: (!) 133/106  Pulse: 75  Temp: 98.2 F (36.8 C)  TempSrc: Oral  SpO2: 100%  Weight: 160 lb 12.8 oz (72.9 kg)    General: Vital signs reviewed.  Patient is well-developed and well-nourished, in no acute distress and cooperative with exam.  Head: Normocephalic and atraumatic. Eyes: EOMI, conjunctivae normal, no scleral icterus.  Cardiovascular: RRR, S1 normal, S2 normal, no murmurs, gallops, or rubs. Pulmonary/Chest: Clear to auscultation bilaterally, no wheezes, rales, or rhonchi. Abdominal: Soft, non-tender, non-distended, BS +, no masses, organomegaly, or guarding present.  Musculoskeletal: Restricted active range of  motion due to stroke, nonrestricted passive range of motions around left shoulder, mild discomfort on full passive extension of left above head. Extremities: No lower extremity edema bilaterally,  pulses symmetric and intact bilaterally. No cyanosis or clubbing. Neurological: A&O x3, mild left facial weakness, left upper extremity 1-2 /5 and left lower extremity 4+/5, normal strength and sensation on right. Skin: Warm, dry and intact. No rashes or erythema. Psychiatric: Normal mood and affect. speech and behavior is normal. Cognition and memory are normal.  Assessment & Plan:   See Encounters Tab for problem based charting.  Patient discussed with Dr. Josem KaufmannKlima.

## 2017-04-18 NOTE — Congregational Nurse Program (Signed)
Congregational Nurse Program Note  Date of Encounter: 04/18/2017  Past Medical History: Past Medical History:  Diagnosis Date  . CVA (cerebral vascular accident) (HCC)   . Left wrist fracture     Encounter Details:  CN office visit with interpreter Diu Hartshorn assisting.  [employer] intern Lynnell Catalanarson Allred helped patient complete Medicaid application  for herself, her 53 year old son and her husband.  Brantley Flingarolyn O'Brien RN, Congregational Nurse [phone] CNP Questionnaire - 04/18/17 1942      Questionnaire   Patient Status  Refugee    Race  Asian    Location Patient Served At  Not Applicable    Insurance  Not Applicable    Uninsured  Uninsured (NEW 1x/quarter)    Food  No food insecurities    Housing/Utilities  Yes, have permanent housing    Transportation  No transportation needs    Interpersonal Safety  Yes, feel physically and emotionally safe where you currently live    Medication  No medication insecurities    Medical Provider  Yes    Referrals  Medicaid    ED Visit Averted  Not Applicable    Life-Saving Intervention Made  Not Applicable

## 2017-04-18 NOTE — Assessment & Plan Note (Signed)
Shoulder pain appears more like musculoskeletal in the nature. She can have some muscle spasms due to her hemiplegia, she is also at risk of developing adhesive capsulitis because of reduced movements of left upper extremity.  -She will get benefit with continuation of physical therapy for left shoulder to prevent adhesive  Capsulitis, patient do not want physical therapy at this time because of copayment involved with it.  She wants to try exercises herself. -She was provided with shoulder range of motion exercises, we will reevaluate during next follow-up visit if her symptoms continue to get worse or fail to improve, she might consider physical therapy at that time. -She was provided a prescription  of Flexeril, I advised her to take it at bedtime although prescription said that she can take it up to 3 times a day if needed.

## 2017-04-18 NOTE — Assessment & Plan Note (Signed)
Her blood sugar in the clinic was 125, she does not check her blood sugar at home. Last A1c done on March 01, 2017 was 7.5. She is compliant with Metformin 1000 mg daily  Continue Metformin 1000 mg daily, reevaluate during next follow-up visit with PCP.

## 2017-04-19 NOTE — Progress Notes (Signed)
Case discussed with Dr. Amin at the time of the visit.  We reviewed the resident's history and exam and pertinent patient test results.  I agree with the assessment, diagnosis and plan of care documented in the resident's note. 

## 2017-04-20 DIAGNOSIS — K5900 Constipation, unspecified: Secondary | ICD-10-CM

## 2017-04-20 DIAGNOSIS — Z7982 Long term (current) use of aspirin: Secondary | ICD-10-CM

## 2017-04-20 DIAGNOSIS — I10 Essential (primary) hypertension: Secondary | ICD-10-CM

## 2017-04-20 DIAGNOSIS — E119 Type 2 diabetes mellitus without complications: Secondary | ICD-10-CM

## 2017-04-20 DIAGNOSIS — I69398 Other sequelae of cerebral infarction: Secondary | ICD-10-CM

## 2017-04-20 DIAGNOSIS — Z7901 Long term (current) use of anticoagulants: Secondary | ICD-10-CM

## 2017-04-20 DIAGNOSIS — I69392 Facial weakness following cerebral infarction: Secondary | ICD-10-CM

## 2017-04-20 DIAGNOSIS — I69318 Other symptoms and signs involving cognitive functions following cerebral infarction: Secondary | ICD-10-CM

## 2017-04-20 DIAGNOSIS — Z7984 Long term (current) use of oral hypoglycemic drugs: Secondary | ICD-10-CM

## 2017-04-20 DIAGNOSIS — E785 Hyperlipidemia, unspecified: Secondary | ICD-10-CM

## 2017-04-20 DIAGNOSIS — I69354 Hemiplegia and hemiparesis following cerebral infarction affecting left non-dominant side: Secondary | ICD-10-CM | POA: Diagnosis not present

## 2017-04-20 DIAGNOSIS — H532 Diplopia: Secondary | ICD-10-CM

## 2017-05-07 ENCOUNTER — Ambulatory Visit (INDEPENDENT_AMBULATORY_CARE_PROVIDER_SITE_OTHER): Payer: 59 | Admitting: *Deleted

## 2017-05-07 DIAGNOSIS — I639 Cerebral infarction, unspecified: Secondary | ICD-10-CM | POA: Diagnosis not present

## 2017-05-08 NOTE — Progress Notes (Signed)
Carelink Summary Reprot / Loop Recorder 

## 2017-05-09 LAB — CUP PACEART REMOTE DEVICE CHECK
Implantable Pulse Generator Implant Date: 20190123
MDC IDC SESS DTM: 20190227204020

## 2017-05-16 ENCOUNTER — Telehealth: Payer: Self-pay | Admitting: Neurology

## 2017-05-16 NOTE — Telephone Encounter (Signed)
Per Misty StanleyLisa interpreter can be schedule for 0100pm. PT appt change to 0100pm for hospital stroke follow up.

## 2017-05-16 NOTE — Telephone Encounter (Signed)
[facility] called to say that they are having difficulty in finding an interpreter for pt's appointment on tomorrow. She then said they do not have anyone that could translate over the phone either.  The rep asked if the appointment could be rescheduled.  This information was relayed to RN Katrina. The rep from the Interpreter Line stated they are possibly able to get someone here between 1 and 2.  The rep stated she will have to look into and call back.  This information was relayed to RN Katrina as well.  The rep from Interpreter Line was advised that the phone lines will close at 5:00pm

## 2017-05-17 ENCOUNTER — Ambulatory Visit: Payer: Self-pay | Admitting: Neurology

## 2017-05-17 ENCOUNTER — Encounter: Payer: Self-pay | Admitting: Neurology

## 2017-05-17 ENCOUNTER — Ambulatory Visit: Payer: 59 | Admitting: Neurology

## 2017-05-17 VITALS — BP 122/74 | HR 89 | Ht 62.0 in | Wt 157.0 lb

## 2017-05-17 DIAGNOSIS — I6521 Occlusion and stenosis of right carotid artery: Secondary | ICD-10-CM

## 2017-05-17 DIAGNOSIS — I639 Cerebral infarction, unspecified: Secondary | ICD-10-CM | POA: Diagnosis not present

## 2017-05-17 HISTORY — DX: Cerebral infarction, unspecified: I63.9

## 2017-05-17 NOTE — Patient Instructions (Addendum)
I had a long d/w patient and her vietnamese language interpreter about her recent cryptogenic stroke, right carotid occlusionrisk for recurrent stroke/TIAs, personally independently reviewed imaging studies and stroke evaluation results and answered questions.Continue aspirin 325 mg daily  for secondary stroke prevention and maintain strict control of hypertension with blood pressure goal below 130/90, diabetes with hemoglobin A1c goal below 6.5% and lipids with LDL cholesterol goal below 70 mg/dL. I also advised the patient to eat a healthy diet with plenty of whole grains, cereals, fruits and vegetables, exercise regularly and maintain ideal body weight ibuprofen encouraged her to keep her upcoming appointment with Dr Fredderick Erbarolyn  Rogers Guillord and have routine follow-up with her.Followup in the future with my nurse practitioner in 6 months or call earlier if needed.

## 2017-05-17 NOTE — Progress Notes (Signed)
[facility] [address] [place]. [place] [address] [phone]       OFFICE FOLLOW-UP NOTE  Kristen Short. Kristen Short Short Date of Birth:  1965/12/23 Medical Record Number:  [id number]   HPI: Kristen Short Short is a 24 year [place] seen today for the first office follow-up visit following hospital admission for stroke in January 2019. She is accompanied by her [place] language interpreter. History is obtained from them as well as review of electronic medical records. I have personally reviewed imaging films.Marland KitchenHwoc Short is a 52 y.o. female with no known PMH, recent immigrant from [place] who lives in [place] and the rest of the family lives in [place], was brought in for evaluation of left-sided weakness. She had been complaining of a headache that started around Saturday.  She describes it as a very severe pressure-like headache all over her head. Over the next day or so, she started having some difficulty walking.  Yesterday it was noted that around 3 PM she could not walk and had severe weakness in her leg. For the symptoms, she was driven by family to [facility] and brought to [facility] for further evaluation. Upon arrival here in the ER, she had left facial weakness, left arm facility, left leg weakness and right gaze preference. A code stroke was called because initial information was that her last known well was 3 PM yesterday whereas on more detailed history taking, it seems like the symptoms have been ongoing at least since Sunday. No recent illnesses.  No fever chills.  No chest pain palpitations.  No shortness of breath.  No nausea vomiting abdominal pain diarrhea constipation. LKW: 1500 hrs, 02/28/2017 is what was told at first but seems like it was sometime on Sunday, 02/25/2017 tpa given?: no, OSW Premorbid modified Rankin scale (mRS):0.NIHSS 15 on admission.CT angiogram of the head and neck showed right carotid siphon occlusion in the cavernous segment with distal  recanalization. There is moderate bilateral ICA siphon atherosclerotic changes. MRI scan of the brain showed a large right frontal MCA and right posterior watershed territory infarcts with a small subacute right frontal infarct as well. Echocardiogram showed  no definite cardiac source of embolism. Transesophageal echocardiogram was also performed and was unremarkable. Patient had loop recorder inserted and so for paroxysmal atrial fibrillation has not yet been found. She is taking aspirin and tolerating well without bruising or bleeding. She has finished home physical and occupational therapy. She is able to ambulate without assistance. She still has weakness in the left hand and grip and intrinsic hand muscles. She did have some headaches at time of discharge but states that that has improved. She has not yet seen a primary care physician but has an appointment to see Dr. Fredderick Erb on  April 22. She does admit that she gets tired easily. She is able to do things independently but cannot work to the same degrees as she used to before the stroke  ROS:   14 system review of systems is positive for  hand weakness and difficulty walking all other systems negative PMH:  Past Medical History:  Diagnosis Date  . CVA (cerebral vascular accident) (HCC)   . Left wrist fracture     Social History:  Social History   Socioeconomic History  . Marital status: Married    Spouse name: Not on file  . Number of children: Not on file  . Years of education: Not on file  . Highest education level: Not on file  Occupational  History  . Not on file  Social Needs  . Financial resource strain: Not on file  . Food insecurity:    Worry: Not on file    Inability: Not on file  . Transportation needs:    Medical: Not on file    Non-medical: Not on file  Tobacco Use  . Smoking status: Never Smoker  . Smokeless tobacco: Never Used  Substance and Sexual Activity  . Alcohol use: No    Frequency: Never    . Drug use: No  . Sexual activity: Yes  Lifestyle  . Physical activity:    Days per week: Not on file    Minutes per session: Not on file  . Stress: Not on file  Relationships  . Social connections:    Talks on phone: Not on file    Gets together: Not on file    Attends religious service: Not on file    Active member of club or organization: Not on file    Attends meetings of clubs or organizations: Not on file    Relationship status: Not on file  . Intimate partner violence:    Fear of current or ex partner: Not on file    Emotionally abused: Not on file    Physically abused: Not on file    Forced sexual activity: Not on file  Other Topics Concern  . Not on file  Social History Narrative  . Not on file    Medications:   Current Outpatient Medications on File Prior to Visit  Medication Sig Dispense Refill  . acetaminophen (TYLENOL) 500 MG tablet Take 500 mg by mouth every 6 (six) hours as needed for mild pain.    Marland Kitchen. aspirin EC 325 MG tablet Take 1 tablet (325 mg total) by mouth daily. 100 tablet 3  . atorvastatin (LIPITOR) 80 MG tablet Take 1 tablet (80 mg total) by mouth daily at 6 PM. 90 tablet 1   No current facility-administered medications on file prior to visit.     Allergies:  No Known Allergies  Physical Exam General: well developed, well nourished middle-aged Asian lady, seated, in no evident distress Head: head normocephalic and atraumatic.  Neck: supple with no carotid or supraclavicular bruits Cardiovascular: regular rate and rhythm, no murmurs Musculoskeletal: no deformity Skin:  no rash/petichiae Vascular:  Normal pulses all extremities Vitals:   05/17/17 1351  BP: 122/74  Pulse: 89   Neurologic Exam Mental Status: Awake and fully alert. Oriented to place and time. Recent and remote memory intact. Attention span, concentration and fund of knowledge appropriate. Mood and affect appropriate.  Cranial Nerves: Fundoscopic exam reveals sharp disc  margins. Pupils equal, briskly reactive to light. Extraocular movements full without nystagmus. Visual fields full to confrontation. Hearing intact. Facial sensation intact. Mild left lower facial weakness., tongue, palate moves normally and symmetrically.  Motor: mild left hemiparesis mostly in the left upper extremity with left upper extremity drift and 3/5 strength. Weakness of right grip and intrinsic hand muscles. Mild weakness of left hip flexors and ankle dorsiflexors but no drift. Tone is increased on the left compared to the right. Normal strength tone coordination on the right side. Sensory.: intact to touch ,pinprick .position and vibratory sensation.  Coordination: Rapid alternating movements normal in all extremities. Finger-to-nose and heel-to-shin performed accurately bilaterally. Gait and Station: Arises from chair without difficulty. Stance is normal. 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  Reflexes: 1+ and symmetric. Toes downgoing.   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 She's done reasonably well with only mild residual left hemiparesis.    PLAN: I had a long d/w patient and her vietnamese language interpreter about her recent cryptogenic stroke, right carotid occlusionrisk for recurrent stroke/TIAs, personally independently reviewed imaging studies and stroke evaluation results and answered questions.Continue aspirin 325 mg daily  for secondary stroke prevention and maintain strict control of hypertension with blood pressure goal below 130/90, diabetes with hemoglobin A1c goal below 6.5% and lipids with LDL cholesterol goal below 70 mg/dL. I also advised the patient to eat a healthy diet with plenty of whole grains, cereals, fruits and vegetables, exercise regularly and maintain ideal body weight  ibuprofen encouraged her to keep her upcoming appointment with Dr Fredderick Erb and have routine follow-up with her.Followup in the future with my nurse practitioner in 6 months or call earlier if needed. Greater than 50% of time during this 25 minute visit was spent on counseling,explanation of diagnosis of cryptogenic stroke, carotid occlusion, planning of further management, discussion with patient and family and coordination of care Delia Heady, MD  [facility] [address] [place], [place] [address]  Phone [phone] Fax [phone] Note: This document was prepared with digital dictation and possible smart phrase technology. Any transcriptional errors that result from this process are unintentional

## 2017-05-28 ENCOUNTER — Encounter: Payer: 59 | Admitting: Internal Medicine

## 2017-05-28 ENCOUNTER — Other Ambulatory Visit: Payer: Self-pay

## 2017-05-28 ENCOUNTER — Ambulatory Visit (INDEPENDENT_AMBULATORY_CARE_PROVIDER_SITE_OTHER): Payer: 59 | Admitting: Internal Medicine

## 2017-05-28 VITALS — BP 126/91 | HR 79 | Temp 97.8°F | Ht 62.0 in | Wt 155.2 lb

## 2017-05-28 DIAGNOSIS — Z7984 Long term (current) use of oral hypoglycemic drugs: Secondary | ICD-10-CM | POA: Diagnosis not present

## 2017-05-28 DIAGNOSIS — I69354 Hemiplegia and hemiparesis following cerebral infarction affecting left non-dominant side: Secondary | ICD-10-CM

## 2017-05-28 DIAGNOSIS — E119 Type 2 diabetes mellitus without complications: Secondary | ICD-10-CM | POA: Diagnosis not present

## 2017-05-28 DIAGNOSIS — M25512 Pain in left shoulder: Secondary | ICD-10-CM | POA: Diagnosis not present

## 2017-05-28 DIAGNOSIS — Z79899 Other long term (current) drug therapy: Secondary | ICD-10-CM

## 2017-05-28 DIAGNOSIS — Z7982 Long term (current) use of aspirin: Secondary | ICD-10-CM

## 2017-05-28 DIAGNOSIS — Z8673 Personal history of transient ischemic attack (TIA), and cerebral infarction without residual deficits: Secondary | ICD-10-CM

## 2017-05-28 MED ORDER — METFORMIN HCL ER (MOD) 1000 MG PO TB24: 1000.0000 mg | ORAL_TABLET | Freq: Every day | ORAL | 1 refills | Status: DC

## 2017-05-28 NOTE — Patient Instructions (Addendum)
[name] Khi no: 4 - 6 tu?n v?i PCP Dnh cho: qu?n l b?nh ti?u ???ng Mang theo g: thu?c   C Sigmund,  R?t vui ???c g?p b?n hm nay.  ??i v?i b?nh ti?u ???ng c?a b?n, vui lng b?t ??u dng metformin 1000mg  m?i ngy m?t l?n.  Vui lng ti?p t?c dng cc lo?i thu?c khc theo quy ??nh v ti?p t?c cc bi t?p vai t?i nh.  Vui lng quay l?i ?? g?p chng ti sau 4 - 6 tu?n v?i bc s? chnh c?a b?n.     FOLLOW-UP INSTRUCTIONS When: 4-6 weeks with PCP For: diabetes management What to bring: medications   Kristen Short,  It was good to meet you today.  For your diabetes, please start taking metformin 1000mg  once a day.  Please continue to take your other medicines as prescribed and continue your shoulder exercises at home.  Please come back to see us in 4-6 weeks with your primary doctor.

## 2017-05-28 NOTE — Assessment & Plan Note (Signed)
Improved with home PT exercises - Continue conservative management with home PT exercises

## 2017-05-28 NOTE — Assessment & Plan Note (Signed)
Assessment A1c in 02/2017 was 7.5. She was on metformin 1000mg  daily, however she was told she no longer needed to take this, so she stopped taking it.  Plan - Restart metformin 1000mg  daily --- Can uptitrate as needed - Hemoglobin A1c at next visit - F/u in 4-6 weeks with PCP

## 2017-05-28 NOTE — Assessment & Plan Note (Signed)
Assessment Continues to show improvement in LUE and LLE. LLE almost back to normal, and does continue to have decreased grip strength in LUE, however she reports that it is better with home PT exercises. Follows with Dr. Pearlean BrownieSethi in Neurology. Plan to continue risk reduction therapies with aspirin and atorvastatin. Reports compliance with these medications.  Plan - Continue f/u with Neurology - Continue aspirin 325mg  daily and atorvastatin 80mg  daily - Continue home PT exercises

## 2017-05-28 NOTE — Progress Notes (Signed)
   CC: management of diabetes  HPI:  [name] is a 52 y.o. female with PMH Delora Fuelof DM2 and hx of CVA who presents for management of diabetes. History obtained with assistance of interpreter for [place].   Left shoulder pain: She was seen in clinic on 3/13 with left shoulder pain, thought to be secondary to adhesive capsulitis. She wanted to try home PT due to inability to afford physical therapy. She was also given a prescription of flexeril. She reports improvement in her shoulder pain and weakness with the home exercises.  Diabetes: Previously on metformin 1000mg  daily, however she states that someone told her to stop taking it, so she has not been on this.  Please see the assessment and plan below for the status of the patient's chronic medical problems.  Past Medical History:  Diagnosis Date  . CVA (cerebral vascular accident) (HCC)   . Left wrist fracture    Review of Systems:   GEN: Negative for weight loss CV: Negative for chest pain PULM: Negative for shortness of breath MSK: Negative for left shoulder pain. Positive for grip weakness with left hand  Physical Exam:  Vitals:   05/28/17 1553  BP: (!) 126/91  Pulse: 79  Temp: 97.8 F (36.6 C)  TempSrc: Oral  SpO2: 100%  Weight: 155 lb 3.2 oz (70.4 kg)  Height: 5\' 2"  (1.575 m)   GEN: Sitting on exam table in NAD CV: NR & RR, no m/r/g PULM: CTAB, no wheezes or rales MSK: No LE edema. 5/5 grip strength in RUE. 3/5 grip strength in LUE. Normal strength in BLE. Normal sensation in BLE and BUE. 2+ DP and radial pulses bilaterally  Assessment & Plan:   See Encounters Tab for problem based charting.  Patient discussed with Dr. Cleda DaubE. Hoffman

## 2017-05-29 NOTE — Progress Notes (Signed)
Internal Medicine Clinic Attending  Case discussed with Dr. Huang at the time of the visit.  We reviewed the resident's history and exam and pertinent patient test results.  I agree with the assessment, diagnosis, and plan of care documented in the resident's note. 

## 2017-06-11 ENCOUNTER — Ambulatory Visit (INDEPENDENT_AMBULATORY_CARE_PROVIDER_SITE_OTHER): Payer: Self-pay | Admitting: *Deleted

## 2017-06-11 DIAGNOSIS — I639 Cerebral infarction, unspecified: Secondary | ICD-10-CM

## 2017-06-11 LAB — CUP PACEART REMOTE DEVICE CHECK
Date Time Interrogation Session: 20190401213958
Implantable Pulse Generator Implant Date: 20190123

## 2017-06-11 NOTE — Progress Notes (Signed)
Carelink Summary Report / Loop Recorder 

## 2017-07-03 LAB — CUP PACEART REMOTE DEVICE CHECK
Date Time Interrogation Session: 20190504220955
Implantable Pulse Generator Implant Date: 20190123

## 2017-07-12 ENCOUNTER — Ambulatory Visit (INDEPENDENT_AMBULATORY_CARE_PROVIDER_SITE_OTHER): Payer: Self-pay | Admitting: *Deleted

## 2017-07-12 DIAGNOSIS — I639 Cerebral infarction, unspecified: Secondary | ICD-10-CM

## 2017-07-13 NOTE — Progress Notes (Signed)
Carelink Summary Report / Loop Recorder 

## 2017-08-08 NOTE — Congregational Nurse Program (Signed)
Congregational Nurse Program Note  Date of Encounter: 08/08/2017  Past Medical History: Past Medical History:  Diagnosis Date  . CVA (cerebral vascular accident) (HCC)   . Left wrist fracture     Encounter Details:  CN office visit with husband and interpreter Diu Hartshorn assisting.  Patient concerned about medical bills and no longer able to meet payment plan agreements.  She is not eligible for Medicaid because she has not been is [place] 5 years.  Requested [facility] financial assistance due to large balance.  Application will be mailed to her and she will return to CN office as soon as she receives it for help completing paperwork.  Brantley Flingarolyn O'Brien RN, Congregational Nurse [phone] CNP Questionnaire - 08/08/17 2123      Questionnaire   Patient Status  Refugee    Race  Asian    Location Patient Served At  Not Applicable    Insurance  Not Applicable    Uninsured  Uninsured (NEW 1x/quarter)    Food  No food insecurities    Housing/Utilities  Yes, have permanent housing    Transportation  No transportation needs    Interpersonal Safety  Yes, feel physically and emotionally safe where you currently live    Medication  No medication insecurities    Medical Provider  Yes    [facility]    ED Visit Averted  Not Applicable    Life-Saving Intervention Made  Not Applicable

## 2017-08-14 ENCOUNTER — Ambulatory Visit (INDEPENDENT_AMBULATORY_CARE_PROVIDER_SITE_OTHER): Payer: Self-pay | Admitting: *Deleted

## 2017-08-14 DIAGNOSIS — I639 Cerebral infarction, unspecified: Secondary | ICD-10-CM

## 2017-08-15 NOTE — Progress Notes (Signed)
Carelink Summary Report / Loop Recorder 

## 2017-08-20 ENCOUNTER — Encounter: Payer: Self-pay | Admitting: Internal Medicine

## 2017-08-20 ENCOUNTER — Ambulatory Visit (INDEPENDENT_AMBULATORY_CARE_PROVIDER_SITE_OTHER): Payer: Self-pay | Admitting: Internal Medicine

## 2017-08-20 ENCOUNTER — Other Ambulatory Visit: Payer: Self-pay

## 2017-08-20 VITALS — BP 124/59 | HR 75 | Temp 97.5°F | Wt 156.2 lb

## 2017-08-20 DIAGNOSIS — I69354 Hemiplegia and hemiparesis following cerebral infarction affecting left non-dominant side: Secondary | ICD-10-CM

## 2017-08-20 DIAGNOSIS — Z79899 Other long term (current) drug therapy: Secondary | ICD-10-CM

## 2017-08-20 DIAGNOSIS — Z7984 Long term (current) use of oral hypoglycemic drugs: Secondary | ICD-10-CM

## 2017-08-20 DIAGNOSIS — Z7982 Long term (current) use of aspirin: Secondary | ICD-10-CM

## 2017-08-20 DIAGNOSIS — E119 Type 2 diabetes mellitus without complications: Secondary | ICD-10-CM

## 2017-08-20 DIAGNOSIS — Z8673 Personal history of transient ischemic attack (TIA), and cerebral infarction without residual deficits: Secondary | ICD-10-CM

## 2017-08-20 LAB — CUP PACEART REMOTE DEVICE CHECK
Date Time Interrogation Session: 20190606223928
MDC IDC PG IMPLANT DT: 20190123

## 2017-08-20 MED ORDER — METFORMIN HCL ER 500 MG PO TB24: 1000.0000 mg | ORAL_TABLET | Freq: Every day | ORAL | 2 refills | Status: DC

## 2017-08-20 MED ORDER — ATORVASTATIN CALCIUM 80 MG PO TABS: 80.0000 mg | ORAL_TABLET | Freq: Every day | ORAL | 1 refills | Status: DC

## 2017-08-20 MED FILL — METFORMIN HCL ER 500 MG TAB: 500 | 30 days supply | Qty: 60 | Fill #0

## 2017-08-20 MED FILL — ATORVASTATIN 80 MG TABLET: 80 | 30 days supply | Qty: 30 | Fill #0

## 2017-08-20 NOTE — Assessment & Plan Note (Addendum)
Assessment:  Pt with significant improvement in LLE strength and moderate improvement in RUE strength and ROM. Performs at-home PT exercises twice daily. Review of medication bottles show appropriate amount of Atorvastatin and ASA.      Plan: Still unable to afford co-pay for formal PT. Pt to continue at home PT and let us know if she would like referral. Pt to continue Atorvastatin 80mg , ASA daily and Metformin. BP well controlled, no indication for anti-hypertensive today.  -Pt has appt with [facility] in August for orange card.

## 2017-08-20 NOTE — Progress Notes (Signed)
Internal Medicine Clinic Attending  Case discussed with Dr. Molt at the time of the visit.  We reviewed the resident's history and exam and pertinent patient test results.  I agree with the assessment, diagnosis, and plan of care documented in the resident's note. 

## 2017-08-20 NOTE — Patient Instructions (Signed)
It was nice meeting you today. I'm glad youre doing well. Your blood pressure was excellent.   Today we talked about 1) Left arm pain: Please continue the good work with your home physical therapy exercises. Please be sure to do these 2 to 3 times a day. You can take over the counter Ibuprofen or Tylenol as needed.  2) Diabetes: I[facility]. Please take 2 tablets every morning.  3) I've also [facility].   Please return to clinic in 143-months to follow-up diabetes and your left arm pain.

## 2017-08-20 NOTE — Assessment & Plan Note (Addendum)
Assessment:  Last A1c 7.5%, Metformin started at that time. Reviewed pts medication bottles today; no Metformin seen. Reports running out of this and not knowing she had a refill at the pharmacy. Discussed importance of glycemic control as it relates to overall health, particularly in setting of prior CVA.      Plan: No A1c today as pt off her medication. Will refill Metformin today. Sent Rx for Metformin XR 1000mg  [facility]. Pt to RTC 3 months for follow-up.

## 2017-08-20 NOTE — Progress Notes (Signed)
   CC: follow-up of CVA, DM2  HPI:  [name] is a 52 y.o. F with history of CVA 01/2017 with residual L-sided weakness (LUE > LLE) and type 2 diabetes who presents for routine follow-up of CVA. She also ran out of Metformin. Visit was completed with the assistance of a translator.   For details regarding today's visit and the status of their chronic medical issues, please refer to the assessment and plan.  Past Medical History:  Diagnosis Date  . CVA (cerebral vascular accident) (HCC)   . Left wrist fracture    Review of Systems:   General: Denies fevers, chills, weight loss HEENT: Denies acute changes in vision, dysphagia or headache Cardiac: Denies CP, SOB Pulmonary: Denies cough Abd: Denies abdominal pain, changes in bowels Extremities: Admits to improved LLE weakness and improved LUE weakness. Denies swelling  Physical Exam: General: Alert, in no acute distress. Pleasant and conversant. Husband and translator present.  HEENT: No icterus, injection or ptosis. No hoarseness or dysarthria  Cardiac: RRR, no MGR Pulmonary: CTA BL with normal WOB on RA. Able to speak in complete sentences Abd: Soft, non-tender. +bs Extremities: Warm, perfused. No significant pedal edema. Strength 4/5 LLE and 3/5 LUE.   Vitals:   08/20/17 0949 08/20/17 1011  BP: (!) 136/58 (!) 124/59  Pulse: 66 75  Temp: (!) 97.5 F (36.4 C)   TempSrc: Oral   SpO2: 100% 100%  Weight: 156 lb 3.2 oz (70.9 kg)    Body mass index is 28.57 kg/m.  Assessment & Plan:   See Encounters Tab for problem based charting.  Patient discussed with Dr. Heide SparkNarendra

## 2017-08-22 NOTE — Congregational Nurse Program (Signed)
Congregational Nurse Program Note  Date of Encounter: 08/22/2017  Past Medical History: Past Medical History:  Diagnosis Date  . CVA (cerebral vascular accident) (HCC)   . Left wrist fracture     Encounter Details: CN office visit with interpreter Diu Hartshorn assisting.  Patient states she never received [facility] financial assistance application.  Phone call to billing office.  They will send another form.  We helped her complete new patient packet for TAPM for her son. She will drop off information and schedule appointment for him.  We also reviewed discharge summary from her last PCP visit which included a discussion of her current medications. States she is taking medicine as prescribed. Brantley Flingarolyn O'Brien RN, Congregational Nurse [phone] CNP Questionnaire - 08/22/17 2103      Questionnaire   Patient Status  Refugee    Race  Asian    Location Patient Served At  Not Applicable    Insurance  Not Applicable    Uninsured  Uninsured (NEW 1x/quarter)    Food  No food insecurities    Housing/Utilities  Yes, have permanent housing    Transportation  No transportation needs    Interpersonal Safety  Yes, feel physically and emotionally safe where you currently live    Medication  No medication insecurities    Medical Provider  Yes    Referrals  Not Applicable    ED Visit Averted  Not Applicable    Life-Saving Intervention Made  Not Applicable      Clinical Intake - 08/20/17 1012      Pain   Pain Descriptors / Indicators  Throbbing    Pain Frequency  Intermittent    Pain Relieving Factors  moving arm    Effect of Pain on Daily Activities  difficult to hold items with left hand      Nutrition Screen   Diabetes  Yes    CBG done?  No    Did pt. bring in CBG monitor from home?  No      Patient Literacy   How often do you need to have someone help you when you read instructions, pamphlets, or other written materials from your doctor or pharmacy?  5 - Always    What is the last grade  level you completed in school?  5th grade

## 2017-08-29 NOTE — Congregational Nurse Program (Signed)
Congregational Nurse Program Note  Date of Encounter: 08/29/2017  Past Medical History: Past Medical History:  Diagnosis Date  . CVA (cerebral vascular accident) (HCC)   . Left wrist fracture     Encounter Details:  CN office visit with interpreter Diu Hartshorn assisting.  Assisted patient with completing [facility] Financial assistance application.  Also reviewed medication directions.  Brantley Flingarolyn O'Brien RN, Congregational Nurse [phone] CNP Questionnaire - 08/29/17 2117      Questionnaire   Patient Status  Refugee    Race  Asian    Location Patient Served At  Not Applicable    Insurance  Not Applicable    Uninsured  Uninsured (NEW 1x/quarter)    Food  No food insecurities    Housing/Utilities  Yes, have permanent housing    Transportation  No transportation needs    Interpersonal Safety  Yes, feel physically and emotionally safe where you currently live    Medication  No medication insecurities    Medical Provider  Yes    Referrals  Not Applicable    ED Visit Averted  Not Applicable    Life-Saving Intervention Made  Not Applicable      Clinical Intake - 08/20/17 1012      Pain   Pain Descriptors / Indicators  Throbbing    Pain Frequency  Intermittent    Pain Relieving Factors  moving arm    Effect of Pain on Daily Activities  difficult to hold items with left hand      Nutrition Screen   Diabetes  Yes    CBG done?  No    Did pt. bring in CBG monitor from home?  No      Patient Literacy   How often do you need to have someone help you when you read instructions, pamphlets, or other written materials from your doctor or pharmacy?  5 - Always    What is the last grade level you completed in school?  5th grade

## 2017-09-17 ENCOUNTER — Ambulatory Visit (INDEPENDENT_AMBULATORY_CARE_PROVIDER_SITE_OTHER): Payer: Self-pay | Admitting: *Deleted

## 2017-09-17 DIAGNOSIS — I639 Cerebral infarction, unspecified: Secondary | ICD-10-CM

## 2017-09-17 NOTE — Progress Notes (Signed)
Carelink Summary Report / Loop Recorder 

## 2017-09-20 LAB — CUP PACEART REMOTE DEVICE CHECK
Date Time Interrogation Session: 20190709233920
Implantable Pulse Generator Implant Date: 20190123

## 2017-09-25 ENCOUNTER — Ambulatory Visit: Payer: Self-pay

## 2017-09-28 ENCOUNTER — Other Ambulatory Visit: Payer: Self-pay | Admitting: Internal Medicine

## 2017-10-10 NOTE — Congregational Nurse Program (Signed)
CN office visit with interpreter Diu Hartshorn assisting.  We helped her complete information for orange card application and explained how to obtain remaining information needed to complete process.  Wrote notes for husband to take to his job and to the bank to get remaining pay records and bank statements.  He cannot locate the registration card or tax bill for his car.  Advised him he needs to go to [facility] and that he needs to keep registration in his glove compartment. We will call for appointment to complete orange card application as soon as all information is obtained.  Brantley Fling RN, Congregational Nurse [phone]

## 2017-10-15 ENCOUNTER — Telehealth: Payer: Self-pay

## 2017-10-15 NOTE — Telephone Encounter (Signed)
atorvastatin (LIPITOR) 80 MG tablet,  aspirin EC 325 MG tablet, refill request @   [facility] [place], [place] - [address]. [phone] (Phone) [phone] (Fax)

## 2017-10-15 NOTE — Telephone Encounter (Signed)
Patient has refills at [facility] for atorvastatin. Spoke with Shanda Bumps and she will get this and metformin ready for patient. Has not picked up either med since July 2019. She will instruct patient to purchase ASA OTC as it is only $1. Kinnie Feil, RN, BSN

## 2017-10-19 ENCOUNTER — Ambulatory Visit (INDEPENDENT_AMBULATORY_CARE_PROVIDER_SITE_OTHER): Payer: Self-pay | Admitting: *Deleted

## 2017-10-19 DIAGNOSIS — I639 Cerebral infarction, unspecified: Secondary | ICD-10-CM

## 2017-10-20 ENCOUNTER — Other Ambulatory Visit: Payer: Self-pay | Admitting: Internal Medicine

## 2017-10-20 NOTE — Progress Notes (Signed)
Carelink Summary Report / Loop Recorder 

## 2017-10-24 LAB — CUP PACEART REMOTE DEVICE CHECK
Date Time Interrogation Session: 20190811233616
MDC IDC PG IMPLANT DT: 20190123

## 2017-11-03 LAB — CUP PACEART REMOTE DEVICE CHECK
Implantable Pulse Generator Implant Date: 20190123
MDC IDC SESS DTM: 20190914001137

## 2017-11-07 ENCOUNTER — Other Ambulatory Visit: Payer: Self-pay | Admitting: Internal Medicine

## 2017-11-07 NOTE — Telephone Encounter (Signed)
Rx was sent to [facility].

## 2017-11-09 ENCOUNTER — Other Ambulatory Visit: Payer: Self-pay | Admitting: Internal Medicine

## 2017-11-19 ENCOUNTER — Ambulatory Visit: Payer: 59 | Admitting: Adult Health

## 2017-11-19 ENCOUNTER — Encounter: Payer: Self-pay | Admitting: Adult Health

## 2017-11-20 ENCOUNTER — Telehealth: Payer: Self-pay

## 2017-11-20 NOTE — Telephone Encounter (Signed)
Pt no show for appt on 11/19/2017. 

## 2017-11-21 ENCOUNTER — Ambulatory Visit (INDEPENDENT_AMBULATORY_CARE_PROVIDER_SITE_OTHER): Payer: Self-pay | Admitting: *Deleted

## 2017-11-21 DIAGNOSIS — I639 Cerebral infarction, unspecified: Secondary | ICD-10-CM

## 2017-11-21 NOTE — Progress Notes (Deleted)
[facility] [address] [place]. [place] [address] [phone]       OFFICE FOLLOW-UP NOTE  Kristen Short Date of Birth:  07-19-1965 Medical Record Number:  [id number]   HPI (initial visit 05/17/2017 PS): Ms Kristen Short is a 51 year [place] seen today for the first office follow-up visit following hospital admission for stroke in January 2019. She is accompanied by her [place] language interpreter. History is obtained from them as well as review of electronic medical records. I have personally reviewed imaging films.Kristen Short is a 52 y.o. female with no known PMH, recent immigrant from [place] who lives in [place] and the rest of the family lives in [place], was brought in for evaluation of left-sided weakness. She had been complaining of a headache that started around Saturday.  She describes it as a very severe pressure-like headache all over her head. Over the next day or so, she started having some difficulty walking.  Yesterday it was noted that around 3 PM she could not walk and had severe weakness in her leg. For the symptoms, she was driven by family to [facility] and brought to [facility] for further evaluation. Upon arrival here in the ER, she had left facial weakness, left arm facility, left leg weakness and right gaze preference. A code stroke was called because initial information was that her last known well was 3 PM yesterday whereas on more detailed history taking, it seems like the symptoms have been ongoing at least since Sunday. No recent illnesses.  No fever chills.  No chest pain palpitations.  No shortness of breath.  No nausea vomiting abdominal pain diarrhea constipation. LKW: 1500 hrs, 02/28/2017 is what was told at first but seems like it was sometime on Sunday, 02/25/2017 tpa given?: no, OSW Premorbid modified Rankin scale (mRS):0.NIHSS 15 on admission.CT angiogram of the head and neck showed right carotid siphon occlusion in the  cavernous segment with distal recanalization. There is moderate bilateral ICA siphon atherosclerotic changes. MRI scan of the brain showed a large right frontal MCA and right posterior watershed territory infarcts with a small subacute right frontal infarct as well. Echocardiogram showed  no definite cardiac source of embolism. Transesophageal echocardiogram was also performed and was unremarkable. Patient had loop recorder inserted and so for paroxysmal atrial fibrillation has not yet been found. She is taking aspirin and tolerating well without bruising or bleeding. She has finished home physical and occupational therapy. She is able to ambulate without assistance. She still has weakness in the left hand and grip and intrinsic hand muscles. She did have some headaches at time of discharge but states that that has improved. She has not yet seen a primary care physician but has an appointment to see Dr. Fredderick Erb on  April 22. She does admit that she gets tired easily. She is able to do things independently but cannot work to the same degrees as she used to before the stroke  Interval history 11/22/2017:    ROS:   14 system review of systems is positive for  hand weakness and difficulty walking all other systems negative PMH:  Past Medical History:  Diagnosis Date  . CVA (cerebral vascular accident) (HCC)   . Left wrist fracture     Social History:  Social History   Socioeconomic History  . Marital status: Married    Spouse name: Not on file  . Number of children: Not on file  . Years of education: Not on file  .  Highest education level: Not on file  Occupational History  . Not on file  Social Needs  . Financial resource strain: Not on file  . Food insecurity:    Worry: Not on file    Inability: Not on file  . Transportation needs:    Medical: Not on file    Non-medical: Not on file  Tobacco Use  . Smoking status: Never Smoker  . Smokeless tobacco: Never Used    Substance and Sexual Activity  . Alcohol use: No    Frequency: Never  . Drug use: No  . Sexual activity: Yes  Lifestyle  . Physical activity:    Days per week: Not on file    Minutes per session: Not on file  . Stress: Not on file  Relationships  . Social connections:    Talks on phone: Not on file    Gets together: Not on file    Attends religious service: Not on file    Active member of club or organization: Not on file    Attends meetings of clubs or organizations: Not on file    Relationship status: Not on file  . Intimate partner violence:    Fear of current or ex partner: Not on file    Emotionally abused: Not on file    Physically abused: Not on file    Forced sexual activity: Not on file  Other Topics Concern  . Not on file  Social History Narrative  . Not on file    Medications:   Current Outpatient Medications on File Prior to Visit  Medication Sig Dispense Refill  . acetaminophen (TYLENOL) 500 MG tablet Take 500 mg by mouth every 6 (six) hours as needed for mild pain.    Kristen Kitchen aspirin EC 325 MG tablet Take 1 tablet (325 mg total) by mouth daily. 100 tablet 3  . atorvastatin (LIPITOR) 80 MG tablet Take 1 tablet (80 mg total) by mouth daily. 90 tablet 1  . atorvastatin (LIPITOR) 80 MG tablet Take 1 tablet (80 mg total) by mouth daily at 6 PM. 90 tablet 0  . metFORMIN (GLUCOPHAGE-XR) 500 MG 24 hr tablet Take 2 tablets (1,000 mg total) by mouth daily with breakfast. 180 tablet 2   No current facility-administered medications on file prior to visit.     Allergies:  No Known Allergies  Physical Exam General: well developed, well nourished middle-aged Asian lady, seated, in no evident distress Head: head normocephalic and atraumatic.  Neck: supple with no carotid or supraclavicular bruits Cardiovascular: regular rate and rhythm, no murmurs Musculoskeletal: no deformity Skin:  no rash/petichiae Vascular:  Normal pulses all extremities There were no vitals filed for  this visit. Neurologic Exam Mental Status: Awake and fully alert. Oriented to place and time. Recent and remote memory intact. Attention span, concentration and fund of knowledge appropriate. Mood and affect appropriate.  Cranial Nerves: Fundoscopic exam reveals sharp disc margins. Pupils equal, briskly reactive to light. Extraocular movements full without nystagmus. Visual fields full to confrontation. Hearing intact. Facial sensation intact. Mild left lower facial weakness., tongue, palate moves normally and symmetrically.  Motor: mild left hemiparesis mostly in the left upper extremity with left upper extremity drift and 3/5 strength. Weakness of right grip and intrinsic hand muscles. Mild weakness of left hip flexors and ankle dorsiflexors but no drift. Tone is increased on the left compared to the right. Normal strength tone coordination on the right side. Sensory.: intact to touch ,pinprick .position and vibratory sensation.  Coordination:  Rapid alternating movements normal in all extremities. Finger-to-nose and heel-to-shin performed accurately bilaterally. Gait and Station: Arises from chair without difficulty. Stance is normal. 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  Reflexes: 1+ and symmetric. Toes downgoing.   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 She's done reasonably well with only mild residual left hemiparesis.    PLAN: -Continue aspirin 325 mg daily  and ***  for secondary stroke prevention -F/u with PCP regarding your *** management -continue to monitor BP at home  -Maintain strict control of hypertension with blood pressure goal below 130/90, diabetes with hemoglobin A1c goal below 6.5% and cholesterol with LDL cholesterol (bad cholesterol) goal below 70 mg/dL. I also advised the patient to eat a  healthy diet with plenty of whole grains, cereals, fruits and vegetables, exercise regularly and maintain ideal body weight.  Follow up in *** or call earlier if needed   Greater than 50% of time during this 25 minute visit was spent on counseling,explanation of diagnosis of cryptogenic stroke, carotid occlusion, planning of further management, discussion with patient and family and coordination of care  George Hugh, [facility]  [facility] [address] [place], [place] [address]  Phone [phone] Fax [phone] Note: This document was prepared with digital dictation and possible smart phrase technology. Any transcriptional errors that result from this process are unintentional.

## 2017-11-22 ENCOUNTER — Encounter: Payer: Self-pay | Admitting: Adult Health

## 2017-11-22 ENCOUNTER — Telehealth: Payer: Self-pay

## 2017-11-22 ENCOUNTER — Ambulatory Visit: Payer: Self-pay | Admitting: Adult Health

## 2017-11-22 NOTE — Telephone Encounter (Signed)
The interpreter for patient did show for appointment today. The patient never showed up at all but reschedule the appt.

## 2017-11-22 NOTE — Progress Notes (Signed)
Carelink Summary Report / Loop Recorder 

## 2017-12-03 LAB — CUP PACEART REMOTE DEVICE CHECK
Date Time Interrogation Session: 20191017004051
MDC IDC PG IMPLANT DT: 20190123

## 2017-12-12 NOTE — Congregational Nurse Program (Signed)
CN office visit with interpreter Diu Hartshorn.   Scheduled appointment for patient at [facility] for 12/14/2017 at 9:00 am.  [employer] intern assisted her with medical bills. Brantley Fling RN, Congregational Nurse [phone]

## 2017-12-14 ENCOUNTER — Encounter: Payer: Self-pay | Admitting: Adult Health

## 2017-12-14 ENCOUNTER — Ambulatory Visit: Payer: Self-pay | Admitting: Adult Health

## 2017-12-14 VITALS — BP 136/74 | HR 76 | Ht 62.0 in | Wt 157.4 lb

## 2017-12-14 DIAGNOSIS — E119 Type 2 diabetes mellitus without complications: Secondary | ICD-10-CM

## 2017-12-14 DIAGNOSIS — I639 Cerebral infarction, unspecified: Secondary | ICD-10-CM

## 2017-12-14 DIAGNOSIS — E785 Hyperlipidemia, unspecified: Secondary | ICD-10-CM

## 2017-12-14 MED ORDER — ATORVASTATIN CALCIUM 80 MG PO TABS
80.0000 mg | ORAL_TABLET | Freq: Every day | ORAL | 0 refills | Status: DC
Start: 2017-12-14 — End: 2018-03-05
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
## 2017-12-14 NOTE — Patient Instructions (Addendum)
Continue aspirin 325 mg daily  and lipitor 80mg   for secondary stroke prevention  Continue to follow up with PCP regarding cholesterol, blood pressure and diabetes management - please ensure you schedule follow up with your primary doctor to help with stroke risk factor management   Continue to monitor loop recorder to look for possible atrial fibrillation as cause of stroke  Continue to monitor blood pressure at home  Maintain strict control of hypertension with blood pressure goal below 130/90, diabetes with hemoglobin A1c goal below 6.5% and cholesterol with LDL cholesterol (bad cholesterol) goal below 70 mg/dL. I also advised the patient to eat a healthy diet with plenty of whole grains, cereals, fruits and vegetables, exercise regularly and maintain ideal body weight.  Followup in the future with me in 6 months or call earlier if needed       Thank you for coming to see [place] at [facility]. I hope we have been able to provide you high quality care today.  You may receive a patient satisfaction survey over the next few weeks. We would appreciate your feedback and comments so that we may continue to improve ourselves and the health of our patients.

## 2017-12-22 NOTE — Progress Notes (Signed)
I agree with the above plan 

## 2017-12-24 ENCOUNTER — Ambulatory Visit (INDEPENDENT_AMBULATORY_CARE_PROVIDER_SITE_OTHER): Payer: Self-pay

## 2017-12-24 DIAGNOSIS — I639 Cerebral infarction, unspecified: Secondary | ICD-10-CM

## 2017-12-25 NOTE — Progress Notes (Signed)
Carelink Summary Report / Loop Recorder 

## 2018-01-10 NOTE — Progress Notes (Deleted)
   CC: ***  HPI:  Ms.Shyann Delora Fuelyun is a 52 y.o. with prior mca cryptogenic stroke in January 2019, type 2 diabetes, hyperlipidemia who presents for diabetes follow up. Please see problem based charting for evaluation, assessment, and plan.  Diabetes The patient's last a1c=7.5 in January 2019. The patient's home  blood glucose measurements over the past month have ranged ***. The patient does/does not note episodes of hypoglycemia.   The patient is currently taking ***. The patient is compliant with medication.    Patient's weight changes ***  Assessment and plan    Cryptogenic stroke  Patient had a right mca stroke in December 2018 with mild residual left sided hemiparesis.  He was last seen by neurology in November 2018 felt that the patient was continue to improve.  The patient does not report any new or worsening stroke.  Assessment and plan   -Continue aspirin 325 mg -Continue atorvastatin -Continue loop recorder to determine if any atrial fibrillation -Strict hypertension control with BP goal <130/90 -A1c goal <6.5% -LDL goal <70   Past Medical History:  Diagnosis Date  . CVA (cerebral vascular accident) (HCC)   . Left wrist fracture    Review of Systems:  ***  Physical Exam:  There were no vitals filed for this visit. ***  Assessment & Plan:   See Encounters Tab for problem based charting.  Patient {GC/GE:3044014::"discussed with","seen with"} Dr. {NAMES:[id number]::"Butcher","Granfortuna","E. Hoffman","Klima","Mullen","Narendra","Raines","Vincent"}

## 2018-01-11 ENCOUNTER — Encounter: Payer: Self-pay | Admitting: Internal Medicine

## 2018-01-27 LAB — CUP PACEART REMOTE DEVICE CHECK
MDC IDC PG IMPLANT DT: 20190123
MDC IDC SESS DTM: 20191222013559

## 2018-01-28 ENCOUNTER — Ambulatory Visit (INDEPENDENT_AMBULATORY_CARE_PROVIDER_SITE_OTHER): Payer: Self-pay

## 2018-01-28 ENCOUNTER — Other Ambulatory Visit: Payer: Self-pay | Admitting: Internal Medicine

## 2018-01-28 DIAGNOSIS — I639 Cerebral infarction, unspecified: Secondary | ICD-10-CM

## 2018-01-28 NOTE — Progress Notes (Signed)
Carelink Summary Report / Loop Recorder 

## 2018-02-10 LAB — CUP PACEART REMOTE DEVICE CHECK
Date Time Interrogation Session: 20191119011004
Implantable Pulse Generator Implant Date: 20190123

## 2018-02-28 ENCOUNTER — Ambulatory Visit (INDEPENDENT_AMBULATORY_CARE_PROVIDER_SITE_OTHER): Payer: Self-pay

## 2018-02-28 DIAGNOSIS — I639 Cerebral infarction, unspecified: Secondary | ICD-10-CM

## 2018-03-01 NOTE — Progress Notes (Signed)
Carelink Summary Report / Loop Recorder 

## 2018-03-03 ENCOUNTER — Other Ambulatory Visit: Payer: Self-pay | Admitting: Internal Medicine

## 2018-03-03 LAB — CUP PACEART REMOTE DEVICE CHECK
Date Time Interrogation Session: 20200124013644
Implantable Pulse Generator Implant Date: 20190123

## 2018-03-06 NOTE — Congregational Nurse Program (Signed)
CN office visit with her husband and interpreter Diu Hartshorn.  Patient states she has been out of atorvastatin and went to [facility] times and did not understand why medicine was not available.  Patient missed her last appointment in December.  Phone call to [facility] internal Medicine clinic .  Her doctor sent in a new prescription yesterday.  She has a follow-up appointment now scheduled for April 26, 2018 at 2:15 pm. Recommended she make an appointment with the financial counselor at her next visit so there is not a lapse in her eligibility.    [employer] intern helped her with food stamp questions.  Brantley Fling RN, Congregational Nurse [phone]

## 2018-04-02 ENCOUNTER — Ambulatory Visit (INDEPENDENT_AMBULATORY_CARE_PROVIDER_SITE_OTHER): Payer: Self-pay | Admitting: *Deleted

## 2018-04-02 DIAGNOSIS — I639 Cerebral infarction, unspecified: Secondary | ICD-10-CM

## 2018-04-03 LAB — CUP PACEART REMOTE DEVICE CHECK
Date Time Interrogation Session: 20200226014025
Implantable Pulse Generator Implant Date: 20190123

## 2018-04-06 IMAGING — CT CT HEAD CODE STROKE
4 series · 15 of 47 positions shown, 17 images · non-contrast
Comparison: None.

CLINICAL DATA: Code stroke. 51-year-old female with left side
paralysis. Unknown time of onset.

EXAM:
CT HEAD WITHOUT CONTRAST
TECHNIQUE: Contiguous axial images were obtained from the base of the skull
through the vertex without intravenous contrast.

[Series 3: head wo · axial · 0.44mm/px · z∈[-92,+28]mm · 7 of 32 slices shown, 9 images]
[im 4/32  brain]
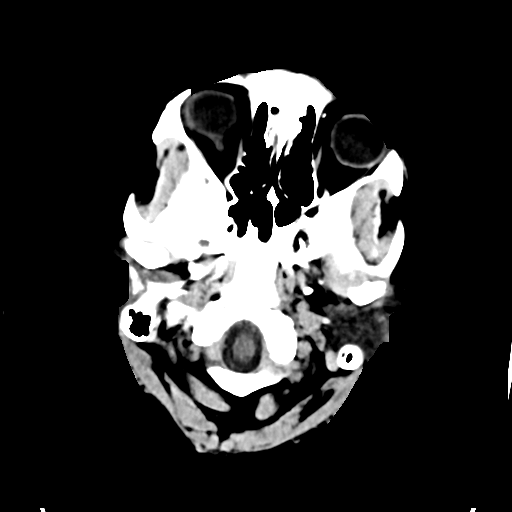
[im 4/32  bone]
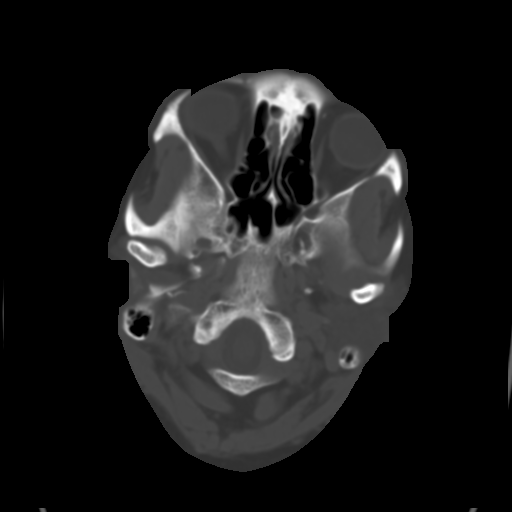
[im 8/32  brain]
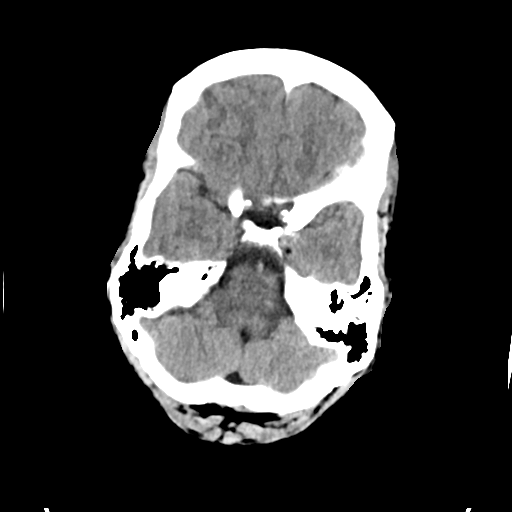
[im 12/32  brain]
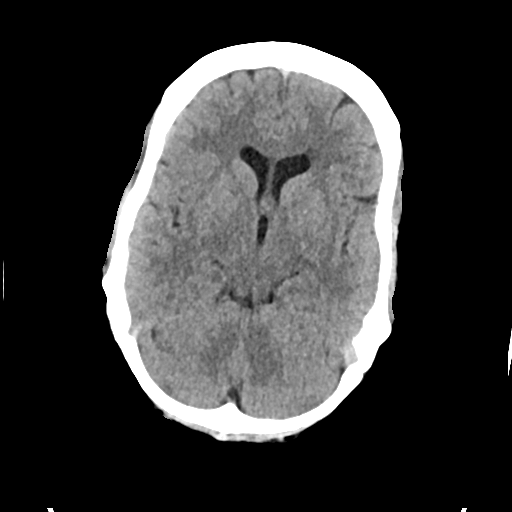
[im 16/32  brain]
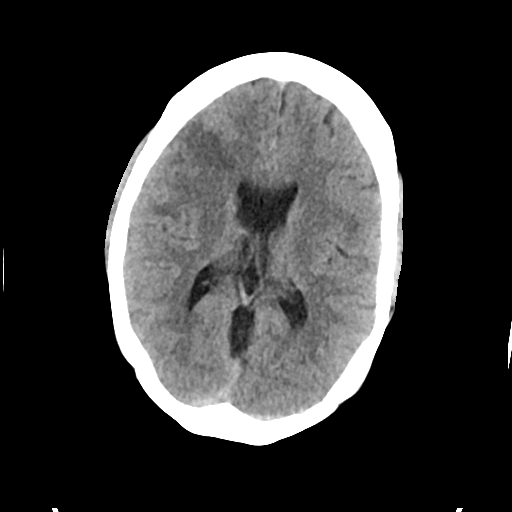
[im 20/32  brain]
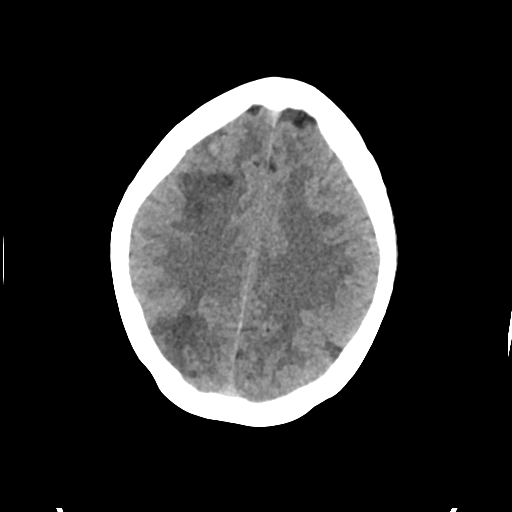
[im 20/32  bone]
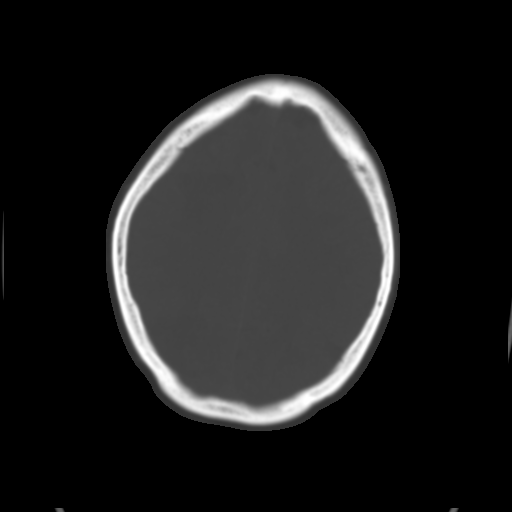
[im 24/32  brain]
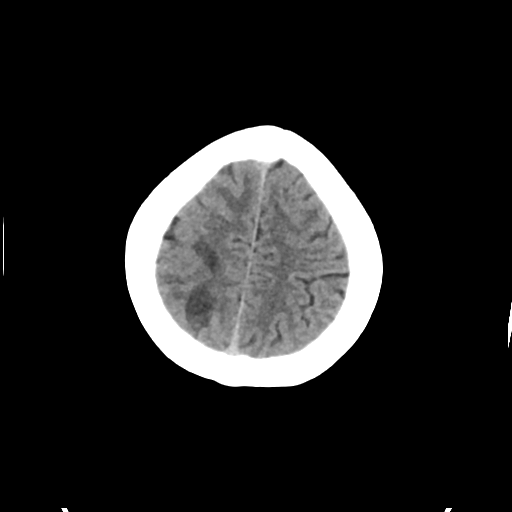
[im 28/32  brain]
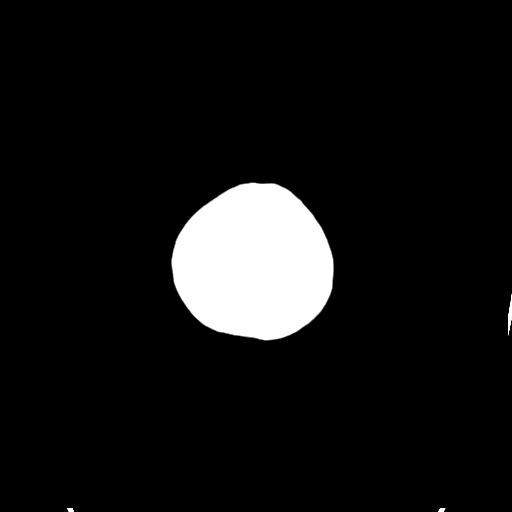

[Series 4: head bone · axial · 0.44mm/px · z∈[-94,-78]mm · 2 of 79 slices shown]
[im 8/79  bone]
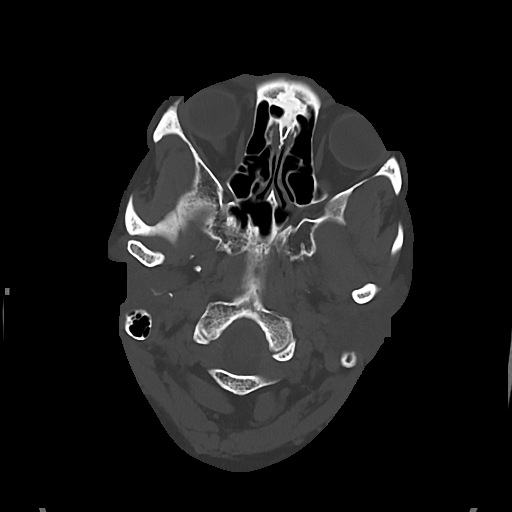
[im 16/79  bone]
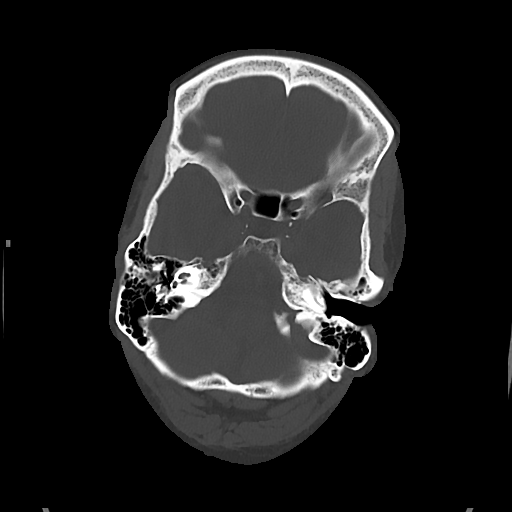

[Series 5: cor soft · coronal · 0.31mm/px · 3 of 65 slices shown]
[im 22/65  brain]
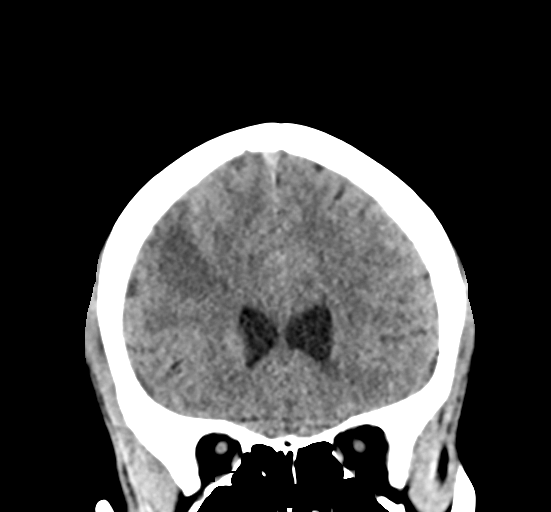
[im 29/65  brain]
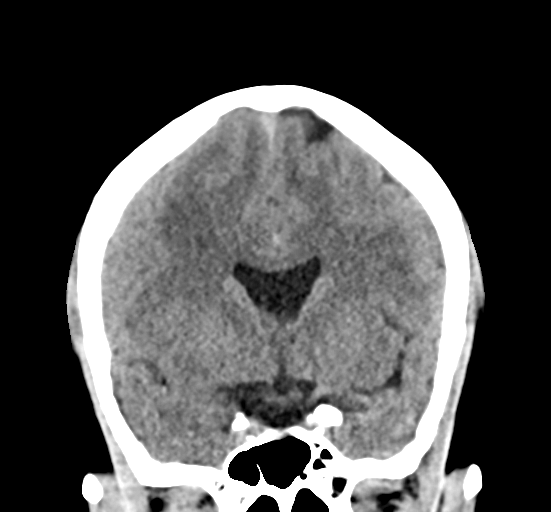
[im 36/65  brain]
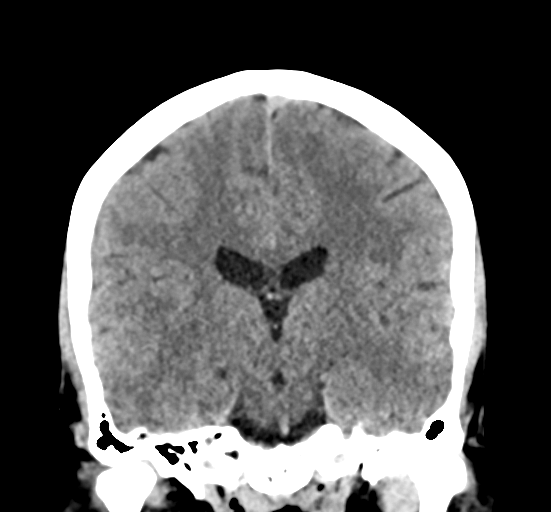

[Series 6: sag soft · sagittal · 0.29mm/px · 3 of 60 slices shown]
[im 20/60  brain]
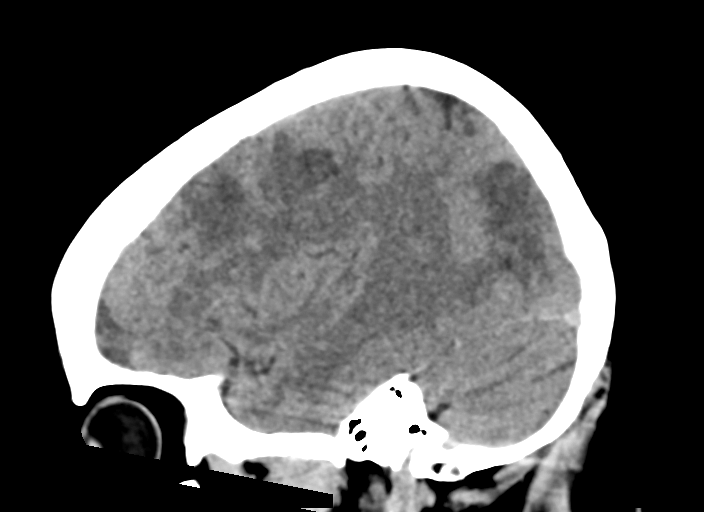
[im 30/60  brain]
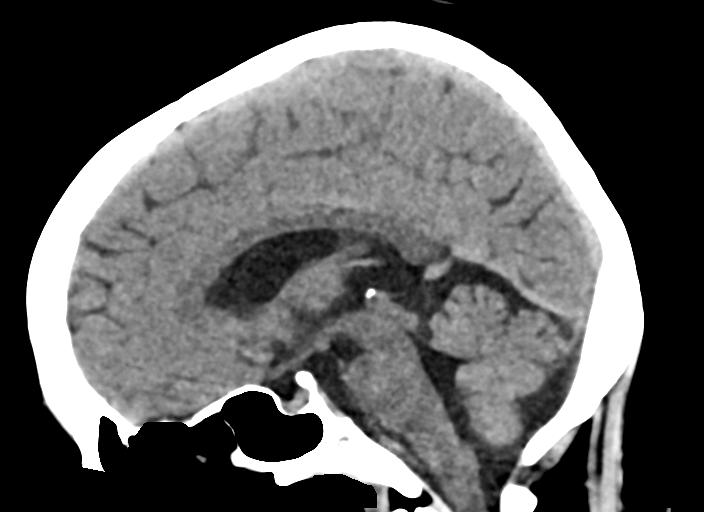
[im 40/60  brain]
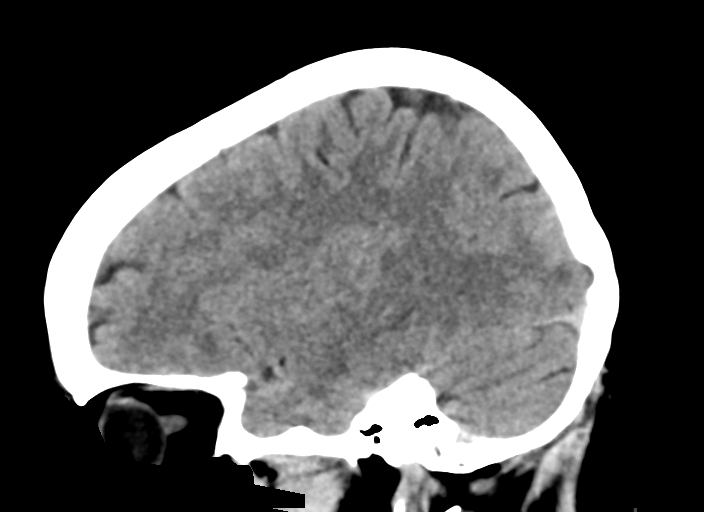

[15 of 47 positions shown; findings below may reference images not displayed]

FINDINGS: Brain: Multifocal confluent areas of gray and white matter
hypodensity are scattered in the right hemisphere, with wedge-shaped
peripheral areas of involvement in the right middle frontal gyrus,
right parietal lobe, and superior right occipital lobe. Additional
smaller areas of involvement including the superior right
peri-Rolandic cortex (series 3, image 25). No acute intracranial
hemorrhage identified. No associated intracranial mass effect.

Gray-white matter differentiation in the right temporal lobe and
right basal ganglia is preserved. Left hemisphere, thalamic, and
posterior fossa gray-white matter differentiation appears normal.

No ventriculomegaly.  Normal basilar cisterns.

Vascular: Calcified atherosclerosis at the skull base. No suspicious
intracranial vascular hyperdensity.

Skull: Negative.

Sinuses/Orbits: Clear.

Other: Visualized orbits and scalp soft tissues are within normal
limits.

ASPECTS (Alberta Stroke Program Early CT Score)

- Ganglionic level infarction (caudate, lentiform nuclei, internal
capsule, insula, M1-M3 cortex): 6 (abnormal M1 segment)

- Supraganglionic infarction (M4-M6 cortex): 0 (M4 through and 6
abnormal).

Total score (0-10 with 10 being normal): 6
IMPRESSION: 1. Multifocal subacute appearing cortical infarcts in the right
hemisphere, primarily the right MCA territory although there is also
superior right occipital lobe involvement. The right basal ganglia
are spared.
2. No associated hemorrhage or intracranial mass effect.
3. ASPECTS is 6.
4. These results were communicated to Dr. Grandmother at [DATE] [employer]
02/28/2017by text page via the AMION messaging system.

## 2018-04-09 NOTE — Progress Notes (Signed)
Carelink Summary Report / Loop Recorder 

## 2018-04-19 ENCOUNTER — Encounter: Payer: Self-pay | Admitting: Internal Medicine

## 2018-04-24 NOTE — Congregational Nurse Program (Signed)
CN office visit with interpretor Diu Hartshorn assisting.  Phone call to [facility] to confirm appointment on 04/26/2018.  Appointment has been cancelled until further notice due to the [facility] Virus Pandemic.  They will contact patient to reschedule.  She will also need to visit financial counselor at that time to renew orange card and apply for [facility] Financial assistance.  Brantley Fling RN, Congregational Nurse [phone]

## 2018-04-26 ENCOUNTER — Encounter: Payer: Self-pay | Admitting: Internal Medicine

## 2018-05-01 ENCOUNTER — Other Ambulatory Visit: Payer: Self-pay | Admitting: Internal Medicine

## 2018-05-01 DIAGNOSIS — Z8673 Personal history of transient ischemic attack (TIA), and cerebral infarction without residual deficits: Secondary | ICD-10-CM

## 2018-05-06 ENCOUNTER — Other Ambulatory Visit: Payer: Self-pay | Admitting: Internal Medicine

## 2018-05-06 ENCOUNTER — Ambulatory Visit (INDEPENDENT_AMBULATORY_CARE_PROVIDER_SITE_OTHER): Payer: Self-pay | Admitting: *Deleted

## 2018-05-06 ENCOUNTER — Other Ambulatory Visit: Payer: Self-pay

## 2018-05-06 DIAGNOSIS — I639 Cerebral infarction, unspecified: Secondary | ICD-10-CM

## 2018-05-06 LAB — CUP PACEART REMOTE DEVICE CHECK
Date Time Interrogation Session: 20200330020842
Implantable Pulse Generator Implant Date: 20190123

## 2018-05-13 NOTE — Progress Notes (Signed)
Carelink Summary Report / Loop Recorder 

## 2018-06-05 ENCOUNTER — Telehealth: Payer: Self-pay | Admitting: *Deleted

## 2018-06-05 NOTE — Telephone Encounter (Addendum)
I called pt via interpreter line [phone], Kristen Short.  He relayed via VM that appt to be cancelled on 06-14-18 due to our office schedule change needs to call back and reschedule witn JV/NP.  JV/NP using [email or web address]

## 2018-06-07 ENCOUNTER — Ambulatory Visit (INDEPENDENT_AMBULATORY_CARE_PROVIDER_SITE_OTHER): Payer: Self-pay | Admitting: *Deleted

## 2018-06-07 ENCOUNTER — Other Ambulatory Visit: Payer: Self-pay

## 2018-06-07 DIAGNOSIS — I639 Cerebral infarction, unspecified: Secondary | ICD-10-CM

## 2018-06-09 LAB — CUP PACEART REMOTE DEVICE CHECK
Date Time Interrogation Session: 20200502023548
Implantable Pulse Generator Implant Date: 20190123

## 2018-06-12 NOTE — Progress Notes (Signed)
Carelink Summary Report / Loop Recorder 

## 2018-06-14 ENCOUNTER — Ambulatory Visit: Payer: Self-pay | Admitting: Adult Health

## 2018-07-10 ENCOUNTER — Ambulatory Visit (INDEPENDENT_AMBULATORY_CARE_PROVIDER_SITE_OTHER): Payer: Self-pay | Admitting: *Deleted

## 2018-07-10 DIAGNOSIS — I639 Cerebral infarction, unspecified: Secondary | ICD-10-CM

## 2018-07-11 LAB — CUP PACEART REMOTE DEVICE CHECK
Date Time Interrogation Session: 20200604033939
Implantable Pulse Generator Implant Date: 20190123

## 2018-07-18 NOTE — Progress Notes (Signed)
Carelink Summary Report / Loop Recorder 

## 2018-07-29 ENCOUNTER — Other Ambulatory Visit: Payer: Self-pay | Admitting: Internal Medicine

## 2018-08-12 ENCOUNTER — Ambulatory Visit (INDEPENDENT_AMBULATORY_CARE_PROVIDER_SITE_OTHER): Payer: Self-pay | Admitting: *Deleted

## 2018-08-12 DIAGNOSIS — I639 Cerebral infarction, unspecified: Secondary | ICD-10-CM

## 2018-08-13 LAB — CUP PACEART REMOTE DEVICE CHECK
Date Time Interrogation Session: 20200707062251
Implantable Pulse Generator Implant Date: 20190123

## 2018-08-18 NOTE — Progress Notes (Signed)
Carelink Summary Report / Loop Recorder 

## 2018-09-15 LAB — CUP PACEART REMOTE DEVICE CHECK
Date Time Interrogation Session: 20200809093600
Implantable Pulse Generator Implant Date: 20190123

## 2018-09-16 ENCOUNTER — Ambulatory Visit (INDEPENDENT_AMBULATORY_CARE_PROVIDER_SITE_OTHER): Payer: Self-pay | Admitting: *Deleted

## 2018-09-16 DIAGNOSIS — I639 Cerebral infarction, unspecified: Secondary | ICD-10-CM

## 2018-09-23 NOTE — Progress Notes (Signed)
Carelink Summary Report / Loop Recorder 

## 2018-10-18 ENCOUNTER — Ambulatory Visit (INDEPENDENT_AMBULATORY_CARE_PROVIDER_SITE_OTHER): Payer: Self-pay | Admitting: *Deleted

## 2018-10-18 DIAGNOSIS — I639 Cerebral infarction, unspecified: Secondary | ICD-10-CM

## 2018-10-18 LAB — CUP PACEART REMOTE DEVICE CHECK
Date Time Interrogation Session: 20200911073324
Implantable Pulse Generator Implant Date: 20190123

## 2018-10-22 ENCOUNTER — Telehealth: Payer: Self-pay | Admitting: *Deleted

## 2018-10-22 ENCOUNTER — Other Ambulatory Visit: Payer: Self-pay | Admitting: Internal Medicine

## 2018-10-22 NOTE — Telephone Encounter (Signed)
Thank you for letting me know

## 2018-10-22 NOTE — Telephone Encounter (Signed)
Called carolyn cong nurse, she is not in office at this time. She has not spoken to pt since nov 2019.  I ask hme to call pt due to I thought pt may have some issues, she states she lives in [place] now, her spouse is the only means of transportation and has a new job and cannot ask off, she wants to come to appt but cannot at this time, she is trying to find a md in [place] but does not speak english or have the ability to do much, I will speak to carolyn again next week and see if she can help pt get in touch w/ somone in [place] to assist her

## 2018-10-25 NOTE — Progress Notes (Signed)
Carelink Summary Report / Loop Recorder 

## 2018-11-02 NOTE — Progress Notes (Deleted)
   CC: ***  HPI:  Ms.Sya Vanderpol is a 53 y.o. with diabetes, history of right mca stroke who presents for diabetes follow up. Please see problem based charting for evaluation, assessment, and plan.  Historically patient has not been able to make it to her pcp's appointment due to not having transportation. According to neurology note the "patient's husband blames the patient for having a stroke and for the large amount of hospital bills that need to be paid".    Dm  The patient's last a1c=7.5 in January 2019. The patient's home  blood glucose measurements over the past month have ranged ***. The patient does/does not note episodes of hypoglycemia.   The patient is currently taking ***. The patient is compliant with medication.    Patient's weight changes ***  Patient had a neurology follow up in April 2019   Past Medical History:  Diagnosis Date  . CVA (cerebral vascular accident) ([place])   . Left wrist fracture    Review of Systems:  ***  Physical Exam:  There were no vitals filed for this visit. ***  Assessment & Plan:   See Encounters Tab for problem based charting.  Patient {GC/GE:3044014::"discussed with","seen with"} Dr. {NAMES:[id number]::"Butcher","Guilloud","Hoffman","Mullen","Narendra","Raines","Vincent"}

## 2018-11-05 ENCOUNTER — Encounter: Payer: Self-pay | Admitting: Internal Medicine

## 2018-11-07 NOTE — Telephone Encounter (Signed)
Spoke w/ carolyn o'brien, she will get in touch with someone in [place] and f/u on pt if possible. She will update triage on pt

## 2018-11-07 NOTE — Telephone Encounter (Signed)
Ok thank you 

## 2018-11-13 ENCOUNTER — Telehealth: Payer: Self-pay

## 2018-11-13 NOTE — Telephone Encounter (Signed)
Phone call to [employer].  He knows this patient and is familiar with medical resources in [place] where he preaches twice per month.  He contacted patient and relates that she is familiar with a [place] doctor there who she will contact to establish care.  If she has difficulty doing this he told her he would assist her.  Jake Michaelis RN, Congregational Nurse [phone]

## 2018-11-15 ENCOUNTER — Other Ambulatory Visit: Payer: Self-pay | Admitting: Internal Medicine

## 2018-11-15 DIAGNOSIS — Z8673 Personal history of transient ischemic attack (TIA), and cerebral infarction without residual deficits: Secondary | ICD-10-CM

## 2018-11-20 ENCOUNTER — Ambulatory Visit (INDEPENDENT_AMBULATORY_CARE_PROVIDER_SITE_OTHER): Payer: Self-pay | Admitting: *Deleted

## 2018-11-20 DIAGNOSIS — I639 Cerebral infarction, unspecified: Secondary | ICD-10-CM

## 2018-11-20 LAB — CUP PACEART REMOTE DEVICE CHECK
Date Time Interrogation Session: 20201014102500
Implantable Pulse Generator Implant Date: 20190123

## 2018-12-02 NOTE — Progress Notes (Signed)
Carelink Summary Report / Loop Recorder 

## 2018-12-17 ENCOUNTER — Other Ambulatory Visit: Payer: Self-pay | Admitting: Internal Medicine

## 2018-12-17 DIAGNOSIS — Z8673 Personal history of transient ischemic attack (TIA), and cerebral infarction without residual deficits: Secondary | ICD-10-CM

## 2018-12-18 NOTE — Telephone Encounter (Signed)
Called patient no answer.

## 2018-12-18 NOTE — Telephone Encounter (Signed)
Patient needs an appointment with me asap

## 2018-12-23 ENCOUNTER — Ambulatory Visit (INDEPENDENT_AMBULATORY_CARE_PROVIDER_SITE_OTHER): Payer: Self-pay | Admitting: *Deleted

## 2018-12-23 DIAGNOSIS — I639 Cerebral infarction, unspecified: Secondary | ICD-10-CM

## 2018-12-24 LAB — CUP PACEART REMOTE DEVICE CHECK
Date Time Interrogation Session: 20201116170300
Implantable Pulse Generator Implant Date: 20190123

## 2019-01-16 NOTE — Progress Notes (Signed)
Carelink Summary Report / Loop Recorder 

## 2019-01-21 ENCOUNTER — Encounter: Payer: Self-pay | Admitting: Internal Medicine

## 2019-01-21 ENCOUNTER — Ambulatory Visit (INDEPENDENT_AMBULATORY_CARE_PROVIDER_SITE_OTHER): Payer: Self-pay | Admitting: Internal Medicine

## 2019-01-21 ENCOUNTER — Other Ambulatory Visit: Payer: Self-pay

## 2019-01-21 VITALS — BP 130/89 | HR 65 | Temp 98.3°F | Ht 60.0 in | Wt 164.9 lb

## 2019-01-21 DIAGNOSIS — M25512 Pain in left shoulder: Secondary | ICD-10-CM

## 2019-01-21 DIAGNOSIS — Z7982 Long term (current) use of aspirin: Secondary | ICD-10-CM

## 2019-01-21 DIAGNOSIS — E139 Other specified diabetes mellitus without complications: Secondary | ICD-10-CM

## 2019-01-21 DIAGNOSIS — I639 Cerebral infarction, unspecified: Secondary | ICD-10-CM

## 2019-01-21 DIAGNOSIS — Z79899 Other long term (current) drug therapy: Secondary | ICD-10-CM

## 2019-01-21 DIAGNOSIS — Z8673 Personal history of transient ischemic attack (TIA), and cerebral infarction without residual deficits: Secondary | ICD-10-CM

## 2019-01-21 DIAGNOSIS — E785 Hyperlipidemia, unspecified: Secondary | ICD-10-CM

## 2019-01-21 DIAGNOSIS — E119 Type 2 diabetes mellitus without complications: Secondary | ICD-10-CM

## 2019-01-21 DIAGNOSIS — Z7984 Long term (current) use of oral hypoglycemic drugs: Secondary | ICD-10-CM

## 2019-01-21 LAB — GLUCOSE, CAPILLARY: Glucose-Capillary: 130 mg/dL — ABNORMAL HIGH (ref 70–99)

## 2019-01-21 LAB — POCT GLYCOSYLATED HEMOGLOBIN (HGB A1C): Hemoglobin A1C: 8.4 % — AB (ref 4.0–5.6)

## 2019-01-21 MED ORDER — DICLOFENAC SODIUM 1 % EX GEL: 2.0000 g | Freq: Three times a day (TID) | CUTANEOUS | 1 refills | Status: DC | PRN

## 2019-01-21 MED ORDER — MELOXICAM 7.5 MG PO TABS: 7.5000 mg | ORAL_TABLET | Freq: Every day | ORAL | 0 refills | Status: DC

## 2019-01-21 MED ORDER — ASPIRIN EC 325 MG PO TBEC: 325.0000 mg | DELAYED_RELEASE_TABLET | Freq: Every day | ORAL | 3 refills | Status: DC

## 2019-01-21 MED ORDER — SITAGLIPTIN PHOS-METFORMIN HCL 50-1000 MG PO TABS: 1.0000 | ORAL_TABLET | Freq: Two times a day (BID) | ORAL | 0 refills | Status: DC

## 2019-01-21 MED ORDER — ATORVASTATIN CALCIUM 80 MG PO TABS: ORAL_TABLET | ORAL | 0 refills | Status: DC

## 2019-01-21 MED FILL — ATORVASTATIN 80 MG TABLET: 80 | 30 days supply | Qty: 30 | Fill #0

## 2019-01-21 MED FILL — JANUMET 50-1,000 MG TABLET: 50-1000 | 30 days supply | Qty: 60 | Fill #0

## 2019-01-21 NOTE — Patient Instructions (Signed)
It was a pleasure to see you today Ms. Rozak. Please make the following changes:  For your diabetes:  Please make sure to avoid sugary substances  Exercise 30 min regularly  Start taking janumet 50-1000mg  twice daily  Follow up in 1 month   For your left shoulder pain:  Please use voltaren gel and mobic on an as needed basis  You can also use a heating pad   For your cardiac loop recorder:  Follow with cardiology  For your Stroke history:  Please schedule follow up with neurology   If you have any questions or concerns, please call our clinic at [phone] between 9am-5pm and after hours call [phone] and ask for the internal medicine resident on call. If you feel you are having a medical emergency please call 911.   Thank you, we look forward to help you remain healthy!  Lars Mage, MD Internal Medicine PGY3

## 2019-01-21 NOTE — Progress Notes (Signed)
   CC: Diabetes follow up   HPI:  [name] is a 53 y.o. female with history of right frontal mca cryptogenic stroke in 2019, hld, diabetes who presents for follow up of diabetes. Please see problem based charting for evaluation, assessment, and plan.  Past Medical History:  Diagnosis Date  . CVA (cerebral vascular accident) ([place])   . Left wrist fracture    Review of Systems:    Constipation, abdominal bloating after eating Denies weakness, numbness, nausea and vomiting, cough, dypnea, chest pain  Physical Exam:  Vitals:   01/21/19 1424  BP: (!) 160/91  Pulse: 83  Temp: 98.3 F (36.8 C)  TempSrc: Oral  SpO2: 99%  Weight: 164 lb 14.4 oz (74.8 kg)  Height: 5' (1.524 m)   Physical Exam  Constitutional: She is oriented to person, place, and time. She appears well-developed and well-nourished. No distress.  HENT:  Head: Normocephalic and atraumatic.  Cardiovascular: Normal rate, regular rhythm and normal heart sounds.  Respiratory: Effort normal and breath sounds normal. No respiratory distress. She has no wheezes.  GI: Soft. Bowel sounds are normal. She exhibits no distension. There is no abdominal tenderness.  Musculoskeletal:     Comments: Tenderness to palpation at right posterior shoulder above superior aspect of trapezius muscle.   Neurological: She is alert and oriented to person, place, and time.  Skin: She is not diaphoretic.  Area of healing laceration for right shoulder  Psychiatric: She has a normal mood and affect. Her behavior is normal. Judgment and thought content normal.   Assessment & Plan:   See Encounters Tab for problem based charting.  Patient discussed with Dr. Philipp Ovens

## 2019-01-22 ENCOUNTER — Other Ambulatory Visit: Payer: Self-pay | Admitting: *Deleted

## 2019-01-22 DIAGNOSIS — M25512 Pain in left shoulder: Secondary | ICD-10-CM

## 2019-01-22 MED ORDER — MELOXICAM 7.5 MG PO TABS: 7.5000 mg | ORAL_TABLET | Freq: Every day | ORAL | 0 refills | Status: AC

## 2019-01-22 MED ORDER — DICLOFENAC SODIUM 1 % EX GEL: 2.0000 g | Freq: Three times a day (TID) | CUTANEOUS | 1 refills | Status: DC | PRN

## 2019-01-22 MED FILL — MELOXICAM 7.5 MG TABLET: 7.5 | 15 days supply | Qty: 15 | Fill #0

## 2019-01-22 MED FILL — DICLOFENAC SODIUM 1 % GEL: 1 | 17 days supply | Qty: 100 | Fill #0

## 2019-01-22 NOTE — Telephone Encounter (Signed)
Please re-send rxs with "IM Program". Thanks 

## 2019-01-22 NOTE — Assessment & Plan Note (Signed)
Patient has left shoulder pain that started 2 weeks ago after she hit her shoulder on a table. She states that her pain is constant, severe, and interfering with her daily tasks. She has not used any medication to alleviate the pain   Assessment and plan  The paitent's pain is likely musculoskeletal secondary to recent trauma. Will give patient topical voltaren gel and mobic for pain relief. Also told her she can use a heating pad. Advised her to monitor for gi bleed as she is also on aspirin.

## 2019-01-22 NOTE — Assessment & Plan Note (Signed)
Patient's last a1c was checked in January 2019 at which time it was 7.5. At this visit the patient's a1c is 8.4. She has gained 7lbs in the past 1 year. The patient has not been taking her metformin as she ran out of the medication.   Assessment and plan  The patient's blood glucose levels are uncontrolled. Will add Januvia to regimen.   -start janumet 50-1000mg  bid  -record blood glucose readings  -return in 2 weeks

## 2019-01-22 NOTE — Assessment & Plan Note (Signed)
Patient had a cyrptogenic stroke in January 2019. MRI showed large right frontal mca and right posterior watershed territory infacts with small subacute right frontal infarct as well. Loop recorder does not show atrial fibrillation.   Husband has not been taking her to apointments and blames her for having stroke and large hosptial bill as reported by neurology.   -follow up with neurology  -continue aspirin 325mg  qd  -continue lipitor 80mg  qd

## 2019-01-22 NOTE — Progress Notes (Signed)
Internal Medicine Clinic Attending  Case discussed with Dr. Chundi at the time of the visit.  We reviewed the resident's history and exam and pertinent patient test results.  I agree with the assessment, diagnosis, and plan of care documented in the resident's note. 

## 2019-01-23 ENCOUNTER — Other Ambulatory Visit: Payer: Self-pay | Admitting: Internal Medicine

## 2019-01-23 DIAGNOSIS — I639 Cerebral infarction, unspecified: Secondary | ICD-10-CM

## 2019-01-23 DIAGNOSIS — Z8673 Personal history of transient ischemic attack (TIA), and cerebral infarction without residual deficits: Secondary | ICD-10-CM

## 2019-01-23 LAB — BMP8+ANION GAP
Anion Gap: 19 mmol/L — ABNORMAL HIGH (ref 10.0–18.0)
BUN/Creatinine Ratio: 8 — ABNORMAL LOW (ref 9–23)
BUN: 8 mg/dL (ref 6–24)
CO2: 20 mmol/L (ref 20–29)
Calcium: 10.6 mg/dL — ABNORMAL HIGH (ref 8.7–10.2)
Chloride: 114 mmol/L — ABNORMAL HIGH (ref 96–106)
Creatinine, Ser: 1 mg/dL (ref 0.57–1.00)
GFR calc Af Amer: 74 mL/min/{1.73_m2} (ref >59)
GFR calc non Af Amer: 64 mL/min/{1.73_m2} (ref >59)
Glucose: 138 mg/dL — ABNORMAL HIGH (ref 65–99)
Potassium: 4.4 mmol/L (ref 3.5–5.2)
Sodium: 153 mmol/L — ABNORMAL HIGH (ref 134–144)

## 2019-01-27 ENCOUNTER — Ambulatory Visit (INDEPENDENT_AMBULATORY_CARE_PROVIDER_SITE_OTHER): Payer: Self-pay | Admitting: *Deleted

## 2019-01-27 DIAGNOSIS — I639 Cerebral infarction, unspecified: Secondary | ICD-10-CM

## 2019-01-27 LAB — CUP PACEART REMOTE DEVICE CHECK
Date Time Interrogation Session: 20201219120413
Implantable Pulse Generator Implant Date: 20190123

## 2019-01-29 NOTE — Progress Notes (Signed)
LAB APPT [facility] 02/10/2019

## 2019-02-05 NOTE — Addendum Note (Signed)
Addended by: Truddie Crumble on: 02/05/2019 10:07 AM   Modules accepted: Orders

## 2019-02-10 ENCOUNTER — Other Ambulatory Visit: Payer: Self-pay

## 2019-02-15 ENCOUNTER — Other Ambulatory Visit: Payer: Self-pay | Admitting: Internal Medicine

## 2019-02-15 DIAGNOSIS — I639 Cerebral infarction, unspecified: Secondary | ICD-10-CM

## 2019-02-15 DIAGNOSIS — Z8673 Personal history of transient ischemic attack (TIA), and cerebral infarction without residual deficits: Secondary | ICD-10-CM

## 2019-02-20 ENCOUNTER — Ambulatory Visit: Payer: Self-pay

## 2019-03-03 ENCOUNTER — Ambulatory Visit (INDEPENDENT_AMBULATORY_CARE_PROVIDER_SITE_OTHER): Payer: Self-pay | Admitting: *Deleted

## 2019-03-03 DIAGNOSIS — I639 Cerebral infarction, unspecified: Secondary | ICD-10-CM

## 2019-03-03 LAB — CUP PACEART REMOTE DEVICE CHECK
Date Time Interrogation Session: 20210124232727
Implantable Pulse Generator Implant Date: 20190123

## 2019-03-26 ENCOUNTER — Emergency Department (HOSPITAL_COMMUNITY)
Admission: EM | Admit: 2019-03-26 | Discharge: 2019-03-27 | Disposition: A | Discharge status: Home / Self Care | Payer: 59 | Attending: Emergency Medicine | Admitting: Emergency Medicine

## 2019-03-26 ENCOUNTER — Emergency Department (HOSPITAL_COMMUNITY): Payer: 59

## 2019-03-26 ENCOUNTER — Encounter (HOSPITAL_COMMUNITY): Payer: Self-pay | Admitting: *Deleted

## 2019-03-26 ENCOUNTER — Other Ambulatory Visit: Payer: Self-pay

## 2019-03-26 DIAGNOSIS — R079 Chest pain, unspecified: Secondary | ICD-10-CM

## 2019-03-26 DIAGNOSIS — R002 Palpitations: Secondary | ICD-10-CM | POA: Diagnosis not present

## 2019-03-26 DIAGNOSIS — Z7982 Long term (current) use of aspirin: Secondary | ICD-10-CM | POA: Diagnosis not present

## 2019-03-26 DIAGNOSIS — R0789 Other chest pain: Secondary | ICD-10-CM | POA: Insufficient documentation

## 2019-03-26 DIAGNOSIS — Z79899 Other long term (current) drug therapy: Secondary | ICD-10-CM | POA: Diagnosis not present

## 2019-03-26 LAB — CBC
HCT: 46.7 % — ABNORMAL HIGH (ref 36.0–46.0)
Hemoglobin: 13.9 g/dL (ref 12.0–15.0)
MCH: 24.1 pg — ABNORMAL LOW (ref 26.0–34.0)
MCHC: 29.8 g/dL — ABNORMAL LOW (ref 30.0–36.0)
MCV: 81.1 fL (ref 80.0–100.0)
Platelets: 211 10*3/uL (ref 150–400)
RBC: 5.76 MIL/uL — ABNORMAL HIGH (ref 3.87–5.11)
RDW: 14.6 % (ref 11.5–15.5)
WBC: 7 10*3/uL (ref 4.0–10.5)
nRBC: 0 % (ref 0.0–0.2)

## 2019-03-26 LAB — BASIC METABOLIC PANEL
Anion gap: 11 (ref 5–15)
BUN: 14 mg/dL (ref 6–20)
CO2: 24 mmol/L (ref 22–32)
Calcium: 9.6 mg/dL (ref 8.9–10.3)
Chloride: 104 mmol/L (ref 98–111)
Creatinine, Ser: 1.11 mg/dL — ABNORMAL HIGH (ref 0.44–1.00)
GFR calc Af Amer: >60 mL/min (ref >60)
GFR calc non Af Amer: 56 mL/min — ABNORMAL LOW (ref >60)
Glucose, Bld: 226 mg/dL — ABNORMAL HIGH (ref 70–99)
Potassium: 4.4 mmol/L (ref 3.5–5.1)
Sodium: 139 mmol/L (ref 135–145)

## 2019-03-26 LAB — TROPONIN I (HIGH SENSITIVITY)
Troponin I (High Sensitivity): <2 ng/L (ref <18)
Troponin I (High Sensitivity): <2 ng/L (ref <18)

## 2019-03-26 LAB — I-STAT BETA HCG BLOOD, ED (MC, WL, AP ONLY): I-stat hCG, quantitative: <5 m[IU]/mL (ref <5)

## 2019-03-26 MED ORDER — KETOROLAC TROMETHAMINE 60 MG/2ML IM SOLN
60.0000 mg | Freq: Once | INTRAMUSCULAR | Status: AC
  Administered 2019-03-26: 60 mg via INTRAMUSCULAR
  Filled 2019-03-26: qty 2

## 2019-03-26 MED ORDER — SODIUM CHLORIDE 0.9% FLUSH: 3.0000 mL | Freq: Once | INTRAVENOUS | Status: DC

## 2019-03-26 MED ORDER — MELOXICAM 7.5 MG PO TABS: 7.5000 mg | ORAL_TABLET | Freq: Every day | ORAL | 0 refills | Status: DC

## 2019-03-26 MED ORDER — OXYCODONE-ACETAMINOPHEN 5-325 MG PO TABS
1.0000 | ORAL_TABLET | Freq: Once | ORAL | Status: AC
  Administered 2019-03-26: 1 via ORAL
  Filled 2019-03-26: qty 1

## 2019-03-26 NOTE — ED Triage Notes (Signed)
The pt is c/o chest pain for 3-4 days/  Recent stroke   She speaks [employer]  Alert no distress

## 2019-03-26 NOTE — ED Provider Notes (Signed)
Emergency Department Provider Note   I have reviewed the triage vital signs and the nursing notes.   HISTORY  Chief Complaint Chest Pain   HPI Kristen Short is a 54 y.o. female with history as diagnosed below who presents the emerge department today for chest pain.  The history was relatively difficult to obtain however it sounds like she had a fall a few days ago she tripped over something and since that time she has had unrelenting chest pain.  Worse with taking deep breaths and worse with certain positions.  Also worse with palpations.  She was told that she needed to come here for an MRI.  She has not had any cough or fever or infectious symptoms.  She has had any nausea, vomiting, lightheadedness, shortness of breath or other associated symptoms.  No trauma elsewhere.  She states that she does have some muscular soreness in her upper back.   No other associated or modifying symptoms.    Past Medical History:  Diagnosis Date  . CVA (cerebral vascular accident) ([place])   . Left wrist fracture     Patient Active Problem List   Diagnosis Date Noted  . Cryptogenic stroke ([place]) 05/17/2017  . Left shoulder pain 04/18/2017  . Left hemiparesis ([place])   . History of ischemic right MCA stroke 03/05/2017  . Cognitive deficit, post-stroke   . Itching   . Diabetes ([place])   . AKI (acute kidney injury) ([place])   . Slow transit constipation   . Left hemiplegia ([place])   . Hyperlipidemia     Past Surgical History:  Procedure Laterality Date  . LOOP RECORDER INSERTION N/A 03/05/2017   Procedure: LOOP RECORDER INSERTION;  Surgeon: Evans Lance, MD;  Location: [place] [facility];  Service: Cardiovascular;  Laterality: N/A;  . TEE WITHOUT CARDIOVERSION N/A 03/05/2017   Procedure: TRANSESOPHAGEAL ECHOCARDIOGRAM (TEE);  Surgeon: Josue Hector, MD;  Location[facility] ENDOSCOPY;  Service: Cardiovascular;  Laterality: N/A;    Current Outpatient Rx  . Order #: [id number] Class: Historical Med  . Order  #: [id number] Class: Normal  . Order #: [id number] Class: Normal  . Order #: [id number] Class: Normal  . Order #: [id number] Class: Print  . Order #: [id number] Class: Normal    Allergies Patient has no known allergies.  Family History  Problem Relation Age of Onset  . High blood pressure Sister   . Heart Problems Sister        angina due to vasospasms  . Stroke Sister   . Stroke Father   . Stroke Maternal Grandmother     Social History Social History   Tobacco Use  . Smoking status: Never Smoker  . Smokeless tobacco: Never Used  Substance Use Topics  . Alcohol use: No  . Drug use: No    Review of Systems  All other systems negative except as documented in the HPI. All pertinent positives and negatives as reviewed in the HPI. ____________________________________________   PHYSICAL EXAM:  VITAL SIGNS: ED Triage Vitals  Enc Vitals Group     BP 03/26/19 1715 139/84     Pulse Rate 03/26/19 1715 96     Resp 03/26/19 1715 16     Temp 03/26/19 1715 98.4 F (36.9 C)     Temp Source 03/26/19 1715 Oral     SpO2 03/26/19 1715 99 %     Weight 03/26/19 1708 164 lb 14.5 oz (74.8 kg)    Constitutional: Alert and oriented. Well appearing and in no acute distress. Eyes: Conjunctivae  are normal. PERRL. EOMI. Head: Atraumatic. Nose: No congestion/rhinnorhea. Mouth/Throat: Mucous membranes are moist.  Oropharynx non-erythematous. Neck: No stridor.  No meningeal signs.   Cardiovascular: Normal rate, regular rhythm. Good peripheral circulation. Grossly normal heart sounds.   Respiratory: Normal respiratory effort.  No retractions. Lungs CTAB. Gastrointestinal: Soft and nontender. No distention.  Musculoskeletal: No lower extremity tenderness nor edema. No gross deformities of extremities.  Has significant pain to palpation of her anterior chest.  She states this exactly reproduces the pain she has been experiencing for the last 3 to 4 days since her fall.  No rashes. Neurologic:  Normal  speech and language. No gross focal neurologic deficits are appreciated.  Skin:  Skin is warm, dry and intact. No rash noted.  ____________________________________________   LABS (all labs ordered are listed, but only abnormal results are displayed)  Labs Reviewed  BASIC METABOLIC PANEL - Abnormal; Notable for the following components:      Result Value   Glucose, Bld 226 (*)    Creatinine, Ser 1.11 (*)    GFR calc non Af Amer 56 (*)    All other components within normal limits  CBC - Abnormal; Notable for the following components:   RBC 5.76 (*)    HCT 46.7 (*)    MCH 24.1 (*)    MCHC 29.8 (*)    All other components within normal limits  I-STAT BETA HCG BLOOD, ED (MC, WL, AP ONLY)  TROPONIN I (HIGH SENSITIVITY)  TROPONIN I (HIGH SENSITIVITY)   ____________________________________________  EKG   EKG Interpretation  Date/Time:  Wednesday March 26 2019 17:06:08 EST Ventricular Rate:  87 PR Interval:  154 QRS Duration: 86 QT Interval:  352 QTC Calculation: 423 R Axis:   35 Text Interpretation: Normal sinus rhythm Nonspecific T wave abnormality Abnormal ECG No significant change since last tracing Confirmed by Marily Memos [phone]) on 03/26/2019 10:57:21 PM       ____________________________________________  RADIOLOGY  DG Chest 2 View  Result Date: 03/26/2019 CLINICAL DATA:  Chest pain EXAM: CHEST - 2 VIEW COMPARISON:  02/28/2017 chest radiograph. FINDINGS: Loop recorder overlies the left heart. Stable cardiomediastinal silhouette with top-normal heart size. No pneumothorax. No pleural effusion. Lungs appear clear, with no acute consolidative airspace disease and no pulmonary edema. IMPRESSION: No active cardiopulmonary disease. Electronically Signed   By: Delbert Phenix M.D.   On: 03/26/2019 18:28    ____________________________________________  INITIAL IMPRESSION / ASSESSMENT AND PLAN / ED COURSE  Patient ruled out for ACS prior to my evaluation.  On my  evaluation patient has what seems to be obvious musculoskeletal pain.  Will treat as such.  Low suspicion for infectious, cardiac causes.  Doubt she has pulmonary embolus.  Pertinent labs & imaging results that were available during my care of the patient were reviewed by me and considered in my medical decision making (see chart for details).  A medical screening exam was performed and I feel the patient has had an appropriate workup for their chief complaint at this time and likelihood of emergent condition existing is low. They have been counseled on decision, discharge, follow up and which symptoms necessitate immediate return to the emergency department. They or their family verbally stated understanding and agreement with plan and discharged in stable condition.   ____________________________________________  FINAL CLINICAL IMPRESSION(S) / ED DIAGNOSES  Final diagnoses:  Chest pain, unspecified type     MEDICATIONS GIVEN DURING THIS VISIT:  Medications  sodium chloride flush (NS) 0.9 % injection  3 mL (has no administration in time range)  ketorolac (TORADOL) injection 60 mg (60 mg Intramuscular Given 03/26/19 2346)  oxyCODONE-acetaminophen (PERCOCET/ROXICET) 5-325 MG per tablet 1 tablet (1 tablet Oral Given 03/26/19 2346)     NEW OUTPATIENT MEDICATIONS STARTED DURING THIS VISIT:  Discharge Medication List as of 03/26/2019 11:57 PM    START taking these medications   Details  meloxicam (MOBIC) 7.5 MG tablet Take 1 tablet (7.5 mg total) by mouth daily., Starting Wed 03/26/2019, Print        Note:  This note was prepared with assistance of Dragon voice recognition software. Occasional wrong-word or sound-a-like substitutions may have occurred due to the inherent limitations of voice recognition software.   Rolonda Pontarelli, Barbara Cower, MD 03/27/19 [phone]

## 2019-03-27 NOTE — ED Notes (Signed)
Using niece to interpret pt verbalized understanding of d/c instructions, follow up care, scripts and s/s requiring return to ED. Pt had no further questions at this time.

## 2019-04-07 ENCOUNTER — Ambulatory Visit (INDEPENDENT_AMBULATORY_CARE_PROVIDER_SITE_OTHER): Payer: 59 | Admitting: *Deleted

## 2019-04-07 DIAGNOSIS — I639 Cerebral infarction, unspecified: Secondary | ICD-10-CM | POA: Diagnosis not present

## 2019-04-07 LAB — CUP PACEART REMOTE DEVICE CHECK
Date Time Interrogation Session: 20210228231414
Implantable Pulse Generator Implant Date: 20190123

## 2019-04-07 NOTE — Progress Notes (Signed)
ILR Remote 

## 2019-04-15 ENCOUNTER — Other Ambulatory Visit: Payer: Self-pay

## 2019-04-15 ENCOUNTER — Encounter: Payer: Self-pay | Admitting: Internal Medicine

## 2019-04-15 ENCOUNTER — Ambulatory Visit: Payer: 59 | Admitting: Internal Medicine

## 2019-04-15 DIAGNOSIS — I69354 Hemiplegia and hemiparesis following cerebral infarction affecting left non-dominant side: Secondary | ICD-10-CM | POA: Diagnosis not present

## 2019-04-15 DIAGNOSIS — I639 Cerebral infarction, unspecified: Secondary | ICD-10-CM

## 2019-04-15 DIAGNOSIS — G43009 Migraine without aura, not intractable, without status migrainosus: Secondary | ICD-10-CM | POA: Diagnosis not present

## 2019-04-15 DIAGNOSIS — Z8673 Personal history of transient ischemic attack (TIA), and cerebral infarction without residual deficits: Secondary | ICD-10-CM

## 2019-04-15 DIAGNOSIS — Z79899 Other long term (current) drug therapy: Secondary | ICD-10-CM

## 2019-04-15 DIAGNOSIS — Z7982 Long term (current) use of aspirin: Secondary | ICD-10-CM

## 2019-04-15 MED ORDER — ASPIRIN EC 325 MG PO TBEC: 325.0000 mg | DELAYED_RELEASE_TABLET | Freq: Every day | ORAL | 3 refills | Status: DC

## 2019-04-15 MED ORDER — ATORVASTATIN CALCIUM 80 MG PO TABS: 80.0000 mg | ORAL_TABLET | Freq: Every day | ORAL | 3 refills | Status: DC

## 2019-04-15 MED ORDER — AMITRIPTYLINE HCL 10 MG PO TABS: 10.0000 mg | ORAL_TABLET | Freq: Every day | ORAL | 2 refills | Status: DC

## 2019-04-15 MED FILL — AMITRIPTYLINE HCL 10 MG TAB: 10 | 30 days supply | Qty: 30 | Fill #0

## 2019-04-15 MED FILL — ATORVASTATIN 80 MG TABLET: 80 | 30 days supply | Qty: 30 | Fill #0

## 2019-04-15 MED FILL — ASPIRIN EC 325 MG TABLET: 325 | 30 days supply | Qty: 30 | Fill #0

## 2019-04-15 NOTE — Progress Notes (Signed)
   CC: Headache, History of stroke  HPI:  [name] is a 54 y.o. F with PMHx listed below presenting for Headache, History of stroke. Please see the A&P for the status of the patient's chronic medical problems.  Of note, patient was seen in ED recently for MSK pain. Work up there was benign. She has had improvement with NSAIDs.  Past Medical History:  Diagnosis Date  . CVA (cerebral vascular accident) ([facility])   . Left wrist fracture    Review of Systems:  Performed and all others negative.  Physical Exam:  Vitals:   04/15/19 1430  BP: (!) 157/67  Pulse: 88  SpO2: 100%  Weight: 161 lb 6.4 oz (73.2 kg)   Physical Exam Constitutional:      General: She is not in acute distress.    Appearance: Normal appearance.  Cardiovascular:     Rate and Rhythm: Normal rate and regular rhythm.     Pulses: Normal pulses.     Heart sounds: Normal heart sounds.  Pulmonary:     Effort: Pulmonary effort is normal. No respiratory distress.     Breath sounds: Normal breath sounds.  Abdominal:     General: Bowel sounds are normal. There is no distension.     Palpations: Abdomen is soft.     Tenderness: There is no abdominal tenderness.  Musculoskeletal:        General: No swelling or deformity.  Skin:    General: Skin is warm and dry.  Neurological:     Mental Status: Mental status is at baseline.     Comments: Motor: Good effort thorughout, at Least 5/5 bilateral UE, 5/5 bilateral lower extremity large muscle groups. Persistently decreased strength in left hand.    Assessment & Plan:   See Encounters Tab for problem based charting.  Patient discussed with Dr. Criselda Peaches

## 2019-04-15 NOTE — Patient Instructions (Addendum)
Thank you for allowing [place] to care for you   For your headache:   - This sound like a migraine headache    - I have prescribed Amitriptyline 10mg  to take each night to prevent headaches    For your history of stroke    - I have refilled you medications    - I have placed a new referral to Neurology, you should be contacted to schedule a visit

## 2019-04-15 NOTE — Assessment & Plan Note (Signed)
Cryptogenic stroke in 2019, has not seen Neurology in some time. She needs a neurology appointment, unclear if she needs new referral as she has been seen by them before (but it was over 1 year ago). Will place referral to help expedite appointment as she has difficulty scheduling her appointments given she speaks an uncommon dialect ([place]). I have included the information for the translator used in our office today. She had left sided deficits from stroke (UE and LE) which have improved. Large muscle group strength is 5/5 but her L hand muscle strength remains diminished. - Referral to neurology - Refill ASA and Lipitor

## 2019-04-15 NOTE — Assessment & Plan Note (Addendum)
Patient reports 2 weeks of intermittent headache. This headache has been coming and going for 2 weeks. The headache are described as a unilateral, pounding headache (on the right side). She reports some sensitivity to sound. She also reports aura of spot of light prior to the headache. She reports some mild unmasking of her post stroke symptoms after the headache with some left arm stiffness and mild pain, which improves on it own. No new FND.  She has been taking some Meloxicam for chest wall pain that helps some with the headache, but does not prevent it. I will start her on a preventative medicine today and she is following up with neurology as well for her history of stroke now that she has insurance. - Amitriptyline 10mg  qhs (preferred at this time due to const concerns) - Referral to neurology

## 2019-04-16 NOTE — Progress Notes (Signed)
Internal Medicine Clinic Attending  Case discussed with Dr. Melvin  at the time of the visit.  We reviewed the resident's history and exam and pertinent patient test results.  I agree with the assessment, diagnosis, and plan of care documented in the resident's note.  

## 2019-04-22 ENCOUNTER — Encounter: Payer: Self-pay | Admitting: *Deleted

## 2019-05-12 ENCOUNTER — Ambulatory Visit (INDEPENDENT_AMBULATORY_CARE_PROVIDER_SITE_OTHER): Payer: 59 | Admitting: *Deleted

## 2019-05-12 DIAGNOSIS — I639 Cerebral infarction, unspecified: Secondary | ICD-10-CM | POA: Diagnosis not present

## 2019-05-13 LAB — CUP PACEART REMOTE DEVICE CHECK
Date Time Interrogation Session: 20210401001856
Implantable Pulse Generator Implant Date: 20190123

## 2019-05-21 ENCOUNTER — Other Ambulatory Visit: Payer: Self-pay | Admitting: Internal Medicine

## 2019-05-21 DIAGNOSIS — I639 Cerebral infarction, unspecified: Secondary | ICD-10-CM

## 2019-05-21 DIAGNOSIS — Z8673 Personal history of transient ischemic attack (TIA), and cerebral infarction without residual deficits: Secondary | ICD-10-CM

## 2019-05-26 ENCOUNTER — Institutional Professional Consult (permissible substitution): Payer: Self-pay | Admitting: Neurology

## 2019-05-26 ENCOUNTER — Encounter: Payer: Self-pay | Admitting: Neurology

## 2019-05-27 ENCOUNTER — Telehealth: Payer: Self-pay

## 2019-05-27 NOTE — Addendum Note (Signed)
Addended by: Neomia Dear on: 05/27/2019 06:54 AM   Modules accepted: Orders

## 2019-05-27 NOTE — Telephone Encounter (Signed)
Interpreter was here for pt appt on 05/26/2019. They waited 35 minutes for pt. Pt was a no show for appt.

## 2019-06-14 LAB — CUP PACEART REMOTE DEVICE CHECK
Date Time Interrogation Session: 20210502002819
Implantable Pulse Generator Implant Date: 20190123

## 2019-06-16 ENCOUNTER — Ambulatory Visit (INDEPENDENT_AMBULATORY_CARE_PROVIDER_SITE_OTHER): Payer: 59 | Admitting: *Deleted

## 2019-06-16 DIAGNOSIS — I639 Cerebral infarction, unspecified: Secondary | ICD-10-CM

## 2019-06-17 NOTE — Progress Notes (Signed)
Carelink Summary Report / Loop Recorder 

## 2019-06-25 ENCOUNTER — Other Ambulatory Visit: Payer: Self-pay | Admitting: Internal Medicine

## 2019-06-25 DIAGNOSIS — I639 Cerebral infarction, unspecified: Secondary | ICD-10-CM

## 2019-06-25 DIAGNOSIS — Z8673 Personal history of transient ischemic attack (TIA), and cerebral infarction without residual deficits: Secondary | ICD-10-CM

## 2019-07-15 ENCOUNTER — Encounter: Payer: Self-pay | Admitting: Internal Medicine

## 2019-07-15 ENCOUNTER — Other Ambulatory Visit: Payer: Self-pay

## 2019-07-15 ENCOUNTER — Ambulatory Visit: Payer: 59 | Admitting: Internal Medicine

## 2019-07-15 VITALS — BP 144/65 | HR 69 | Temp 97.9°F | Wt 163.7 lb

## 2019-07-15 DIAGNOSIS — E119 Type 2 diabetes mellitus without complications: Secondary | ICD-10-CM

## 2019-07-15 DIAGNOSIS — G44209 Tension-type headache, unspecified, not intractable: Secondary | ICD-10-CM | POA: Diagnosis not present

## 2019-07-15 DIAGNOSIS — E139 Other specified diabetes mellitus without complications: Secondary | ICD-10-CM

## 2019-07-15 DIAGNOSIS — Z8673 Personal history of transient ischemic attack (TIA), and cerebral infarction without residual deficits: Secondary | ICD-10-CM | POA: Diagnosis not present

## 2019-07-15 LAB — POCT GLYCOSYLATED HEMOGLOBIN (HGB A1C): Hemoglobin A1C: 9 % — AB (ref 4.0–5.6)

## 2019-07-15 LAB — GLUCOSE, CAPILLARY: Glucose-Capillary: 226 mg/dL — ABNORMAL HIGH (ref 70–99)

## 2019-07-15 MED ORDER — SYNJARDY 5-1000 MG PO TABS: 1.0000 | ORAL_TABLET | Freq: Two times a day (BID) | ORAL | 1 refills | Status: DC

## 2019-07-15 MED ORDER — CELECOXIB 200 MG PO CAPS: 200.0000 mg | ORAL_CAPSULE | Freq: Every day | ORAL | 0 refills | Status: DC | PRN

## 2019-07-15 NOTE — Patient Instructions (Addendum)
It was a pleasure to see you today Ms. Archila. Please make the following changes:  -please get covid vaccination  -please start synjardy 5-1000mg  twice daily  -follow up with neurology -use celebrex 200mg  as needed for headache -please follow up with physician in 4 weeks -chronic care management to assist in finding doctors closer to your home in [place]   If you have any questions or concerns, please call our clinic at [phone] between 9am-5pm and after hours call [phone] and ask for the internal medicine resident on call. If you feel you are having a medical emergency please call 911.   Thank you, we look forward to help you remain healthy!  [phone], MD Internal Medicine PGY3   To schedule an appointment for a COVID vaccine or be added to the vaccine wait list: Go to [employer]   OR Go to [email or web address]                  OR Call [phone]                                     OR Call [phone] and select Option 2  [phone] [facility] [place]) [place] [address] [facility]. [place], [place] Hours: Mon,Thu 8-5, Sat 8-12 Type: Pfizer (12+)  [id number]  [facility]) [place] [employer] [address] [place], [place] Hours: Thu: 1-5 Type: Pfizer (12+)

## 2019-07-15 NOTE — Assessment & Plan Note (Signed)
The patient had a history of a right MCA stroke.  She should be continued on atorvastatin 80 mg daily and aspirin 325 mg daily.  Patient states that she has been out of her atorvastatin although I do see refills in the chart.  We sent medication to pharmacy in [place].  Patient has still not followed up with neurology.  -Follow-up neurology -Continue aspirin and atorvastatin

## 2019-07-15 NOTE — Addendum Note (Signed)
Addended by: Neomia Dear on: 07/15/2019 05:36 PM   Modules accepted: Orders

## 2019-07-15 NOTE — Assessment & Plan Note (Addendum)
Patient's A1c during this visit is 9.0.  The patient was being prescribed sitagliptin-Metformin 50-1000 mg twice daily, however the patient states that she was told that she does not have diabetes and has not been taking the medication.  Assessment and plan Counseled the patient on her diagnosis of diabetes and the importance of medication adherence.  Due to possible side effects with DPP 4 inhibitor and Metformin will switch medication to Synjardy 06-998 mg twice daily.  Patient should follow-up in 4 weeks.  She will have a retinal scan or exam during this visit.  We will obtain microalbumin/creatinine urine ratio.

## 2019-07-15 NOTE — Assessment & Plan Note (Signed)
Patient states that she has been having headaches in the back of her head radiating down neck to shoulders that is 7/10 intensity. Patient states that the pain lasts few hrs-day occurring 2-3 times per week.  Worsened with loud noises and alleviated with low back.   Assessment and plan  Patient signs and symptoms are consistent with a tension headache.  We will prescribed Celebrex 200 mg daily as needed.  Patient does not have any focal neurological deficits at this time that is concerning for an acute stroke.  She should follow-up with neurology due to history of stroke.

## 2019-07-15 NOTE — Progress Notes (Signed)
   CC: Diabetes mellitus  HPI:  [name] is a 54 y.o. with history of MCA stroke, diabetes presents for follow-up of diabetes. Please see problem based charting for evaluation, assessment, and plan.  Past Medical History:  Diagnosis Date  . 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 CVA (cerebral vascular accident) (HCC)   . Left hemiplegia (HCC)   . Left wrist fracture    Review of Systems:  Has headaches, fatigue  Physical Exam:  Vitals:   07/15/19 1324  BP: (!) 144/65  Pulse: 69  Temp: 97.9 F (36.6 C)  TempSrc: Oral  SpO2: 100%  Weight: 163 lb 11.2 oz (74.3 kg)   Physical Exam  Constitutional: She appears well-developed and well-nourished. No distress.  HENT:  Head: Normocephalic and atraumatic.  Eyes: Conjunctivae are normal.  Cardiovascular: Normal rate, regular rhythm and normal heart sounds.  Pulmonary/Chest: Effort normal and breath sounds normal. No respiratory distress. She has no wheezes.  Abdominal: Soft. Bowel sounds are normal. She exhibits no distension. There is no abdominal tenderness.  Musculoskeletal:        General: No edema.     Comments: 4/5 strength in left upper extremity, other extremities 5/5 strength. Sensation intact  Skin: She is not diaphoretic.    Assessment & Plan:   See Encounters Tab for problem based charting.  Patient discussed with Dr. Mikey Bussing

## 2019-07-16 LAB — MICROALBUMIN / CREATININE URINE RATIO
Creatinine, Urine: 183.6 mg/dL
Microalb/Creat Ratio: 279 mg/g creat — ABNORMAL HIGH (ref 0–29)
Microalbumin, Urine: 512.3 ug/mL

## 2019-07-17 ENCOUNTER — Other Ambulatory Visit: Payer: Self-pay | Admitting: Internal Medicine

## 2019-07-17 DIAGNOSIS — E119 Type 2 diabetes mellitus without complications: Secondary | ICD-10-CM

## 2019-07-17 DIAGNOSIS — G44209 Tension-type headache, unspecified, not intractable: Secondary | ICD-10-CM

## 2019-07-17 DIAGNOSIS — I639 Cerebral infarction, unspecified: Secondary | ICD-10-CM

## 2019-07-17 DIAGNOSIS — Z8673 Personal history of transient ischemic attack (TIA), and cerebral infarction without residual deficits: Secondary | ICD-10-CM

## 2019-07-17 NOTE — Telephone Encounter (Signed)
Refill Request    aspirin EC 325 MG tablet atorvastatin (LIPITOR) 80 MG tablet celecoxib (CELEBREX) 200 MG capsule Empagliflozin-metFORMIN HCl (SYNJARDY) 06-998 MG TABS  [id number] - [place], [place] - [address] AT [facility] [address]

## 2019-07-18 NOTE — Progress Notes (Signed)
Internal Medicine Clinic Attending  Case discussed with Dr. Chundi at the time of the visit.  We reviewed the resident's history and exam and pertinent patient test results.  I agree with the assessment, diagnosis, and plan of care documented in the resident's note. 

## 2019-07-21 ENCOUNTER — Ambulatory Visit (INDEPENDENT_AMBULATORY_CARE_PROVIDER_SITE_OTHER): Payer: 59 | Admitting: *Deleted

## 2019-07-21 DIAGNOSIS — I639 Cerebral infarction, unspecified: Secondary | ICD-10-CM | POA: Diagnosis not present

## 2019-07-21 LAB — CUP PACEART REMOTE DEVICE CHECK
Date Time Interrogation Session: 20210613234656
Implantable Pulse Generator Implant Date: 20190123

## 2019-07-21 MED ORDER — ATORVASTATIN CALCIUM 80 MG PO TABS: 80.0000 mg | ORAL_TABLET | Freq: Every day | ORAL | 1 refills | Status: DC

## 2019-07-21 MED ORDER — CELECOXIB 200 MG PO CAPS: 200.0000 mg | ORAL_CAPSULE | Freq: Every day | ORAL | 0 refills | Status: DC | PRN

## 2019-07-21 MED ORDER — SYNJARDY 5-1000 MG PO TABS: 1.0000 | ORAL_TABLET | Freq: Two times a day (BID) | ORAL | 1 refills | Status: DC

## 2019-07-21 MED ORDER — ASPIRIN EC 325 MG PO TBEC: 325.0000 mg | DELAYED_RELEASE_TABLET | Freq: Every day | ORAL | 0 refills | Status: DC

## 2019-07-21 NOTE — Progress Notes (Signed)
Carelink Summary Report / Loop Recorder 

## 2019-07-25 ENCOUNTER — Encounter: Payer: Self-pay | Admitting: *Deleted

## 2019-08-14 ENCOUNTER — Ambulatory Visit: Payer: 59 | Admitting: Student

## 2019-08-14 ENCOUNTER — Encounter: Payer: Self-pay | Admitting: Student

## 2019-08-14 ENCOUNTER — Other Ambulatory Visit: Payer: Self-pay

## 2019-08-14 VITALS — BP 154/83 | HR 63 | Wt 158.7 lb

## 2019-08-14 DIAGNOSIS — E1169 Type 2 diabetes mellitus with other specified complication: Secondary | ICD-10-CM

## 2019-08-14 DIAGNOSIS — E139 Other specified diabetes mellitus without complications: Secondary | ICD-10-CM

## 2019-08-14 DIAGNOSIS — I639 Cerebral infarction, unspecified: Secondary | ICD-10-CM

## 2019-08-14 DIAGNOSIS — Z8673 Personal history of transient ischemic attack (TIA), and cerebral infarction without residual deficits: Secondary | ICD-10-CM

## 2019-08-14 DIAGNOSIS — E119 Type 2 diabetes mellitus without complications: Secondary | ICD-10-CM

## 2019-08-14 DIAGNOSIS — R808 Other proteinuria: Secondary | ICD-10-CM | POA: Diagnosis not present

## 2019-08-14 DIAGNOSIS — G44209 Tension-type headache, unspecified, not intractable: Secondary | ICD-10-CM | POA: Diagnosis not present

## 2019-08-14 DIAGNOSIS — I10 Essential (primary) hypertension: Secondary | ICD-10-CM | POA: Diagnosis not present

## 2019-08-14 LAB — GLUCOSE, CAPILLARY: Glucose-Capillary: 114 mg/dL — ABNORMAL HIGH (ref 70–99)

## 2019-08-14 MED ORDER — ASPIRIN EC 325 MG PO TBEC: 325.0000 mg | DELAYED_RELEASE_TABLET | Freq: Every day | ORAL | 0 refills | Status: DC

## 2019-08-14 MED ORDER — LISINOPRIL 20 MG PO TABS: 20.0000 mg | ORAL_TABLET | Freq: Every day | ORAL | 11 refills | Status: DC

## 2019-08-14 MED ORDER — CYCLOBENZAPRINE HCL 5 MG PO TABS: 5.0000 mg | ORAL_TABLET | Freq: Three times a day (TID) | ORAL | 0 refills | Status: DC | PRN

## 2019-08-14 MED ORDER — SYNJARDY 5-1000 MG PO TABS: 1.0000 | ORAL_TABLET | Freq: Two times a day (BID) | ORAL | 1 refills | Status: DC

## 2019-08-14 MED ORDER — ATORVASTATIN CALCIUM 80 MG PO TABS: 80.0000 mg | ORAL_TABLET | Freq: Every day | ORAL | 1 refills | Status: DC

## 2019-08-14 MED FILL — CYCLOBENZAPRINE HCL 5 MG TA: 5 | 10 days supply | Qty: 30 | Fill #0

## 2019-08-14 MED FILL — ATORVASTATIN 80 MG TABLET: 80 | 30 days supply | Qty: 30 | Fill #0

## 2019-08-14 MED FILL — LISINOPRIL 20 MG TABLET: 20 | 30 days supply | Qty: 30 | Fill #0

## 2019-08-14 NOTE — Assessment & Plan Note (Addendum)
Patient BP 154/83 today with moderate microalbuminuria on labs from 07/15/2019. She denies chest pain, lightheadedness, visual changes, fatigue, or diaphoresis. Plan to start her on Lisinopril 20 mg daily.   Plan -Start lisinopril 20 mg daily -F/u BMP in 4 weeks

## 2019-08-14 NOTE — Assessment & Plan Note (Signed)
Patient with history of right MCA stroke and on atorvastatin 80 mg and aspirin 325 mg. She states she has run out of atorvastatin and aspirin, and wishes to use [facility] outpatient pharmacy. Medication sent to outpatient pharmacy. Patient has not been able to follow up with neurology, but states she will try to make an appointment.  Plan -Continue atorvastatin 80 mg daily -Continue Asprin 325 mg daily -Follow up with neurology

## 2019-08-14 NOTE — Progress Notes (Signed)
   CC: Headache  HPI:  [name] is a 54 y.o. with past medical history listed below who presents for headache.Please refer to problem based charting for further details and assessment and plan of current problem and chronic medical conditions.  Past Medical History:  Diagnosis Date  . 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 CVA (cerebral vascular accident) (HCC)   . Left hemiplegia (HCC)   . Left wrist fracture    Review of Systems:  Negative as per HPI  Physical Exam:  Vitals:   08/14/19 1507 08/14/19 1549  BP: (!) 157/105 (!) 154/83  Pulse: 74 63  SpO2: 99%   Weight: 158 lb 11.2 oz (72 kg)    Physical Exam  Constitutional: Appears well-developed and well-nourished. No distress.  HENT:  Head: Normocephalic. No signs of bruising or swelling on the scalp. Pain with palpation at base of skull. Eyes: Conjunctivae are normal.  Cardiovascular: Normal rate, regular rhythm and normal heart sounds.  Respiratory: Effort normal and breath sounds normal. No respiratory distress. No wheezes.  GI: Soft. Bowel sounds are normal. No distension. There is no tenderness.  Musculoskeletal: No edema.  Neurological: left hand grip 4/5 strength Skin: Not diaphoretic. No erythema. Small 3 cm area of hyperpigmentation between shoulder blades. no bruising. Psychiatric:Anxious  Assessment & Plan:   See Encounters Tab for problem based charting.  Patient seen with Dr. Oswaldo Done
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
## 2019-08-14 NOTE — Patient Instructions (Addendum)
It was a pleasure seeing you in clinic. Today we discussed:   Headache: I have given you a new medicine to help with your headache which you can take when you need  Diabetes: I have reordered your medication, and we checked your blood sugar today  High blood pressure: You blood pressure has been a little high, please start taking lisinopril 20 mg for blood pressure.   Follow up in 1 month   If you have any questions or concerns, please call our clinic at [phone] between 9am-5pm and after hours call [phone] and ask for the internal medicine resident on call. If you feel you are having a medical emergency please call 911.   Thank you, we look forward to helping you remain healthy!

## 2019-08-14 NOTE — Assessment & Plan Note (Addendum)
Patient with elevated microalbuminuria/cr ratio of 294, and is hypertensive with BP 154/83. Will start her on ACEi today. Last A1c 9.0 on 6/82021 and was started on Synjardy 5-1000mg  BID. Patient states medication was not available at pharmacy in [place] and has not taken any medication for diabetes. She request medication to be sent to [facility]. I have resent her medications and encouraged her to take the medications.   Plan -Start Synjardy 06-998 mg BID -Start Lisinopril 20 mg QD

## 2019-08-15 LAB — BMP8+ANION GAP
Anion Gap: 13 mmol/L (ref 10.0–18.0)
BUN/Creatinine Ratio: 16 (ref 9–23)
BUN: 12 mg/dL (ref 6–24)
CO2: 22 mmol/L (ref 20–29)
Calcium: 9.7 mg/dL (ref 8.7–10.2)
Chloride: 105 mmol/L (ref 96–106)
Creatinine, Ser: 0.75 mg/dL (ref 0.57–1.00)
GFR calc Af Amer: 105 mL/min/{1.73_m2} (ref >59)
GFR calc non Af Amer: 91 mL/min/{1.73_m2} (ref >59)
Glucose: 119 mg/dL — ABNORMAL HIGH (ref 65–99)
Potassium: 4.1 mmol/L (ref 3.5–5.2)
Sodium: 140 mmol/L (ref 134–144)

## 2019-08-15 NOTE — Progress Notes (Signed)
Internal Medicine Clinic Attending  I saw and evaluated the patient.  I personally confirmed the key portions of the history and exam documented by Dr. Liang and I reviewed pertinent patient test results.  The assessment, diagnosis, and plan were formulated together and I agree with the documentation in the resident's note.  

## 2019-08-20 ENCOUNTER — Telehealth: Payer: Self-pay | Admitting: *Deleted

## 2019-08-20 NOTE — Telephone Encounter (Signed)
Interpreter calls for pt and states that pt has not gotten some of her medicine and then she has some, she does not know what she has and what she doesn't have. I [facility] and spoke to pharmacist [facility], she states that the system says she has picked up the synjardy somewhere but it does not say where. Pt states she does not think she has it. It is decided pt will have appt to see md again and bring all her medicine, at that time we will check prices and determine what pt has and doesn't have and what is out of range cost wise. appt for Monday 7/19 per hme.

## 2019-08-25 ENCOUNTER — Ambulatory Visit (INDEPENDENT_AMBULATORY_CARE_PROVIDER_SITE_OTHER): Payer: 59 | Admitting: *Deleted

## 2019-08-25 ENCOUNTER — Other Ambulatory Visit: Payer: Self-pay

## 2019-08-25 ENCOUNTER — Encounter: Payer: Self-pay | Admitting: Internal Medicine

## 2019-08-25 ENCOUNTER — Ambulatory Visit: Payer: 59 | Admitting: Dietician

## 2019-08-25 ENCOUNTER — Other Ambulatory Visit: Payer: Self-pay | Admitting: Internal Medicine

## 2019-08-25 ENCOUNTER — Encounter: Payer: Self-pay | Admitting: Dietician

## 2019-08-25 ENCOUNTER — Ambulatory Visit (INDEPENDENT_AMBULATORY_CARE_PROVIDER_SITE_OTHER): Payer: 59 | Admitting: Internal Medicine

## 2019-08-25 ENCOUNTER — Encounter: Payer: 59 | Admitting: Internal Medicine

## 2019-08-25 DIAGNOSIS — Z8673 Personal history of transient ischemic attack (TIA), and cerebral infarction without residual deficits: Secondary | ICD-10-CM

## 2019-08-25 DIAGNOSIS — E119 Type 2 diabetes mellitus without complications: Secondary | ICD-10-CM | POA: Diagnosis not present

## 2019-08-25 DIAGNOSIS — G44209 Tension-type headache, unspecified, not intractable: Secondary | ICD-10-CM

## 2019-08-25 LAB — CUP PACEART REMOTE DEVICE CHECK
Date Time Interrogation Session: 20210718231639
Implantable Pulse Generator Implant Date: 20190123

## 2019-08-25 LAB — HM DIABETES EYE EXAM

## 2019-08-25 MED ORDER — METFORMIN HCL 500 MG PO TABS: 500.0000 mg | ORAL_TABLET | Freq: Every day | ORAL | 11 refills | Status: DC

## 2019-08-25 MED FILL — METFORMIN HCL 500 MG TABS: 500 | 30 days supply | Qty: 30 | Fill #0

## 2019-08-25 NOTE — Patient Instructions (Signed)
Ms. Klatt, It was nice meeting you!   Today we discussed your diabetes medication.   Stop Synjardy and start taking Metformin. You will slowly increase to the desired dose each week. Increase by 500 mg weekly until you get to 1000 mg twice daily.   Take care, Dr. Chesley Mires

## 2019-08-25 NOTE — Progress Notes (Signed)
Retinal images were done today as part of her yearly diabetes health maintenance program. They were transmitted electronically to an ophthalmologist who will interpret them and send and report back with their findings. This was explained to the patient and they were also informed that the results would be communicated to them either by phone,  mail or both.

## 2019-08-26 ENCOUNTER — Encounter: Payer: Self-pay | Admitting: Internal Medicine

## 2019-08-26 NOTE — Assessment & Plan Note (Addendum)
Patient presents due to medication intolerance. She was prescribed Synjardy 06-998 mg BID, but only took it for 3 days due to GI upset. Suspect this is primarily due to taking full dose Metformin after being off of it for a while. Will stop Synjardy and start Metformin 500 mg daily with instructions on how to up-titrate each week to get to target dose of 1000 mg BID. Language barrier does make this difficult, but husband was also present and seems to be able to read English a little better. In-person translator was used. Both patient and husband endorsed understanding, but she would certainly benefit from closer follow-up. PCP has scheduled to see her back in 3 weeks.   Tachycardia noted, likely from vitals being taken as soon as patient arrived with adequate rest. She left prior to obtaining repeat, but will monitor at close follow-up visit.

## 2019-08-26 NOTE — Progress Notes (Signed)
Carelink Summary Report / Loop Recorder 

## 2019-08-26 NOTE — Progress Notes (Signed)
Acute Office Visit  Subjective:    Patient ID: Kristen Short, female    DOB: November 18, 1965, 54 y.o.   MRN: [id number]  Chief complaint: medication concern   HPI Patient is in today for medication concern regarding type II DM. Please see problem based charting for further details.   Past Medical History:  Diagnosis Date  . 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 CVA (cerebral vascular accident) (HCC)   . Left hemiplegia (HCC)   . Left wrist fracture     Past Surgical History:  Procedure Laterality Date  . LOOP RECORDER INSERTION N/A 03/05/2017   Procedure: LOOP RECORDER INSERTION;  Surgeon: Marinus Maw, MD;  Location: [facility] INVASIVE CV LAB;  Service: Cardiovascular;  Laterality: N/A;  . TEE WITHOUT CARDIOVERSION N/A 03/05/2017   Procedure: TRANSESOPHAGEAL ECHOCARDIOGRAM (TEE);  Surgeon: Wendall Stade, MD;  Location: [facility] ENDOSCOPY;  Service: Cardiovascular;  Laterality: N/A;    Family History  Problem Relation Age of Onset  . High blood pressure Sister   . Heart Problems Sister        angina due to vasospasms  . Stroke Sister   . Stroke Father   . Stroke Maternal Grandmother     Social History   Socioeconomic History  . Marital status: Married    Spouse name: Not on file  . Number of children: Not on file  . Years of education: Not on file  . Highest education level: Not on file  Occupational History  . Not on file  Tobacco Use  . Smoking status: Never Smoker  . Smokeless tobacco: Never Used  Substance and Sexual Activity  . Alcohol use: No  . Drug use: No  . Sexual activity: Yes  Other Topics Concern  . Not on file  Social History Narrative  . Not on file   Social Determinants of Health   Financial Resource Strain:   . Difficulty of Paying Living Expenses:   Food Insecurity:   . Worried About Programme researcher, broadcasting/film/video in the Last Year:   . Barista in the Last Year:   Transportation Needs:   . Freight forwarder (Medical):   Marland Kitchen Lack of Transportation  (Non-Medical):   Physical Activity:   . Days of Exercise per Week:   . Minutes of Exercise per Session:   Stress:   . Feeling of Stress :   Social Connections:   . Frequency of Communication with Friends and Family:   . Frequency of Social Gatherings with Friends and Family:   . Attends Religious Services:   . Active Member of Clubs or Organizations:   . Attends Banker Meetings:   Marland Kitchen Marital Status:   Intimate Partner Violence:   . Fear of Current or Ex-Partner:   . Emotionally Abused:   Marland Kitchen Physically Abused:   . Sexually Abused:     Outpatient Medications Prior to Visit  Medication Sig Dispense Refill  . aspirin EC 325 MG tablet Take 1 tablet (325 mg total) by mouth daily. 90 tablet 0  . atorvastatin (LIPITOR) 80 MG tablet Take 1 tablet (80 mg total) by mouth daily. 90 tablet 1  . cyclobenzaprine (FLEXERIL) 5 MG tablet Take 1 tablet (5 mg total) by mouth 3 (three) times daily as needed for muscle spasms. 30 tablet 0  . lisinopril (ZESTRIL) 20 MG tablet Take 1 tablet (20 mg total) by mouth daily. 30 tablet 11  . Empagliflozin-metFORMIN HCl (SYNJARDY) 06-998 MG TABS Take 1 tablet by  mouth 2 (two) times daily. 180 tablet 1   No facility-administered medications prior to visit.    Allergies  Allergen Reactions  . Synjardy [Empagliflozin-Metformin Hcl] Other (See Comments)    Abdominal pain Palpitations     Review of Systems  Constitutional: Negative for chills and fever.  Respiratory: Negative for shortness of breath.   Cardiovascular: Negative for chest pain.  Gastrointestinal: Positive for nausea.  Skin: Negative for rash.  Neurological: Negative for weakness, numbness and headaches.       Objective:    Physical Exam Constitutional:      General: She is not in acute distress.    Appearance: Normal appearance.  Cardiovascular:     Rate and Rhythm: Normal rate and regular rhythm.  Pulmonary:     Effort: Pulmonary effort is normal.     Breath sounds:  Normal breath sounds.  Abdominal:     General: There is no distension.     Palpations: Abdomen is soft.     Tenderness: There is no abdominal tenderness.  Musculoskeletal:     Right lower leg: No edema.     Left lower leg: No edema.  Neurological:     General: No focal deficit present.     Mental Status: She is alert and oriented to person, place, and time.     BP (!) 142/81 (BP Location: Left Arm, Patient Position: Sitting, Cuff Size: Normal)   Pulse (!) 102   Temp 98.2 F (36.8 C) (Oral)   Wt 153 lb 3.2 oz (69.5 kg)   LMP 06/25/2018   SpO2 100% Comment: room air  BMI 29.92 kg/m  Wt Readings from Last 3 Encounters:  08/25/19 153 lb 3.2 oz (69.5 kg)  08/14/19 158 lb 11.2 oz (72 kg)  07/15/19 163 lb 11.2 oz (74.3 kg)    Health Maintenance Due  Topic Date Due  . Hepatitis C Screening  Never done  . PNEUMOCOCCAL POLYSACCHARIDE VACCINE AGE 51-64 HIGH RISK  Never done  . FOOT EXAM  Never done  . OPHTHALMOLOGY EXAM  Never done  . COVID-19 Vaccine (1) Never done  . TETANUS/TDAP  Never done  . PAP SMEAR-Modifier  Never done  . MAMMOGRAM  Never done  . COLONOSCOPY  Never done    There are no preventive care reminders to display for this patient.   No results found for: TSH Lab Results  Component Value Date   WBC 7.0 03/26/2019   HGB 13.9 03/26/2019   HCT 46.7 (H) 03/26/2019   MCV 81.1 03/26/2019   PLT 211 03/26/2019   Lab Results  Component Value Date   NA 140 08/14/2019   K 4.1 08/14/2019   CO2 22 08/14/2019   GLUCOSE 119 (H) 08/14/2019   BUN 12 08/14/2019   CREATININE 0.75 08/14/2019   BILITOT 0.5 03/06/2017   ALKPHOS 69 03/06/2017   AST 27 03/06/2017   ALT 19 03/06/2017   PROT 7.7 03/06/2017   ALBUMIN 3.5 03/06/2017   CALCIUM 9.7 08/14/2019   ANIONGAP 11 03/26/2019   Lab Results  Component Value Date   CHOL 236 (H) 03/01/2017   Lab Results  Component Value Date   HDL 36 (L) 03/01/2017   Lab Results  Component Value Date   LDLCALC 173 (H)  03/01/2017   Lab Results  Component Value Date   TRIG 135 03/01/2017   Lab Results  Component Value Date   CHOLHDL 6.6 03/01/2017   Lab Results  Component Value Date   HGBA1C 9.0 (A)  07/15/2019       Assessment & Plan:   Problem List Items Addressed This Visit      Endocrine   Diabetes (HCC) (Chronic)    Patient presents due to medication intolerance. She was prescribed Synjardy 06-998 mg BID, but only took it for 3 days due to GI upset. Suspect this is primarily due to taking full dose Metformin after being off of it for a while. Will stop Synjardy and start Metformin 500 mg daily with instructions on how to up-titrate each week to get to target dose of 1000 mg BID. Language barrier does make this difficult, but husband was also present and seems to be able to read English a little better. In-person translator was used. Both patient and husband endorsed understanding, but she would certainly benefit from closer follow-up. PCP has scheduled to see her back in 3 weeks.   Tachycardia noted, likely from vitals being taken as soon as patient arrived with adequate rest. She left prior to obtaining repeat, but will monitor at close follow-up visit.        Relevant Medications   metFORMIN (GLUCOPHAGE) 500 MG tablet       Meds ordered this encounter  Medications  . metFORMIN (GLUCOPHAGE) 500 MG tablet    Sig: Take 1 tablet (500 mg total) by mouth daily with breakfast.    Dispense:  30 tablet    Refill:  11     Michell Giuliano D Zebulan Hinshaw, DO

## 2019-08-26 NOTE — Progress Notes (Signed)
Internal Medicine Clinic Attending  Case discussed with Dr. Bloomfield  At the time of the visit.  We reviewed the resident's history and exam and pertinent patient test results.  I agree with the assessment, diagnosis, and plan of care documented in the resident's note.  

## 2019-08-29 ENCOUNTER — Encounter: Payer: Self-pay | Admitting: Dietician

## 2019-08-29 NOTE — Progress Notes (Signed)
Retinal image report received from Dr. Nicholaus Corolla at [facility].

## 2019-09-17 NOTE — Addendum Note (Signed)
Addended by: Bufford Spikes on: 09/17/2019 10:50 AM   Modules accepted: Orders

## 2019-09-29 ENCOUNTER — Ambulatory Visit (INDEPENDENT_AMBULATORY_CARE_PROVIDER_SITE_OTHER): Payer: 59 | Admitting: *Deleted

## 2019-09-29 DIAGNOSIS — I639 Cerebral infarction, unspecified: Secondary | ICD-10-CM

## 2019-09-29 LAB — CUP PACEART REMOTE DEVICE CHECK
Date Time Interrogation Session: 20210820231829
Implantable Pulse Generator Implant Date: 20190123

## 2019-10-02 NOTE — Progress Notes (Signed)
Carelink Summary Report / Loop Recorder 

## 2019-10-14 ENCOUNTER — Other Ambulatory Visit: Payer: Self-pay

## 2019-10-14 ENCOUNTER — Other Ambulatory Visit: Payer: Self-pay | Admitting: Student

## 2019-10-14 ENCOUNTER — Ambulatory Visit (INDEPENDENT_AMBULATORY_CARE_PROVIDER_SITE_OTHER): Payer: 59 | Admitting: Student

## 2019-10-14 ENCOUNTER — Encounter: Payer: Self-pay | Admitting: Student

## 2019-10-14 VITALS — BP 135/90 | HR 72 | Temp 98.0°F | Ht 60.5 in | Wt 157.2 lb

## 2019-10-14 DIAGNOSIS — G43809 Other migraine, not intractable, without status migrainosus: Secondary | ICD-10-CM

## 2019-10-14 DIAGNOSIS — E785 Hyperlipidemia, unspecified: Secondary | ICD-10-CM

## 2019-10-14 DIAGNOSIS — E1169 Type 2 diabetes mellitus with other specified complication: Secondary | ICD-10-CM | POA: Diagnosis not present

## 2019-10-14 DIAGNOSIS — I10 Essential (primary) hypertension: Secondary | ICD-10-CM | POA: Diagnosis not present

## 2019-10-14 DIAGNOSIS — E119 Type 2 diabetes mellitus without complications: Secondary | ICD-10-CM

## 2019-10-14 DIAGNOSIS — Z8673 Personal history of transient ischemic attack (TIA), and cerebral infarction without residual deficits: Secondary | ICD-10-CM

## 2019-10-14 DIAGNOSIS — I639 Cerebral infarction, unspecified: Secondary | ICD-10-CM

## 2019-10-14 DIAGNOSIS — Z9889 Other specified postprocedural states: Secondary | ICD-10-CM

## 2019-10-14 DIAGNOSIS — Z630 Problems in relationship with spouse or partner: Secondary | ICD-10-CM | POA: Insufficient documentation

## 2019-10-14 DIAGNOSIS — M316 Other giant cell arteritis: Secondary | ICD-10-CM

## 2019-10-14 DIAGNOSIS — G43009 Migraine without aura, not intractable, without status migrainosus: Secondary | ICD-10-CM

## 2019-10-14 DIAGNOSIS — G44209 Tension-type headache, unspecified, not intractable: Secondary | ICD-10-CM

## 2019-10-14 LAB — POCT GLYCOSYLATED HEMOGLOBIN (HGB A1C): Hemoglobin A1C: 8 % — AB (ref 4.0–5.6)

## 2019-10-14 LAB — GLUCOSE, CAPILLARY: Glucose-Capillary: 178 mg/dL — ABNORMAL HIGH (ref 70–99)

## 2019-10-14 MED ORDER — METFORMIN HCL 500 MG PO TABS: 500.0000 mg | ORAL_TABLET | Freq: Every day | ORAL | 11 refills | Status: DC

## 2019-10-14 MED ORDER — ATORVASTATIN CALCIUM 80 MG PO TABS: 80.0000 mg | ORAL_TABLET | Freq: Every day | ORAL | 1 refills | Status: DC

## 2019-10-14 MED ORDER — LISINOPRIL 20 MG PO TABS: 20.0000 mg | ORAL_TABLET | Freq: Every day | ORAL | 11 refills | Status: DC

## 2019-10-14 MED ORDER — AMITRIPTYLINE HCL 10 MG PO TABS: 10.0000 mg | ORAL_TABLET | Freq: Every day | ORAL | 3 refills | Status: DC

## 2019-10-14 MED ORDER — CYCLOBENZAPRINE HCL 5 MG PO TABS: 5.0000 mg | ORAL_TABLET | Freq: Three times a day (TID) | ORAL | 0 refills | Status: AC | PRN

## 2019-10-14 MED ORDER — ASPIRIN EC 325 MG PO TBEC: 325.0000 mg | DELAYED_RELEASE_TABLET | Freq: Every day | ORAL | 0 refills | Status: AC

## 2019-10-14 MED FILL — ATORVASTATIN 80 MG TABLET: 80 | 30 days supply | Qty: 30 | Fill #0

## 2019-10-14 MED FILL — LISINOPRIL 20 MG TABLET: 20 | 30 days supply | Qty: 30 | Fill #0

## 2019-10-14 MED FILL — CYCLOBENZAPRINE HCL 5 MG TA: 5 | 10 days supply | Qty: 30 | Fill #0

## 2019-10-14 MED FILL — METFORMIN HCL 500 MG TABS: 500 | 30 days supply | Qty: 30 | Fill #0

## 2019-10-14 MED FILL — AMITRIPTYLINE HCL 10 MG TAB: 10 | 30 days supply | Qty: 30 | Fill #0
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
## 2019-10-14 NOTE — Assessment & Plan Note (Signed)
Patient had loop recorder placed 03/05/17. Today she reports L-sided chest wall tenderness which she attributes to the loop recorder. On exam, she endorses tenderness with palpation of L side of her chest wall. No overlying bruising or erythema. Heart sounds are normal. Per chart review, patient has not followed up with cardiology since placement, though no arrhythmias have been detected to date.  Plan: - referral placed for cardiology follow-up to discuss possible removal of loop recorder vs leaving in place

## 2019-10-14 NOTE — Assessment & Plan Note (Signed)
Patient reports she has been out of her medications since July, including her metformin. Last visit (08/26/19) she was started on metformin 500 mg daily with instructions to up-titrate each week to get to a target dose of 1000 mg BID. Unclear if she ever uptitrated this dose.  A1c today 8.0 from 9.0 3 months ago.   Plan - refilled metformin 500 mg daily - repeat A1c in 3 months (December 2021)

## 2019-10-14 NOTE — Addendum Note (Signed)
Addended by: Alphonzo Severance on: 10/14/2019 02:54 PM   Modules accepted: Orders

## 2019-10-14 NOTE — Assessment & Plan Note (Signed)
BP 135/90 today. Patient prescribed lisinopril 20 mg daily back on 08/14/19. Reports she has been out of this prescription since July. BMP from 08/14/19: sCr 0.75 with K of 4.1. - Refilled lisinopril 20 mg daily - f/u BMP at next visit
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
## 2019-10-14 NOTE — Assessment & Plan Note (Signed)
Patient reports she has been out of all of her medications since July, so did not take any medications for the month of August through today (10/14/19). This includes her secondary prevention stroke medications aspirin 325 mg and atorvastatin 80 mg daily. She has not been able to make it to neurology follow-up appointments. Per her husband, since he is the sole financial provider for the family and transportation to medical appointments, he is unable to leave work for these additional appointments.   Plan - Refilled atorvastatin 80 mg daily - Continue aspirin 325 mg daily - Keep trying for neurology follow-up. Did not place referral today due to difficulty making it to appointments, however will revisit this.

## 2019-10-15 LAB — SEDIMENTATION RATE: Sed Rate: 47 mm/hr — ABNORMAL HIGH (ref 0–40)

## 2019-10-15 MED ORDER — PREDNISONE 20 MG PO TABS: 60.0000 mg | ORAL_TABLET | Freq: Every day | ORAL | 0 refills | Status: AC

## 2019-10-15 NOTE — Addendum Note (Signed)
Addended by: Alphonzo Severance on: 10/15/2019 03:28 PM   Modules accepted: Orders

## 2019-10-15 NOTE — Progress Notes (Signed)
Internal Medicine Clinic Attending  I saw and evaluated the patient.  I personally confirmed the key portions of the history and exam documented by Dr. Claudette Laws and I reviewed pertinent patient test results.  The assessment, diagnosis, and plan were formulated together and I agree with the documentation in the resident's note.   Had a long discussion with patient about concerns at home.  Will have social work follow up.  We are concerned for an organic issue with her headaches and will further look in to this.  Translator present to help with communication.

## 2019-10-21 NOTE — Addendum Note (Signed)
Addended by: Alphonzo Severance on: 10/21/2019 04:39 PM   Modules accepted: Orders

## 2019-10-21 NOTE — Addendum Note (Signed)
Addended by: Neomia Dear on: 10/21/2019 04:34 PM   Modules accepted: Orders

## 2019-10-24 ENCOUNTER — Ambulatory Visit: Payer: 59

## 2019-10-24 DIAGNOSIS — Z630 Problems in relationship with spouse or partner: Secondary | ICD-10-CM

## 2019-10-24 DIAGNOSIS — E785 Hyperlipidemia, unspecified: Secondary | ICD-10-CM

## 2019-10-24 DIAGNOSIS — I10 Essential (primary) hypertension: Secondary | ICD-10-CM

## 2019-10-24 NOTE — Progress Notes (Signed)
Internal Medicine Clinic Resident  I have personally reviewed this encounter including the documentation in this note and/or discussed this patient with the care management provider. I will address any urgent items identified by the care management provider and will communicate my actions to the patient's PCP. I have reviewed the patient's CCM visit with my supervising attending, Dr Hoffman.  Matt Bianca Raneri, MD 10/24/2019   
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
## 2019-10-29 ENCOUNTER — Other Ambulatory Visit: Payer: Self-pay

## 2019-10-29 ENCOUNTER — Ambulatory Visit (INDEPENDENT_AMBULATORY_CARE_PROVIDER_SITE_OTHER): Payer: 59 | Admitting: Student

## 2019-10-29 ENCOUNTER — Encounter: Payer: Self-pay | Admitting: Student

## 2019-10-29 VITALS — BP 136/83 | HR 80 | Temp 98.0°F | Ht 66.0 in | Wt 154.2 lb

## 2019-10-29 DIAGNOSIS — I10 Essential (primary) hypertension: Secondary | ICD-10-CM | POA: Diagnosis not present

## 2019-10-29 DIAGNOSIS — Z9889 Other specified postprocedural states: Secondary | ICD-10-CM

## 2019-10-29 DIAGNOSIS — Z630 Problems in relationship with spouse or partner: Secondary | ICD-10-CM

## 2019-10-29 DIAGNOSIS — G43009 Migraine without aura, not intractable, without status migrainosus: Secondary | ICD-10-CM | POA: Diagnosis not present
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
## 2019-10-29 NOTE — Assessment & Plan Note (Signed)
BP 136/83 today. Patient reports she has continued on lisinopril 20 mg, though she did not bring her medications with her and did request refills of medications which I refilled at last visit.  - lisinopril 20 mg daily - f/u BMP at next visit (not obtained at this visit as this was lower priority)

## 2019-10-29 NOTE — Assessment & Plan Note (Signed)
Patient does not endorse L-sided chest wall tenderness today, though she does ask about cardiology follow-up.   - Instructed patient to call her cardiologist (number provided) to schedule follow-up

## 2019-10-29 NOTE — Patient Instructions (Addendum)
Kristen Short,   Thank you for your visit to the [facility] today. It was a pleasure seeing you again. Today we discussed the following:  1) Headaches - We ordered a CT of your head. Someone will call you to schedule this. - Please give our clinic a call at the number below after you have had the CT scan of your head. At that point, we will schedule a telehealth visit to see how you are doing. - We are also referring you to vascular surgery for a temporal artery biopsy. See below for more information.   2) Loop recorder - Please call your cardiologist at [phone] to schedule an appointment to discuss the device in your chest that is monitoring your heart.   We would like to schedule a telehealth visit after you have gotten your head CT. You will also need an in-person visit in ~1 month to discuss your other conditions as well as healthcare maintenance (vaccinations, cancer screenings). Please bring ALL of your medications with you.   If you have any questions or concerns, please call our clinic at [phone] between 9am-5pm. Outside of these hours, call [phone] and ask for the internal medicine resident on call. If you feel you are having a medical emergency please call 911.   Kristen Short,  C?m ?n b?n ? ??n th?m khm t?i Phng khm N?i Mng M?t ngy hm nay. R?t vui khi g?p l?i b?n. Hm nay chng ta ? th?o lu?n v? nh?ng ?i?u sau:  1) Nh?c ??u - Chng ti ? yu c?u ch?p CT ??u c?a anh. Ai ? s? g?i cho b?n ?? s?p x?p vi?c ny. - Vui lng g?i cho phng khm c?a chng ti theo s? ?i?n tho?i bn d??i sau khi b?n ? ch?p CT ??u. T?i th?i ?i?m ?, chng ti s? ln l?ch th?m khm t? xa ?? xem tnh hnh c?a b?n nh? th? no. - Chng ti c?ng ?ang gi?i thi?u b?n ??n ph?u thu?t m?ch mu ?? sinh thi?t ??ng m?ch thi d??ng. Xem bn d??i ?? bi?t thm thng tin.   2) My ghi vng l?p - Vui lng g?i cho bc s? tim m?ch c?a b?n theo s? [phone] ?? ln l?ch h?n th?o  lu?n v? thi?t b? trong l?ng ng?c ?ang theo di tim c?a b?n.   Chng ti mu?n ln l?ch th?m khm t? xa sau khi b?n nh?n ???c CT ??u. B?n c?ng s? c?n ??n g?p tr?c ti?p trong ~ 1 thng ?? th?o lu?n v? cc tnh tr?ng khc c?a b?n c?ng nh? vi?c duy tr ch?m [name] s?c kh?e (tim ch?ng, t?m sot ung th?). Vui lng mang theo T?T C? cc lo?i thu?c c?a b?n.   N?u b?n c b?t k? cu h?i ho?c th?c m?c no, vui lng g?i cho phng khm c?a chng ti theo s? [phone] t? 9 gi? sng ??n 5 gi? chi?u. Ngoi nh?ng gi? ny, hy g?i s? [phone] v yu c?u bc s? n?i khoa theo cu?c g?i. N?u b?n c?m th?y mnh ?ang g?p tr??ng h?p kh?n c?p v? y t?, vui lng g?i 911.   Temporal Artery Biopsy  Arteries are blood vessels that carry blood from the heart to the rest of the body. Temporal arteries are found on the side of the head and between the ears and eyes. During a temporal artery biopsy, a sample of the temporal artery is removed and examined under a microscope. This procedure may be done to see if you have  a condition that causes your arteries to become swollen or inflamed (temporal arteritis or giant cell arteritis). Tell a health care provider about:  Any allergies you have.  All medicines you are taking, including vitamins, herbs, eye drops, creams, and over-the-counter medicines.  Any problems you or family members have had with anesthetic medicines.  Any blood disorders you have.  Any surgeries you have had.  Any medical conditions you have or have had.  Whether you are pregnant or may be pregnant. What are the risks? Generally, this is a safe procedure. However, problems may occur, including:  Bleeding.  A collection of blood under the skin (hematoma).  Infection.  Nerve damage in the temples. This can cause numbness or make the muscles in your face weak.  Scarring. On the scalp, hair may not grow around the scar. What happens before the procedure?  You may have blood tests to make sure  that your blood clots normally.  Ask your health care provider about: ? Changing or stopping your regular medicines. This is especially important if you are taking diabetes medicines or blood thinners. ? Taking medicines such as aspirin and ibuprofen. These medicines can thin your blood. Do not take these medicines unless your health care provider tells you to take them. ? Taking over-the-counter medicines, vitamins, herbs, and supplements.  Follow instructions from your health care provider about eating or drinking restrictions.  Plan to have someone take you home from the hospital or clinic.  If you will be going home right after the procedure, plan to have someone with you for 24 hours.  Ask your health care provider: ? How your surgery site will be marked. ? What steps will be taken to help prevent infection. These may include:  Removing hair at the surgery site.  Washing skin with a germ-killing soap.  Taking antibiotic medicine. What happens during the procedure?  Small monitors will be put on your body. They will be used to check your heart, blood pressure, and oxygen level.  An IV will be inserted into one of your veins.  You will be given one or more of the following: ? A medicine to help you relax (sedative). ? A medicine to numb the area (local anesthetic).  A tool called a Doppler may be used to find the artery.  An incision will be made over the temporal artery.  Two clamps will be placed on the artery. Then, a small piece of the artery between the clamps will be cut and removed.  The remaining ends of the artery will be closed with stitches (sutures) to avoid bleeding. The clamps will then be removed.  The incision will be closed with sutures.  A bandage (dressing) may be placed on the incision.  The artery piece that was taken out will be checked under a microscope. The procedure may vary among health care providers and hospitals. What happens after the  procedure?  Your blood pressure, heart rate, breathing rate, and blood oxygen level may be monitored until you leave the hospital or clinic.  You may be given medicine for pain.  Do not drive for 24 hours if you were given a sedative during your procedure. Summary  During a temporal artery biopsy, a sample of the temporal artery is removed and examined under a microscope. This procedure may be done to see if you have a condition that causes your arteries to become swollen or inflamed (temporal arteritis or giant cell arteritis).  Before the procedure, follow instructions  from your health care provider about changing or stopping your medicines and about any eating or drinking restrictions.  Plan to have someone take you home after the procedure and have someone with you for 24 hours.  During the procedure, the incision will be closed with sutures, and a bandage (dressing) may be placed on the incision.   This information is not intended to replace advice given to you by your health care provider. Make sure you discuss any questions you have with your health care provider. Document Revised: 10/17/2017 Document Reviewed: 10/17/2017 Elsevier Patient Education  2020 [employer].
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
## 2019-10-30 NOTE — Progress Notes (Signed)
Internal Medicine Clinic Attending  I saw and evaluated the patient.  I personally confirmed the key portions of the history and exam documented by Dr. Watson and I reviewed pertinent patient test results.  The assessment, diagnosis, and plan were formulated together and I agree with the documentation in the resident's note.  

## 2019-11-02 LAB — CUP PACEART REMOTE DEVICE CHECK
Date Time Interrogation Session: 20210922233511
Implantable Pulse Generator Implant Date: 20190123

## 2019-11-03 ENCOUNTER — Ambulatory Visit (INDEPENDENT_AMBULATORY_CARE_PROVIDER_SITE_OTHER): Payer: 59 | Admitting: Emergency Medicine

## 2019-11-03 DIAGNOSIS — I639 Cerebral infarction, unspecified: Secondary | ICD-10-CM | POA: Diagnosis not present

## 2019-11-04 NOTE — Progress Notes (Signed)
Carelink Summary Report / Loop Recorder 

## 2019-11-06 ENCOUNTER — Telehealth: Payer: Self-pay | Admitting: *Deleted

## 2019-11-06 ENCOUNTER — Encounter: Payer: Self-pay | Admitting: General Practice

## 2019-11-06 NOTE — Telephone Encounter (Signed)
Dr. Mayford Knife will you call [place] at [facility]. She needs to know the reason for this referral.  It says one time on the referral and for pain in the note.

## 2019-11-10 ENCOUNTER — Ambulatory Visit (HOSPITAL_COMMUNITY): Admission: RE | Admit: 2019-11-10 | Payer: 59 | Source: Ambulatory Visit

## 2019-11-20 ENCOUNTER — Ambulatory Visit (INDEPENDENT_AMBULATORY_CARE_PROVIDER_SITE_OTHER): Payer: 59 | Admitting: Emergency Medicine

## 2019-11-20 ENCOUNTER — Other Ambulatory Visit: Payer: Self-pay

## 2019-11-20 DIAGNOSIS — Z8673 Personal history of transient ischemic attack (TIA), and cerebral infarction without residual deficits: Secondary | ICD-10-CM

## 2019-11-20 LAB — CUP PACEART INCLINIC DEVICE CHECK
Date Time Interrogation Session: 20211014163818
Implantable Pulse Generator Implant Date: 20190123

## 2019-11-20 NOTE — Progress Notes (Signed)
Patient came in for device clinic apt. Patient complains of painto left chest wall, worse with movement, tender on palpitation. States she also has pain in left arm that is worse with movement when she raises her arm up. Denies chest pain, jaw pain, left arm pain with radiation or other cardiac symptoms.  Reports of fall several years ago and has issues intermittently. Patient denies any pain at site of ILR. No redness, swelling or drainage noted. Patient reports of taking ASA for pain. Patient advised to follow-up with PCP regarding complaints.  Patient advised if she has any further questions or concerns to please call the device clinic and we will be happy to help her. Verbalized understanding. [place] interrupter present/assisting with conversations.

## 2019-11-24 NOTE — Progress Notes (Signed)
Office Visit Note  Patient: Kristen Short             Date of Birth: [birth year]           MRN: [id number]             PCP: Iona Beard, MD Referring: Angelica Pou, MD Visit Date: 11/25/2019   Subjective:  Pain of the Left Arm (Patient complains of left arm pain after stroke. /) and New Patient (Initial Visit)  The patient is primarily [place] speaking and history was obtained with assistance of interpreter.  History of Present Illness: Kristen Short is a 54 y.o. female here for evaluation of possible GCA.  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  She was seen at the internal medicine clinic on 10/14/19 at which time she was suffering right-sided headache and jaw pain with eating and blurry vision that raise concern for possible giant cell arteritis.  She was prescribed high-dose prednisone which she started taking but quickly discontinued because it caused severe upset stomach to the point that she vomited.  As a result she is not taking this medication for the past month.  Since that time she had improvement in her symptoms but continues to have intermittent headaches and pain affecting anywhere from her jaw to her ear.  Headaches extend from the front all the way to the rear base of her head.  She feels that the pain is worse over some severely damaged teeth.  She says these will hurt when food gets stuck against them and will then cause aching pain that sometimes radiates that can last for the entire rest of the day.  Sometimes this tooth pain is so severe she cannot sleep at night.  She says these problems on one side of the other date back many years to when she was in [place] with no access to any medical treatment for it.  She is not noticing any visual changes at this time.  She has residual left upper extremity neurologic deficits from her stroke but otherwise no proximal muscle soreness or weakness. Of  note she has numerous medication bottles with her today that are all empty and says she does not have any more of her medicines.  She requests please contact her primary care office about refills.   Labs reviewed 10/2019 ESR 47 Hgb A1c 8.0%  Activities of Daily Living:  Patient reports morning stiffness for 0 minutes.   Patient Reports nocturnal pain.  Difficulty dressing/grooming: Denies Difficulty climbing stairs: Denies Difficulty getting out of chair: Denies Difficulty using hands for taps, buttons, cutlery, and/or writing: Denies  Review of Systems  Constitutional: Positive for fatigue.  HENT: Negative for mouth sores, mouth dryness and nose dryness.   Eyes: Negative for pain, itching, visual disturbance and dryness.  Respiratory: Negative for cough, hemoptysis, shortness of breath and difficulty breathing.   Cardiovascular: Negative for chest pain and palpitations.  Gastrointestinal: Negative for abdominal pain, blood in stool, constipation and diarrhea.  Endocrine: Negative for increased urination.  Genitourinary: Negative for painful urination.  Musculoskeletal: Positive for arthralgias, joint pain, myalgias, muscle weakness, muscle tenderness and myalgias. Negative for joint swelling and morning stiffness.  Skin: Negative for color change, rash and redness.  Allergic/Immunologic: Negative for susceptible to infections.  Neurological: Positive for numbness, headaches and weakness. Negative for dizziness.  Hematological: Negative for swollen glands.  Psychiatric/Behavioral: Negative for sleep disturbance.    PMFS History:  Patient  Active Problem List   Diagnosis Date Noted  . Elevated sedimentation rate 11/25/2019  . Medication refill 11/25/2019  . Jaw pain 11/25/2019  . Problems in relationship with spouse or partner 10/14/2019  . History of loop recorder 10/14/2019  . Hypertension 08/14/2019  . Tension headache 07/15/2019  . 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 Left  shoulder pain 04/18/2017  . Left hemiparesis ([place])   . History of ischemic right MCA stroke 03/05/2017  . Cognitive deficit, post-stroke   . Diabetes ([place])   . Hyperlipidemia     Past Medical History:  Diagnosis Date  . AKI (acute kidney injury) ([place])   . 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 CVA (cerebral vascular accident) ([place])   . Itching   . Left hemiplegia ([place])   . Left wrist fracture   . Slow transit constipation     Family History  Problem Relation Age of Onset  . High blood pressure Sister   . Heart Problems Sister        angina due to vasospasms  . Stroke Sister   . Stroke Father   . Stroke Maternal Grandmother    Past Surgical History:  Procedure Laterality Date  . LOOP RECORDER INSERTION N/A 03/05/2017   Procedure: LOOP RECORDER INSERTION;  Surgeon: Evans Lance, MD;  Location: [facility];  Service: Cardiovascular;  Laterality: N/A;  . TEE WITHOUT CARDIOVERSION N/A 03/05/2017   Procedure: TRANSESOPHAGEAL ECHOCARDIOGRAM (TEE);  Surgeon: Josue Hector, MD;  Location: [facility] ENDOSCOPY;  Service: Cardiovascular;  Laterality: N/A;   Social History   Social History Narrative  . Not on file    There is no immunization history on file for this patient.   Objective: Vital Signs: BP 129/68 (BP Location: Right Arm, Patient Position: Sitting, Cuff Size: Small)   Pulse 83   Ht 5' (1.524 m)   Wt 157 lb (71.2 kg)   LMP  (LMP Unknown)   BMI 30.66 kg/m    Physical Exam  HENT:     Head:     Comments: Reports tenderness to pressure on scattered spots on top of right half of scalp.  No tenderness to pressure over frontotemporal areas bilaterally.    Right Ear: External ear normal.     Left Ear: External ear normal.     Mouth/Throat:     Mouth: Mucous membranes are moist.     Pharynx: Oropharynx is clear.     Comments: Caries in multiple teeth and looks like retained tooth fragment or root in left upper mouth, no obvious abscess or inflammation Normal oral  aperture size Eyes:     Conjunctiva/sclera: Conjunctivae normal.     Pupils: Pupils are equal, round, and reactive to light.  Cardiovascular:     Rate and Rhythm: Normal rate and regular rhythm.  Pulmonary:     Effort: Pulmonary effort is normal.     Breath sounds: Normal breath sounds.  Musculoskeletal:        General: No swelling or tenderness.  Neurological:     Comments: Left hand limited ability to close tight grip especially the third through fifth digits  CDAI Exam: CDAI Score: -- Patient Global: --; Provider Global: -- Swollen: --; Tender: -- Joint Exam 11/25/2019   No joint exam has been documented for this visit   There is currently no information documented on the homunculus. Go to the Rheumatology activity and complete the homunculus joint exam.  Investigation: No additional findings.  Imaging: [place]  Result Date: 11/20/2019 Patient came in for device clinic apt. Patient complains of pain to left chest wall, worse with movement, tender on palpitation. States she also has pain in left arm that is worse with movement when she raises her arm up. Denies chest pain, jaw pain, left arm pain with radiation or other cardiac symptoms.  Reports of fall several years ago and has issues intermittently. Patient denies any pain at site of ILR. No redness, swelling or drainage noted. Patient reports of taking ASA for pain. Patient advised to follow-up with PCP regarding complaints.  Patient advised if she has any further questions or concerns to please call the device clinic and we will be happy to help her. Verbalized understanding. [place] interrupter present/assisting with conversations.Lavenia Atlas, BSN, RN  CUP PACEART REMOTE DEVICE CHECK  Result Date: 11/02/2019 ILR summary report received. Battery status OK. Normal device function. No new symptom episodes, tachy episodes, brady, or pause episodes. No new AF episodes. Monthly summary reports and  ROV/PRN JM   Recent Labs: Lab Results  Component Value Date   WBC 7.0 03/26/2019   HGB 13.9 03/26/2019   PLT 211 03/26/2019   NA 140 08/14/2019   K 4.1 08/14/2019   CL 105 08/14/2019   CO2 22 08/14/2019   GLUCOSE 119 (H) 08/14/2019   BUN 12 08/14/2019   CREATININE 0.75 08/14/2019   BILITOT 0.5 03/06/2017   ALKPHOS 69 03/06/2017   AST 27 03/06/2017   ALT 19 03/06/2017   PROT 7.7 03/06/2017   ALBUMIN 3.5 03/06/2017   CALCIUM 9.7 08/14/2019   GFRAA 105 08/14/2019    Speciality Comments: No specialty comments available.  Procedures:  No procedures performed Allergies: Prednisone and Synjardy [empagliflozin-metformin hcl]   Assessment / Plan:     Visit Diagnoses: Elevated sedimentation rate - Plan: Sedimentation rate, C-reactive protein  Elevated ESR is nonspecific for inflammation.  However I will repeat this today along with CRP to monitor since they should be useful with her not taking any prednisone for about a month.  Medication refill  Patient reports needing medication refills and had multiple empty bottles brought with her to the encounter today.  Some of these appear to have actual refills on them but maybe she is unaware of how to properly contact the pharmacy.  I sent a message to her primary care office since she would definitely benefit from staying on these treatments such as her aspirin and statin given the previous stroke.  Jaw pain  She has the jaw pain but on thorough questioning does not sound consistent with claudication.  The pain does onset with eating but typically lasts for many hours or even a day afterwards and is also active at night.  She also states the pain has been present for several years dating back to before she moved from [place].  I suspect her poor dentition with multiple caries and partial tooth/root is retained are the source of pain.  Headache  She has headache extends all the way from frontal to the occiput.  This sounds more  consistent with a migraine or a tension headache than with arteritis.  The jaw problems could also be provocative of her pain.  She is also describes being off of the cyclobenzaprine or amitriptyline that may have been treating for this causing increased pain.  In addition to talk about refills for this I discussed as needed Tylenol may be useful for her symptoms but recommended abstaining from NSAIDs due to her fairly recent  stroke aspirin use and other cardiovascular risks.  Orders: Orders Placed This Encounter  Procedures  . Sedimentation rate  . C-reactive protein   No orders of the defined types were placed in this encounter.    Follow-Up Instructions: No follow-ups on file.   Collier Salina, MD  Note - This record has been created using [employer].  Chart creation errors have been sought, but may not always  have been located. Such creation errors do not reflect on  the standard of medical care.

## 2019-11-25 ENCOUNTER — Other Ambulatory Visit: Payer: Self-pay | Admitting: Student

## 2019-11-25 ENCOUNTER — Ambulatory Visit: Payer: 59 | Admitting: Internal Medicine

## 2019-11-25 ENCOUNTER — Encounter: Payer: Self-pay | Admitting: Internal Medicine

## 2019-11-25 ENCOUNTER — Other Ambulatory Visit: Payer: Self-pay

## 2019-11-25 ENCOUNTER — Ambulatory Visit: Payer: 59 | Admitting: Vascular Surgery

## 2019-11-25 ENCOUNTER — Encounter: Payer: Self-pay | Admitting: Vascular Surgery

## 2019-11-25 VITALS — BP 129/68 | HR 83 | Ht 60.0 in | Wt 157.0 lb

## 2019-11-25 DIAGNOSIS — G43009 Migraine without aura, not intractable, without status migrainosus: Secondary | ICD-10-CM | POA: Diagnosis not present

## 2019-11-25 DIAGNOSIS — G44209 Tension-type headache, unspecified, not intractable: Secondary | ICD-10-CM

## 2019-11-25 DIAGNOSIS — R6884 Jaw pain: Secondary | ICD-10-CM | POA: Insufficient documentation

## 2019-11-25 DIAGNOSIS — R519 Headache, unspecified: Secondary | ICD-10-CM

## 2019-11-25 DIAGNOSIS — R700 Elevated erythrocyte sedimentation rate: Secondary | ICD-10-CM | POA: Diagnosis not present

## 2019-11-25 DIAGNOSIS — Z760 Encounter for issue of repeat prescription: Secondary | ICD-10-CM | POA: Diagnosis not present

## 2019-11-25 MED FILL — METFORMIN HCL 500 MG TABS: 500 | 30 days supply | Qty: 30 | Fill #1

## 2019-11-25 MED FILL — ATORVASTATIN 80 MG TABLET: 80 | 30 days supply | Qty: 30 | Fill #1

## 2019-11-25 MED FILL — LISINOPRIL 20 MG TABLET: 20 | 30 days supply | Qty: 30 | Fill #1

## 2019-11-25 MED FILL — AMITRIPTYLINE HCL 10 MG TAB: 10 | 30 days supply | Qty: 30 | Fill #1

## 2019-11-25 NOTE — Progress Notes (Signed)
Patient name: Kristen Short MRN: [id number] DOB: 1965-03-14 Sex: female  REASON FOR VISIT: Temporal Artery biopsy  HPI: Kristen Short is a 54 y.o. female with history of right MCA stroke, diabetes, hypertension, hyperlipidemia referred for temporal artery biopsy.  Most of the history is obtained through interpreter.  She describes about 3 to 4 years of headache mostly on the right side.  Ultimately she has been seen in [facility] health internal medicine resident clinic and subsequently referred for temporal artery biopsy per Dr. Jake Bathe documentation.  Apparently she did try steroids briefly for about 2 weeks but then developed nausea vomiting and abdominal pain and had to stop this.  Her headaches have been ongoing.  She does endorse this history of stroke with some left arm weakness in the past.  Past Medical History:  Diagnosis Date  . AKI (acute kidney injury) ([facility])   . 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 CVA (cerebral vascular accident) ([facility])   . Itching   . Left hemiplegia ([facility])   . Left wrist fracture   . Slow transit constipation     Past Surgical History:  Procedure Laterality Date  . LOOP RECORDER INSERTION N/A 03/05/2017   Procedure: LOOP RECORDER INSERTION;  Surgeon: Marinus Maw, MD;  Location[facility] INVASIVE CV LAB;  Service: Cardiovascular;  Laterality: N/A;  . TEE WITHOUT CARDIOVERSION N/A 03/05/2017   Procedure: TRANSESOPHAGEAL ECHOCARDIOGRAM (TEE);  Surgeon: Wendall Stade, MD;  Location: [facility] ENDOSCOPY;  Service: Cardiovascular;  Laterality: N/A;    Family History  Problem Relation Age of Onset  . High blood pressure Sister   . Heart Problems Sister        angina due to vasospasms  . Stroke Sister   . Stroke Father   . Stroke Maternal Grandmother     SOCIAL HISTORY: Social History   Tobacco Use  . Smoking status: Never Smoker  . Smokeless tobacco: Never Used  Substance Use Topics  . Alcohol use: No    Allergies  Allergen Reactions  . Prednisone Nausea  Only    Patient complains of Prednisone causing an upset stomach and nausea.   Marland Kitchen Synjardy [Empagliflozin-Metformin Hcl] Other (See Comments)    Abdominal pain Palpitations     Current Outpatient Medications  Medication Sig Dispense Refill  . amitriptyline (ELAVIL) 10 MG tablet Take 1 tablet (10 mg total) by mouth at bedtime. 30 tablet 3  . aspirin EC 325 MG tablet Take 1 tablet (325 mg total) by mouth daily. 90 tablet 0  . atorvastatin (LIPITOR) 80 MG tablet Take 1 tablet (80 mg total) by mouth daily. 90 tablet 1  . cyclobenzaprine (FLEXERIL) 5 MG tablet Take 1 tablet (5 mg total) by mouth 3 (three) times daily as needed for muscle spasms. 30 tablet 0  . lisinopril (ZESTRIL) 20 MG tablet Take 1 tablet (20 mg total) by mouth daily. 30 tablet 11  . meloxicam (MOBIC) 7.5 MG tablet Take 7.5 mg by mouth daily.    . metFORMIN (GLUCOPHAGE) 500 MG tablet Take 1 tablet (500 mg total) by mouth daily with breakfast. 30 tablet 11   No current facility-administered medications for this visit.    REVIEW OF SYSTEMS:  [X]  denotes positive finding, [ ]  denotes negative finding Cardiac  Comments:  Chest pain or chest pressure:    Shortness of breath upon exertion:    Short of breath when lying flat:    Irregular heart rhythm:        Vascular  Pain in calf, thigh, or hip brought on by ambulation:    Pain in feet at night that wakes you up from your sleep:     Blood clot in your veins:    Leg swelling:         Pulmonary    Oxygen at home:    Productive cough:     Wheezing:         Neurologic    Sudden weakness in arms or legs:     Sudden numbness in arms or legs:     Sudden onset of difficulty speaking or slurred speech:    Temporary loss of vision in one eye:     Problems with dizziness:         Gastrointestinal    Blood in stool:     Vomited blood:         Genitourinary    Burning when urinating:     Blood in urine:        Psychiatric    Major depression:         Hematologic     Bleeding problems:    Problems with blood clotting too easily:        Skin    Rashes or ulcers:        Constitutional    Fever or chills:      PHYSICAL EXAM: Vitals:   11/25/19 1524 11/25/19 1535  BP: (!) 138/95 (!) 141/94  Pulse: 89 66  Resp: 14   Temp: 98 F (36.7 C)   TempSrc: Temporal   SpO2: 99%   Weight: 154 lb (69.9 kg)   Height: 4\' 11"  (1.499 m)     GENERAL: The patient is a well-nourished female, in no acute distress. The vital signs are documented above. CARDIAC: There is a regular rate and rhythm.  VASCULAR:  Difficultt to appreciate right temporal pulse but most of this was cooperation and language barrier with interpretor   DATA:   None  Assessment/Plan:  54 year old female referred for temporal artery biopsy in the setting of chronic right-sided headaches.  I discussed our role as vascular surgeons to do temporal artery biopsy as outpatient surgery at [facility].  Discussed that these results would go to [facility] internal medicine residency clinic and to [facility] who could then decide on long term treatment options.  Risks and benefits were discussed.  Ultimately patient states she is not interested in surgery and does not want be scheduled for a biopsy.  I gave her my contact information and discussed she contact our office in the future if she changes her mind.   Claudette Laws, MD [facility] Office: [phone]

## 2019-11-25 NOTE — Patient Instructions (Signed)
For jaw pain you may take Tylenol (acetaminophen) as needed up to 3000 mg maximum per day  I recommend avoiding ibuprofen, aleve, motrin, or similar drugs with your recent stroke  I do not think you need to take the prednisone, we will contact you if lab results are different than expected  I will send a message to your Internal Medicine clinic about refilling medication and our visit today

## 2019-11-26 LAB — C-REACTIVE PROTEIN: CRP: 1.8 mg/L (ref <8.0)

## 2019-11-26 LAB — SEDIMENTATION RATE: Sed Rate: 11 mm/h (ref 0–30)

## 2019-11-26 NOTE — Progress Notes (Signed)
Labs show normal inflammatory markers these resolved compared to 1 month ago. Based on this I think she does not need to take steroids. I will send a message to her primary care office about my recommendations and her needing refills.

## 2019-12-06 LAB — CUP PACEART REMOTE DEVICE CHECK
Date Time Interrogation Session: 20211026000123
Implantable Pulse Generator Implant Date: 20190123

## 2019-12-08 ENCOUNTER — Ambulatory Visit (INDEPENDENT_AMBULATORY_CARE_PROVIDER_SITE_OTHER): Payer: 59

## 2019-12-08 DIAGNOSIS — I639 Cerebral infarction, unspecified: Secondary | ICD-10-CM

## 2019-12-10 NOTE — Progress Notes (Signed)
Carelink Summary Report / Loop Recorder 

## 2020-01-09 LAB — CUP PACEART REMOTE DEVICE CHECK
Date Time Interrogation Session: 20211127231243
Implantable Pulse Generator Implant Date: 20190123

## 2020-01-12 ENCOUNTER — Ambulatory Visit (INDEPENDENT_AMBULATORY_CARE_PROVIDER_SITE_OTHER): Payer: 59

## 2020-01-12 DIAGNOSIS — I639 Cerebral infarction, unspecified: Secondary | ICD-10-CM

## 2020-01-21 NOTE — Progress Notes (Signed)
Carelink Summary Report / Loop Recorder 

## 2020-02-15 LAB — CUP PACEART REMOTE DEVICE CHECK
Date Time Interrogation Session: 20220108231145
Implantable Pulse Generator Implant Date: 20190123

## 2020-02-16 ENCOUNTER — Ambulatory Visit (INDEPENDENT_AMBULATORY_CARE_PROVIDER_SITE_OTHER): Payer: 59

## 2020-02-16 DIAGNOSIS — Z8673 Personal history of transient ischemic attack (TIA), and cerebral infarction without residual deficits: Secondary | ICD-10-CM

## 2020-02-29 NOTE — Progress Notes (Signed)
Carelink Summary Report / Loop Recorder 

## 2020-03-09 DEATH — deceased

## 2020-05-01 IMAGING — DX DG CHEST 2V
2 series · 2 of 2 positions shown · non-contrast
Comparison: 02/28/2017 chest radiograph.

CLINICAL DATA: Chest pain

EXAM:
CHEST - 2 VIEW

[w chest pa]
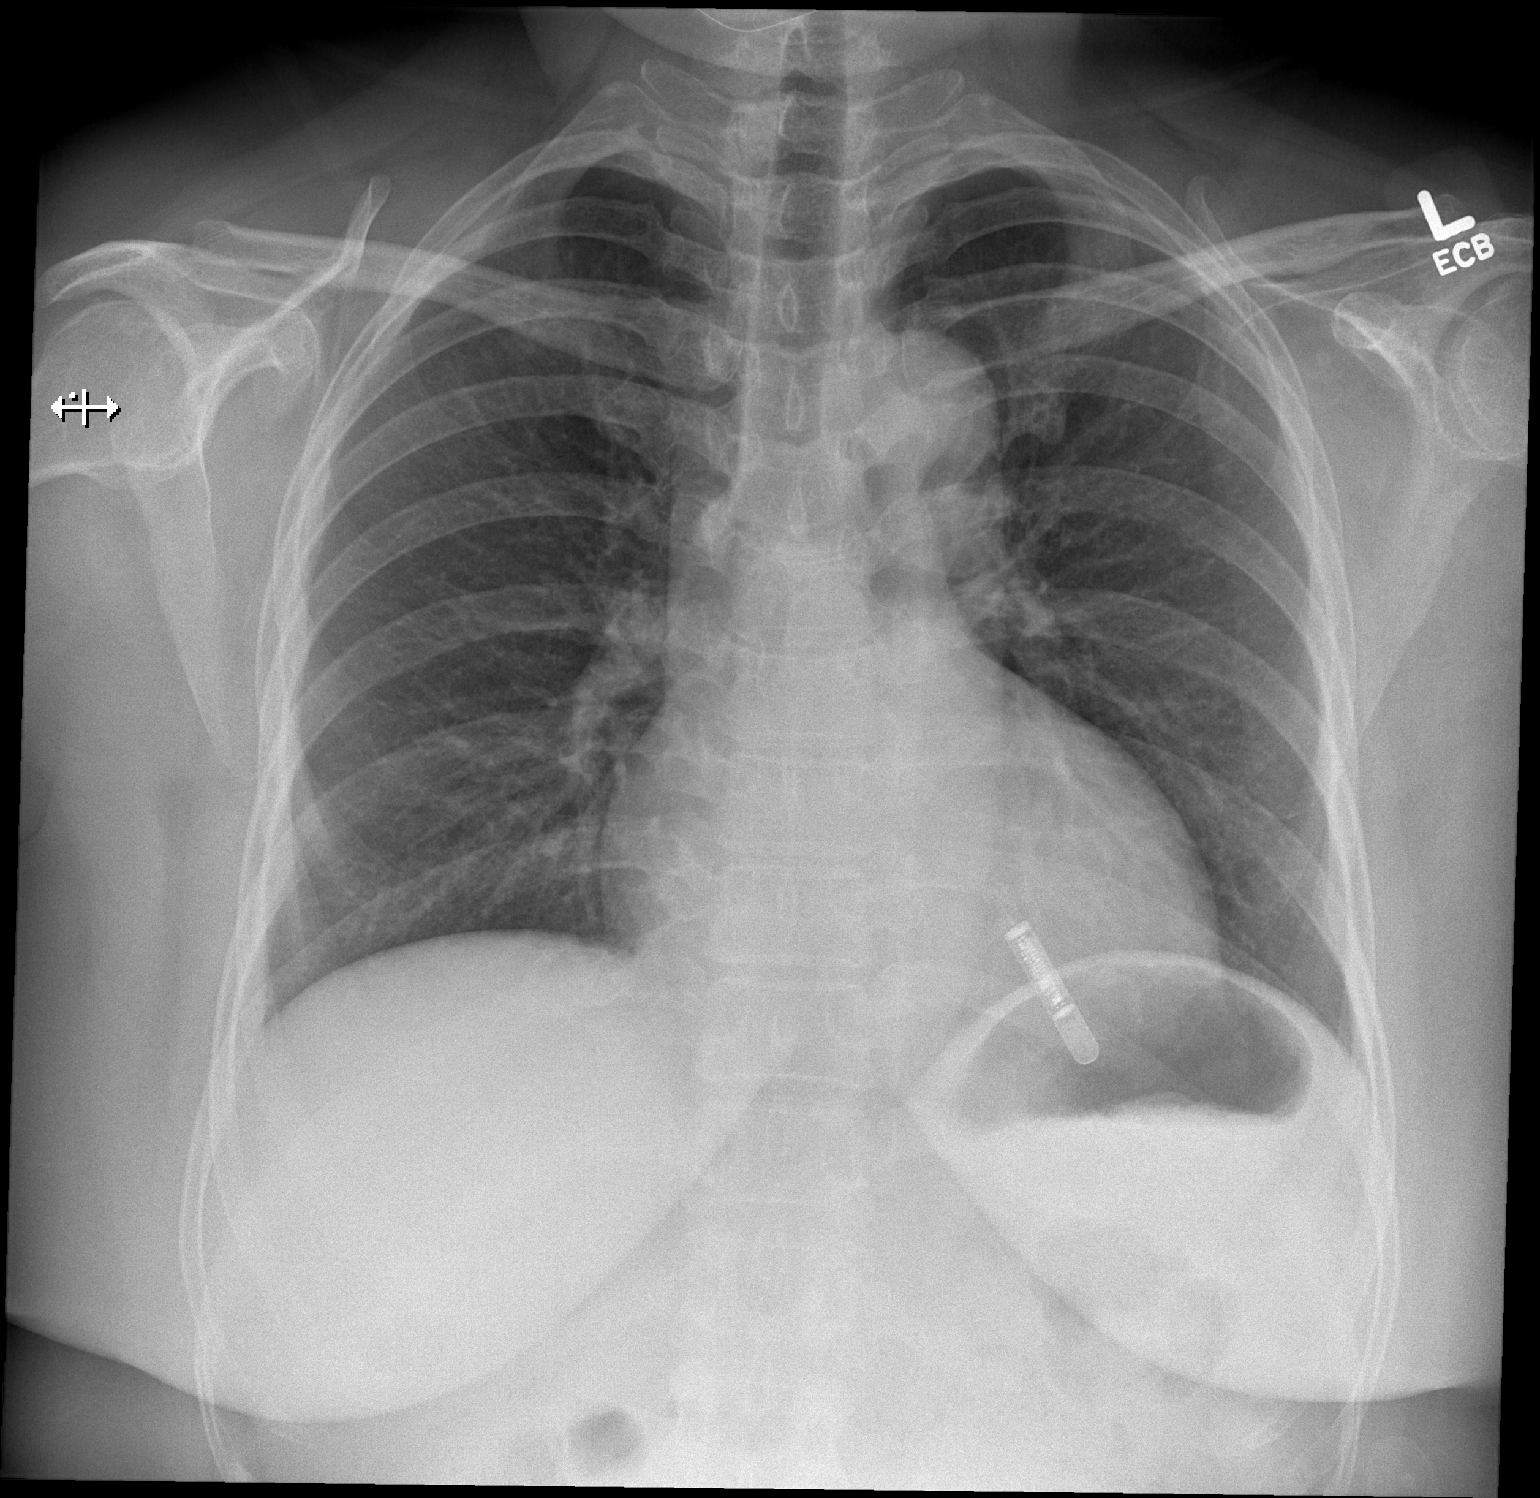

[w chest lat]
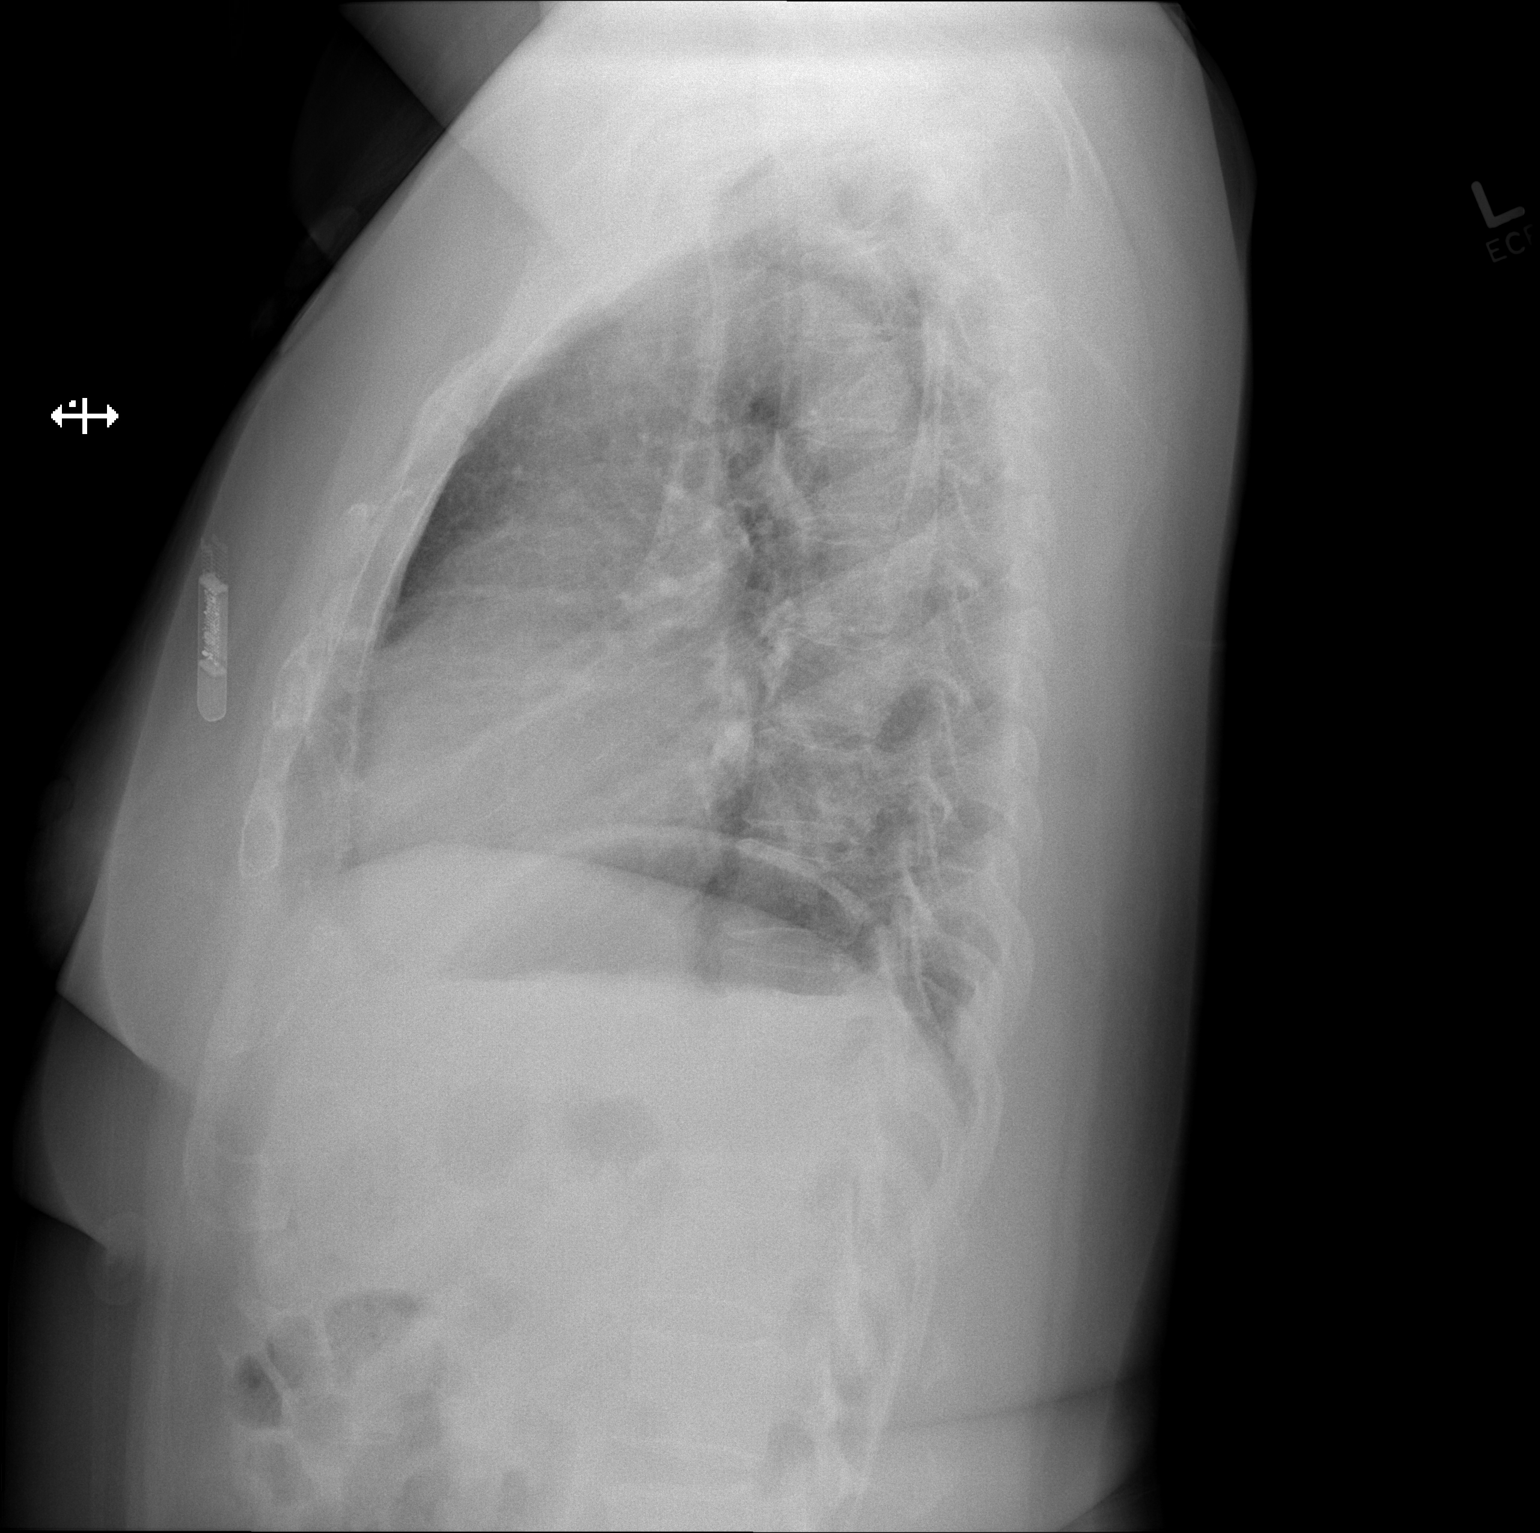

[2 of 2 positions shown; findings below may reference images not displayed]

FINDINGS: Loop recorder overlies the left heart. Stable cardiomediastinal
silhouette with top-normal heart size. No pneumothorax. No pleural
effusion. Lungs appear clear, with no acute consolidative airspace
disease and no pulmonary edema.
IMPRESSION: No active cardiopulmonary disease.
